# Patient Record
Sex: Male | Born: 1960 | Race: White | Hispanic: No | Marital: Married | Smoking: Never smoker
Health system: Southern US, Community
[De-identification: ages and names within clinical notes are randomized; demographics above are authoritative.]

## PROBLEM LIST (undated history)

## (undated) DIAGNOSIS — G510 Bell's palsy: Secondary | ICD-10-CM

## (undated) DIAGNOSIS — E079 Disorder of thyroid, unspecified: Secondary | ICD-10-CM

---

## 2015-04-13 ENCOUNTER — Emergency Department (HOSPITAL_COMMUNITY): Payer: BLUE CROSS/BLUE SHIELD

## 2015-04-13 ENCOUNTER — Observation Stay (HOSPITAL_COMMUNITY)
Admission: EM | Admit: 2015-04-13 | Discharge: 2015-04-15 | Disposition: A | Discharge status: Home / Self Care | Payer: BLUE CROSS/BLUE SHIELD | Attending: Internal Medicine | Admitting: Internal Medicine

## 2015-04-13 ENCOUNTER — Encounter (HOSPITAL_COMMUNITY): Payer: Self-pay | Admitting: Emergency Medicine

## 2015-04-13 DIAGNOSIS — I10 Essential (primary) hypertension: Secondary | ICD-10-CM | POA: Insufficient documentation

## 2015-04-13 DIAGNOSIS — W19XXXA Unspecified fall, initial encounter: Secondary | ICD-10-CM | POA: Diagnosis not present

## 2015-04-13 DIAGNOSIS — K644 Residual hemorrhoidal skin tags: Secondary | ICD-10-CM | POA: Diagnosis not present

## 2015-04-13 DIAGNOSIS — R739 Hyperglycemia, unspecified: Secondary | ICD-10-CM | POA: Insufficient documentation

## 2015-04-13 DIAGNOSIS — R55 Syncope and collapse: Principal | ICD-10-CM | POA: Insufficient documentation

## 2015-04-13 DIAGNOSIS — R680 Hypothermia, not associated with low environmental temperature: Secondary | ICD-10-CM | POA: Diagnosis not present

## 2015-04-13 DIAGNOSIS — J189 Pneumonia, unspecified organism: Secondary | ICD-10-CM | POA: Diagnosis not present

## 2015-04-13 DIAGNOSIS — E785 Hyperlipidemia, unspecified: Secondary | ICD-10-CM | POA: Insufficient documentation

## 2015-04-13 DIAGNOSIS — R4781 Slurred speech: Secondary | ICD-10-CM | POA: Insufficient documentation

## 2015-04-13 DIAGNOSIS — R531 Weakness: Secondary | ICD-10-CM | POA: Insufficient documentation

## 2015-04-13 DIAGNOSIS — G459 Transient cerebral ischemic attack, unspecified: Secondary | ICD-10-CM

## 2015-04-13 HISTORY — DX: Bell's palsy: G51.0

## 2015-04-13 LAB — URINALYSIS, ROUTINE W REFLEX MICROSCOPIC
Bilirubin Urine: NEGATIVE
Glucose, UA: 100 mg/dL — AB
KETONES UR: NEGATIVE mg/dL
LEUKOCYTES UA: NEGATIVE
NITRITE: NEGATIVE
Specific Gravity, Urine: 1.015 (ref 1.005–1.030)
pH: 6 (ref 5.0–8.0)

## 2015-04-13 LAB — CBC WITH DIFFERENTIAL/PLATELET
BASOS ABS: 0.1 10*3/uL (ref 0.0–0.1)
BASOS PCT: 1 %
Eosinophils Absolute: 0.2 10*3/uL (ref 0.0–0.7)
Eosinophils Relative: 1 %
HEMATOCRIT: 42 % (ref 39.0–52.0)
HEMOGLOBIN: 14.2 g/dL (ref 13.0–17.0)
Lymphocytes Relative: 28 %
Lymphs Abs: 3.9 10*3/uL (ref 0.7–4.0)
MCH: 31.1 pg (ref 26.0–34.0)
MCHC: 33.8 g/dL (ref 30.0–36.0)
MCV: 91.9 fL (ref 78.0–100.0)
Monocytes Absolute: 0.5 10*3/uL (ref 0.1–1.0)
Monocytes Relative: 4 %
NEUTROS ABS: 9.3 10*3/uL — AB (ref 1.7–7.7)
NEUTROS PCT: 67 %
Platelets: 237 10*3/uL (ref 150–400)
RBC: 4.57 MIL/uL (ref 4.22–5.81)
RDW: 14.2 % (ref 11.5–15.5)
WBC: 13.9 10*3/uL — AB (ref 4.0–10.5)

## 2015-04-13 LAB — COMPREHENSIVE METABOLIC PANEL
ALBUMIN: 4.1 g/dL (ref 3.5–5.0)
ALT: 52 U/L (ref 17–63)
ALT: 54 U/L (ref 17–63)
ANION GAP: 9 (ref 5–15)
AST: 60 U/L — AB (ref 15–41)
AST: 145 U/L — AB (ref 15–41)
Albumin: 4.3 g/dL (ref 3.5–5.0)
Alkaline Phosphatase: 44 U/L (ref 38–126)
Alkaline Phosphatase: 40 U/L (ref 38–126)
Anion gap: 13 (ref 5–15)
BILIRUBIN TOTAL: 0.6 mg/dL (ref 0.3–1.2)
BUN: 19 mg/dL (ref 6–20)
BUN: 21 mg/dL — AB (ref 6–20)
CALCIUM: 8.9 mg/dL (ref 8.9–10.3)
CHLORIDE: 101 mmol/L (ref 101–111)
CO2: 21 mmol/L — ABNORMAL LOW (ref 22–32)
CO2: 26 mmol/L (ref 22–32)
Calcium: 8.8 mg/dL — ABNORMAL LOW (ref 8.9–10.3)
Chloride: 103 mmol/L (ref 101–111)
Creatinine, Ser: 1.29 mg/dL — ABNORMAL HIGH (ref 0.61–1.24)
Creatinine, Ser: 1.2 mg/dL (ref 0.61–1.24)
GFR calc Af Amer: >60 mL/min (ref >60)
GFR calc Af Amer: >60 mL/min (ref >60)
GFR calc non Af Amer: >60 mL/min (ref >60)
GLUCOSE: 204 mg/dL — AB (ref 65–99)
GLUCOSE: 120 mg/dL — AB (ref 65–99)
POTASSIUM: 4.2 mmol/L (ref 3.5–5.1)
Potassium: 4.1 mmol/L (ref 3.5–5.1)
SODIUM: 136 mmol/L (ref 135–145)
Sodium: 137 mmol/L (ref 135–145)
TOTAL PROTEIN: 6.6 g/dL (ref 6.5–8.1)
Total Bilirubin: 0.9 mg/dL (ref 0.3–1.2)
Total Protein: 6.3 g/dL — ABNORMAL LOW (ref 6.5–8.1)

## 2015-04-13 LAB — ETHANOL: Alcohol, Ethyl (B): <5 mg/dL (ref <5)

## 2015-04-13 LAB — I-STAT CHEM 8, ED
BUN: 23 mg/dL — ABNORMAL HIGH (ref 6–20)
Calcium, Ion: 1.03 mmol/L — ABNORMAL LOW (ref 1.12–1.23)
Chloride: 102 mmol/L (ref 101–111)
Creatinine, Ser: 1.2 mg/dL (ref 0.61–1.24)
Glucose, Bld: 196 mg/dL — ABNORMAL HIGH (ref 65–99)
HEMATOCRIT: 44 % (ref 39.0–52.0)
HEMOGLOBIN: 15 g/dL (ref 13.0–17.0)
Potassium: 3.9 mmol/L (ref 3.5–5.1)
SODIUM: 137 mmol/L (ref 135–145)
TCO2: 22 mmol/L (ref 0–100)

## 2015-04-13 LAB — CBC
HCT: 38.6 % — ABNORMAL LOW (ref 39.0–52.0)
Hemoglobin: 13.1 g/dL (ref 13.0–17.0)
MCH: 30.8 pg (ref 26.0–34.0)
MCHC: 33.9 g/dL (ref 30.0–36.0)
MCV: 90.6 fL (ref 78.0–100.0)
Platelets: 188 10*3/uL (ref 150–400)
RBC: 4.26 MIL/uL (ref 4.22–5.81)
RDW: 14.1 % (ref 11.5–15.5)
WBC: 15.4 10*3/uL — AB (ref 4.0–10.5)

## 2015-04-13 LAB — URINE MICROSCOPIC-ADD ON: Bacteria, UA: NONE SEEN

## 2015-04-13 LAB — RAPID URINE DRUG SCREEN, HOSP PERFORMED
Amphetamines: NOT DETECTED
BARBITURATES: NOT DETECTED
Benzodiazepines: NOT DETECTED
Cocaine: NOT DETECTED
Opiates: NOT DETECTED
TETRAHYDROCANNABINOL: NOT DETECTED

## 2015-04-13 LAB — D-DIMER, QUANTITATIVE: D-Dimer, Quant: 0.28 ug/mL-FEU (ref 0.00–0.50)

## 2015-04-13 LAB — I-STAT TROPONIN, ED: Troponin i, poc: 0.05 ng/mL (ref 0.00–0.08)

## 2015-04-13 LAB — APTT: aPTT: 32 seconds (ref 24–37)

## 2015-04-13 LAB — PROTIME-INR
INR: 1.16 (ref 0.00–1.49)
PROTHROMBIN TIME: 15 s (ref 11.6–15.2)

## 2015-04-13 LAB — CBG MONITORING, ED: Glucose-Capillary: 144 mg/dL — ABNORMAL HIGH (ref 65–99)

## 2015-04-13 MED ORDER — AZITHROMYCIN 500 MG PO TABS
500.0000 mg | ORAL_TABLET | ORAL | Status: DC
  Administered 2015-04-13 – 2015-04-14 (×2): 500 mg via ORAL
  Filled 2015-04-13 (×2): qty 1

## 2015-04-13 MED ORDER — ONDANSETRON HCL 4 MG/2ML IJ SOLN
4.0000 mg | Freq: Once | INTRAMUSCULAR | Status: AC
  Administered 2015-04-13: 4 mg via INTRAVENOUS
  Filled 2015-04-13: qty 2

## 2015-04-13 MED ORDER — LORAZEPAM 2 MG/ML IJ SOLN
0.5000 mg | Freq: Once | INTRAMUSCULAR | Status: AC
  Administered 2015-04-13: 0.5 mg via INTRAVENOUS
  Filled 2015-04-13: qty 1

## 2015-04-13 MED ORDER — ACETAMINOPHEN 650 MG RE SUPP: 650.0000 mg | RECTAL | Status: DC | PRN

## 2015-04-13 MED ORDER — ACETAMINOPHEN 325 MG PO TABS: 650.0000 mg | ORAL_TABLET | ORAL | Status: DC | PRN

## 2015-04-13 MED ORDER — ASPIRIN 325 MG PO TABS
325.0000 mg | ORAL_TABLET | Freq: Every day | ORAL | Status: DC
  Administered 2015-04-14 – 2015-04-15 (×3): 325 mg via ORAL
  Filled 2015-04-13 (×3): qty 1

## 2015-04-13 MED ORDER — ENOXAPARIN SODIUM 40 MG/0.4ML ~~LOC~~ SOLN
40.0000 mg | SUBCUTANEOUS | Status: DC
  Administered 2015-04-13 – 2015-04-14 (×2): 40 mg via SUBCUTANEOUS
  Filled 2015-04-13 (×2): qty 0.4

## 2015-04-13 MED ORDER — DEXTROSE 5 % IV SOLN
1.0000 g | INTRAVENOUS | Status: DC
  Administered 2015-04-14 (×2): 1 g via INTRAVENOUS
  Filled 2015-04-13 (×3): qty 10

## 2015-04-13 MED ORDER — ASPIRIN 300 MG RE SUPP: 300.0000 mg | Freq: Every day | RECTAL | Status: DC

## 2015-04-13 MED ORDER — STROKE: EARLY STAGES OF RECOVERY BOOK
Freq: Once | Status: AC
  Administered 2015-04-13: 22:00:00
  Filled 2015-04-13: qty 1

## 2015-04-13 MED ORDER — SODIUM CHLORIDE 0.9 % IV BOLUS (SEPSIS)
1000.0000 mL | Freq: Once | INTRAVENOUS | Status: AC
  Administered 2015-04-14: 1000 mL via INTRAVENOUS

## 2015-04-13 NOTE — H&P (Signed)
History and Physical  Patient Name: Connor Vargas     ZOX:[id number]    DOB: 1960-09-06    DOA: 04/13/2015 Referring physician: Stacy Gardner, MD PCP: Kaleen Mask, MD      Chief Complaint: Syncope and L sided weakness transient  HPI: Connor Vargas is a 55 y.o. male with no past medical history significant who presents with fall.  Translator is deferred and all history is collected from the patient or through a family friend, who is a hospitalist. The patient is healthy at baseline but notes that he has spells occasionally, over >1 year of exertional weakness, starting at the extremities and progressing to global weakness.  These are not associated with chest pressure/discomfort, dyspnea, or palpitations, but are relieved with rest.  They appear to be accelerating in frequency.  1 month ago, he had an episode and lost consciousness completely (there was prodrome of feeling weak, then he blacked out, and came back shortly after).  Today, he had had a heavy breakfast and felt some abdominal discomfort/bloating/gas.  He went out to the car to clear off snow, and started to get weak, then the next thing he remembers is waking up calling for his wife.  Wife reports ~15 minutes before he was seen, and called EMS.  When EMS arrived, he was still down, had L sided leg and arm weakness, and slurred speech and blood in nares.  Was hypothermic and transported to ED>  In the ED, the patient was hypertensive and hypothermic. His strength was symmetric on arrival. He had leukocytosis, and chest x-ray with questionable R sided infiltrate, and had brief desaturation into 80s.  A CT head was negative, and patient was seen by Neurology, who recommended stroke work up for transient reported L sided weakness.  He denies cough, fever before today, sputum, chills, dyspnea.  Patient takes no medicines at baseline, but has medical follow up.  No previous allergies.  Mother with premature CAD.       Review of  Systems:  Pt complains of weakness, nausea, abdominal discomfort, bloating, fainting spells, increasing in frequency.  Initially complained of L sided weakness.   Pt denies any dyspnea, cough, sputum.  Denies palpitations, chest discomfort.  All other systems negative except as just noted or noted in the history of present illness.   Allergies: No Known Allergies  Home medications: Acetaminophen occasionally  Past medical history: 1. Bell's palsy, years ago  Past surgical history: None  Family history:  Mother, deceased from MI age 53.    Social History:  Patient lives with his wife and son.  No recent sick contacts.  Works in [employer].  From [place], speaks some English.  Non-smoker.  Occasional alcohol.        Physical Exam: BP 134/86 mmHg  Pulse 81  Temp(Src) 97.6 F (36.4 C) (Rectal)  Resp 12  SpO2 97% General appearance: Well-developed, adult male, alert and in no acute distress but tired.   Eyes: Anicteric, conjunctiva pink, lids and lashes normal.     ENT: No nasal deformity, discharge, or epistaxis at this time.  OP moist without lesions.   Skin: Warm and dry.  Dry flaky skin.  No jaundice.  No suspicious rashes or lesions on face, neck, chest, abdomen, arms, legs, back. Cardiac: RRR, nl S1-S2, no murmurs appreciated.  Capillary refill is brisk.  No JVD.  Radial pulses 2+ and symmetric. Respiratory: Normal respiratory rate and rhythm.  CTAB without rales or wheezes. Abdomen: Abdomen soft  without rigidity.  No TTP or guarding.  No HSM. No ascites, distension.   MSK: No deformities or effusions. Neuro: Sensorium intact and responding to questions, attention normal.  Speech appears fluent.  Moves all extremities equally and with normal coordination.   Cranial nerves normal.  Strength 5/5 in upper and lower extremities, bilaterally. Psych: Behavior appropriate.  Affect normal.  No evidence of aural or visual hallucinations or  delusions.       Labs on Admission:  The metabolic panel shows normal electrolytes.  The creatinine is 1.29 mg/dL, no baseline.  Slight hyperglycemia, 204 mg/dL. AST somewhat elevated just above ULN. Leukocytosis but no anemia or thrombocytiopenia. APTT and INR normal. Alcohol negative. UA shows casts and proteinuria, but no RBC or WBC.     Radiological Exams on Admission: Personally reviewed: Dg Chest Port 1 View 04/13/2015 Possible RLL consolidation.   Ct Head Wo Contrast and Ct Cervical Spine Wo Contrast 04/13/2015   IMPRESSION: CT of the head:  No acute abnormality noted. CT of the cervical spine:  No acute abnormality noted. Electronically Signed   By: Alcide CleverMark  Lukens M.D.   On: 04/13/2015 19:14     EKG: Independently reviewed. Rate 78, sinus.  Poor R wave progression.  QRS normal.  PR normal.  QTc 474.  No STTW changes.    Assessment/Plan 1. Transient L sided Weakness:  This is new.  The report of hemiparesis was confirmed by EMS, but was transient.  The differential also may include seizure, hypothermia and syncope. -MRI brain -Admit to telemetry -Neuro checks, NIHSS per protocol -Daily aspirin 325 mg -Lipids, hemoglobin A1c -Carotid doppler and echocardiogram ordered -Consult to Neurology, appreciate recommendations   2. Syncope, accelerating:  Accelerating exertional weakness without chest pain.  The patient has risk factors for CAD only of family history (mother) and obesity.  ECG non-specific. -Lipids and HgbA1c -Echocardiogram as above -Telemetry  3. AKI versus mild CKD:  Unclear baseline.  UA with casts. -Fluid bolus once and repeat BMP  4. Elevated AST:  Likely incidental. -Confirm with repeat.  5. CAP:  Leukocytosis, mild hypoxia, and infiltrate on x-ray in setting of possible syncopal episode. -Ceftriaxone and azithromycin -S pneumo antigen is ordered     DVT PPx: Lovenox Diet: Regular Consultants: None Code Status: Full Family  Communication: Wife and son at bedside.  TIA workup and CAP treatment discussed.  All questions answered. Medical decision making: What exists of the patient's previous chart was reviewed in depth and the case was discussed with Dr. Lonia MadSeymore and Dr. Madilyn Hookees and Dr. Leroy Kennedyamilo. Patient seen 9:04 PM on 04/13/2015.  Disposition Plan:  Admit to tele for observation, MRI, echo and empiric CAP treatment.      Alberteen SamChristopher P Emilya Justen [facility] Pager [phone]

## 2015-04-13 NOTE — ED Notes (Signed)
Dr Pecola Leisureeese at bedside, speaking with friend of family who also practices medicine with [employer].

## 2015-04-13 NOTE — ED Notes (Signed)
Received pt from home via EMS with c/o found down outside. Unclear how long pt was outside. Pt last remembers cleaning his car. Per son pt has some alcohol earlier today. For EMS pt was unable to look to his right side, left side was flacid and pt had slurred speech. Pt repeats, "I am cold, I am freezing." Bruising to B/L knees noted, hands, fingers, feet and toes all purple in color. Dried blood noted to B/L nares.

## 2015-04-13 NOTE — Consult Note (Signed)
NEURO HOSPITALIST CONSULT NOTE   Referring physician: ED Reason for Consult: fall with transient alteration of consciousness and left sided weakness  HPI:                                                                                                                                          Connor Vargas is an 55 y.o. male with a past medical history significant for HTN, and " passing out" episodes (last episode approximately 1 moth ago), brought to the ED via EMS after being found down outside. Patient has not recollection of the entire episode and last remembers cleaning his car. He said that he recalls " his body losing strenght , very weak muscles, then going down to the ground". Patient said that he regained consciousness rapidly but family can not corroborate this as he was outside for approximately 20 minutes. No bladder or bowel involvement. EMS was summoned and stated that when they found him the left side was flaccid, was unable to look to his right side, and had slurred speech. Pt repeats, "I am cold, I am freezing." Bruising to B/L knees noted, hands, fingers, feet and toes all purple in color. Hypothermic 94 degrees. Presently, denies HA, vertigo, double vision, focal weakness, slurred speech, language or vision impairment. CT brain was personally reviewed and showed no acute abnormality. Serologies including alcohol level were unremarkable. Of note, patient indicated that he had had previous episodes with similar characteristics. History reviewed. No pertinent past medical history.  History reviewed. No pertinent past surgical history.  No family history on file.  Family History: no MS, epilepsy, or brain tumor   Social History:  reports that he has never smoked. He does not have any smokeless tobacco history on file. He reports that he drinks alcohol. He reports that he does not use illicit drugs.  No Known Allergies  MEDICATIONS:                                                                                                                      I have reviewed the patient's current medications.   ROS:  History obtained from chart review and the patient  General ROS: negative for - chills, fatigue, fever, night sweats, weight gain or weight loss Psychological ROS: negative for - behavioral disorder, hallucinations, memory difficulties, mood swings or suicidal ideation Ophthalmic ROS: negative for - blurry vision, double vision, eye pain or loss of vision ENT ROS: negative for - epistaxis, nasal discharge, oral lesions, sore throat, tinnitus or vertigo Allergy and Immunology ROS: negative for - hives or itchy/watery eyes Hematological and Lymphatic ROS: negative for - bleeding problems, bruising or swollen lymph nodes Endocrine ROS: negative for - galactorrhea, hair pattern changes, polydipsia/polyuria or temperature intolerance Respiratory ROS: negative for - cough, hemoptysis, shortness of breath or wheezing Cardiovascular ROS: negative for - chest pain, dyspnea on exertion, edema or irregular heartbeat Gastrointestinal ROS: negative for - abdominal pain, diarrhea, hematemesis, nausea/vomiting or stool incontinence Genito-Urinary ROS: negative for - dysuria, hematuria, incontinence or urinary frequency/urgency Musculoskeletal ROS: negative for - joint swelling or muscular weakness Neurological ROS: as noted in HPI Dermatological ROS: negative for rash and skin lesion changes   Physical exam:  Constitutional: well developed, pleasant male in no apparent distress. Blood pressure 133/93, pulse 89, resp. rate 20, SpO2 100 %. Eyes: no jaundice or exophthalmos.  Head: normocephalic. Neck: supple, no bruits, no JVD. Cardiac: no murmurs. Lungs: clear. Abdomen: soft, no tender, no mass. Extremities: no edema,  clubbing, or cyanosis.  Skin: no rash  Neurologic Examination:                                                                                                      General: NAD Mental Status: Alert, oriented, thought content appropriate.  Speech fluent without evidence of aphasia.  Able to follow 3 step commands without difficulty. Cranial Nerves: II: Discs flat bilaterally; Visual fields grossly normal, pupils equal, round, reactive to light and accommodation III,IV, VI: ptosis not present, extra-ocular motions intact bilaterally V,VII: smile symmetric, facial light touch sensation normal bilaterally VIII: hearing normal bilaterally IX,X: uvula rises symmetrically XI: bilateral shoulder shrug XII: midline tongue extension without atrophy or fasciculations  Motor: Right : Upper extremity   5/5    Left:     Upper extremity   5/5  Lower extremity   5/5     Lower extremity   5/5 Tone and bulk:normal tone throughout; no atrophy noted Sensory: Pinprick and light touch intact throughout, bilaterally Deep Tendon Reflexes:  Right: Upper Extremity   Left: Upper extremity   biceps (C-5 to C-6) 2/4   biceps (C-5 to C-6) 2/4 tricep (C7) 2/4    triceps (C7) 2/4 Brachioradialis (C6) 2/4  Brachioradialis (C6) 2/4  Lower Extremity Lower Extremity  quadriceps (L-2 to L-4) 2/4   quadriceps (L-2 to L-4) 2/4 Achilles (S1) 2/4   Achilles (S1) 2/4  Plantars: Right: downgoing   Left: downgoing Cerebellar: normal finger-to-nose,  normal heel-to-shin test Gait:  No tested due to multiple leads     No results found for: CHOL  Results for orders placed or performed during the hospital encounter of 04/13/15 (from the past 48 hour(s))  CBG monitoring, ED     Status: Abnormal   Collection Time: 04/13/15  6:40 PM  Result Value Ref Range   Glucose-Capillary 144 (H) 65 - 99 mg/dL  Ethanol     Status: None   Collection Time: 04/13/15  6:53 PM  Result Value Ref Range   Alcohol, Ethyl (B) <5 <5 mg/dL     Comment:        LOWEST DETECTABLE LIMIT FOR SERUM ALCOHOL IS 5 mg/dL FOR MEDICAL PURPOSES ONLY   Protime-INR     Status: None   Collection Time: 04/13/15  6:53 PM  Result Value Ref Range   Prothrombin Time 15.0 11.6 - 15.2 seconds   INR 1.16 0.00 - 1.49  APTT     Status: None   Collection Time: 04/13/15  6:53 PM  Result Value Ref Range   aPTT 32 24 - 37 seconds  Comprehensive metabolic panel     Status: Abnormal   Collection Time: 04/13/15  6:53 PM  Result Value Ref Range   Sodium 137 135 - 145 mmol/L   Potassium 4.1 3.5 - 5.1 mmol/L   Chloride 103 101 - 111 mmol/L   CO2 21 (L) 22 - 32 mmol/L   Glucose, Bld 204 (H) 65 - 99 mg/dL   BUN 19 6 - 20 mg/dL   Creatinine, Ser 1.29 (H) 0.61 - 1.24 mg/dL   Calcium 8.9 8.9 - 10.3 mg/dL   Total Protein 6.6 6.5 - 8.1 g/dL   Albumin 4.3 3.5 - 5.0 g/dL   AST 60 (H) 15 - 41 U/L   ALT 52 17 - 63 U/L   Alkaline Phosphatase 44 38 - 126 U/L   Total Bilirubin 0.6 0.3 - 1.2 mg/dL   GFR calc non Af Amer >60 >60 mL/min   GFR calc Af Amer >60 >60 mL/min    Comment: (NOTE) The eGFR has been calculated using the CKD EPI equation. This calculation has not been validated in all clinical situations. eGFR's persistently <60 mL/min signify possible Chronic Kidney Disease.    Anion gap 13 5 - 15  CBC WITH DIFFERENTIAL     Status: Abnormal   Collection Time: 04/13/15  6:53 PM  Result Value Ref Range   WBC 13.9 (H) 4.0 - 10.5 K/uL   RBC 4.57 4.22 - 5.81 MIL/uL   Hemoglobin 14.2 13.0 - 17.0 g/dL   HCT 42.0 39.0 - 52.0 %   MCV 91.9 78.0 - 100.0 fL   MCH 31.1 26.0 - 34.0 pg   MCHC 33.8 30.0 - 36.0 g/dL   RDW 14.2 11.5 - 15.5 %   Platelets 237 150 - 400 K/uL   Neutrophils Relative % 67 %   Neutro Abs 9.3 (H) 1.7 - 7.7 K/uL   Lymphocytes Relative 28 %   Lymphs Abs 3.9 0.7 - 4.0 K/uL   Monocytes Relative 4 %   Monocytes Absolute 0.5 0.1 - 1.0 K/uL   Eosinophils Relative 1 %   Eosinophils Absolute 0.2 0.0 - 0.7 K/uL   Basophils Relative 1 %    Basophils Absolute 0.1 0.0 - 0.1 K/uL  I-stat troponin, ED (not at [facility], [facility])     Status: None   Collection Time: 04/13/15  6:59 PM  Result Value Ref Range   Troponin i, poc 0.05 0.00 - 0.08 ng/mL   Comment 3            Comment: Due to the release kinetics of cTnI, a negative result within the first hours of  the onset of symptoms does not rule out myocardial infarction with certainty. If myocardial infarction is still suspected, repeat the test at appropriate intervals.   I-Stat Chem 8, ED  (not at [facility], [facility])     Status: Abnormal   Collection Time: 04/13/15  7:01 PM  Result Value Ref Range   Sodium 137 135 - 145 mmol/L   Potassium 3.9 3.5 - 5.1 mmol/L   Chloride 102 101 - 111 mmol/L   BUN 23 (H) 6 - 20 mg/dL   Creatinine, Ser 1.20 0.61 - 1.24 mg/dL   Glucose, Bld 196 (H) 65 - 99 mg/dL   Calcium, Ion 1.03 (L) 1.12 - 1.23 mmol/L   TCO2 22 0 - 100 mmol/L   Hemoglobin 15.0 13.0 - 17.0 g/dL   HCT 44.0 39.0 - 52.0 %  Urine rapid drug screen (hosp performed)not at [facility]     Status: None   Collection Time: 04/13/15  7:23 PM  Result Value Ref Range   Opiates NONE DETECTED NONE DETECTED   Cocaine NONE DETECTED NONE DETECTED   Benzodiazepines NONE DETECTED NONE DETECTED   Amphetamines NONE DETECTED NONE DETECTED   Tetrahydrocannabinol NONE DETECTED NONE DETECTED   Barbiturates NONE DETECTED NONE DETECTED    Comment:        DRUG SCREEN FOR MEDICAL PURPOSES ONLY.  IF CONFIRMATION IS NEEDED FOR ANY PURPOSE, NOTIFY LAB WITHIN 5 DAYS.        LOWEST DETECTABLE LIMITS FOR URINE DRUG SCREEN Drug Class       Cutoff (ng/mL) Amphetamine      1000 Barbiturate      200 Benzodiazepine   330 Tricyclics       076 Opiates          300 Cocaine          300 THC              50   Urinalysis, Routine w reflex microscopic (not at [facility])     Status: Abnormal   Collection Time: 04/13/15  7:23 PM  Result Value Ref Range   Color, Urine YELLOW YELLOW   APPearance CLEAR CLEAR   Specific Gravity,  Urine 1.015 1.005 - 1.030   pH 6.0 5.0 - 8.0   Glucose, UA 100 (A) NEGATIVE mg/dL   Hgb urine dipstick MODERATE (A) NEGATIVE   Bilirubin Urine NEGATIVE NEGATIVE   Ketones, ur NEGATIVE NEGATIVE mg/dL   Protein, ur >300 (A) NEGATIVE mg/dL   Nitrite NEGATIVE NEGATIVE   Leukocytes, UA NEGATIVE NEGATIVE  Urine microscopic-add on     Status: Abnormal   Collection Time: 04/13/15  7:23 PM  Result Value Ref Range   Squamous Epithelial / LPF 0-5 (A) NONE SEEN   WBC, UA 0-5 0 - 5 WBC/hpf   RBC / HPF 0-5 0 - 5 RBC/hpf   Bacteria, UA NONE SEEN NONE SEEN   Casts HYALINE CASTS (A) NEGATIVE    Ct Head Wo Contrast  04/13/2015  CLINICAL DATA:  Found unresponsive in snow, initial encounter EXAM: CT HEAD WITHOUT CONTRAST CT CERVICAL SPINE WITHOUT CONTRAST TECHNIQUE: Multidetector CT imaging of the head and cervical spine was performed following the standard protocol without intravenous contrast. Multiplanar CT image reconstructions of the cervical spine were also generated. COMPARISON:  None. FINDINGS: CT HEAD FINDINGS The bony calvarium is intact. The ventricles are of normal size and configuration. No findings to suggest acute hemorrhage, acute infarction or space-occupying mass lesion are noted. CT CERVICAL SPINE FINDINGS The examination is somewhat  degraded by patient motion artifact. Seven cervical segments are well visualized. Vertebral body height is well maintained. The surrounding soft tissues are within normal limits. No acute fracture or acute facet abnormality is seen. IMPRESSION: CT of the head:  No acute abnormality noted. CT of the cervical spine:  No acute abnormality noted. Electronically Signed   By: Inez Catalina M.D.   On: 04/13/2015 19:14   Ct Cervical Spine Wo Contrast  04/13/2015  CLINICAL DATA:  Found unresponsive in snow, initial encounter EXAM: CT HEAD WITHOUT CONTRAST CT CERVICAL SPINE WITHOUT CONTRAST TECHNIQUE: Multidetector CT imaging of the head and cervical spine was performed  following the standard protocol without intravenous contrast. Multiplanar CT image reconstructions of the cervical spine were also generated. COMPARISON:  None. FINDINGS: CT HEAD FINDINGS The bony calvarium is intact. The ventricles are of normal size and configuration. No findings to suggest acute hemorrhage, acute infarction or space-occupying mass lesion are noted. CT CERVICAL SPINE FINDINGS The examination is somewhat degraded by patient motion artifact. Seven cervical segments are well visualized. Vertebral body height is well maintained. The surrounding soft tissues are within normal limits. No acute fracture or acute facet abnormality is seen. IMPRESSION: CT of the head:  No acute abnormality noted. CT of the cervical spine:  No acute abnormality noted. Electronically Signed   By: Inez Catalina M.D.   On: 04/13/2015 19:14   Dg Chest Port 1 View  04/13/2015  CLINICAL DATA:  Altered mental status, found down, slurred speech EXAM: PORTABLE CHEST 1 VIEW COMPARISON:  None. FINDINGS: Heart size normal allowing for technique and very limited inspiratory effect. Mild elevation of the right diaphragm. Mild infiltrate or atelectasis in the right lung base. Lungs otherwise clear. Bony thorax intact. IMPRESSION: Mild infiltrate versus atelectasis right lower lobe. Electronically Signed   By: Skipper Cliche M.D.   On: 04/13/2015 20:11    Assessment/Plan: 55 y/o brought in by EMS after sustaining a fall with brief alteration of consciousness and reported left flaccid weakness with slurred speech. Hypothermic 94 degrees in the ED, but patient said that he had had similar episodes of " getting weak all over and passing out" which he often is able to abort by siting down. Syncope, TIA, less likely seizure in the differential. Recommend TIA work up, EEG. Will follow up after testing completed.  Dorian Pod, MD 04/13/2015, 8:18 PM  [facility]

## 2015-04-13 NOTE — ED Notes (Signed)
Dr. Camillo at bedside. 

## 2015-04-13 NOTE — ED Provider Notes (Signed)
CSN: [id number]     Arrival date & time 04/13/15  1834 History   First MD Initiated Contact with Patient 04/13/15 1841     Chief Complaint  Patient presents with  . Fall  . Cold Exposure     (Consider location/radiation/quality/duration/timing/severity/associated sxs/prior Treatment) Patient is a 55 y.o. male presenting with fall. The history is provided by the patient.  Fall This is a new problem. The current episode started today. The problem occurs constantly. The problem has been gradually improving. Associated symptoms include chills, myalgias, nausea and weakness. Pertinent negatives include no abdominal pain, anorexia, arthralgias, change in bowel habit, chest pain, congestion, coughing, diaphoresis, fatigue, fever, headaches, joint swelling, neck pain, numbness, rash, sore throat, visual change or vomiting. Nothing aggravates the symptoms. He has tried nothing for the symptoms. The treatment provided no relief.    Past Medical History  Diagnosis Date  . Bell's palsy     1980   History reviewed. No pertinent past surgical history. Family History  Problem Relation Age of Onset  . Coronary artery disease Mother 38   Social History  Substance Use Topics  . Smoking status: Never Smoker   . Smokeless tobacco: None  . Alcohol Use: Yes    Review of Systems  Constitutional: Positive for chills. Negative for fever, diaphoresis and fatigue.  HENT: Negative for congestion and sore throat.   Eyes: Negative for pain.  Respiratory: Positive for shortness of breath. Negative for cough.   Cardiovascular: Negative for chest pain.  Gastrointestinal: Positive for nausea. Negative for vomiting, abdominal pain, anorexia and change in bowel habit.  Genitourinary: Negative for dysuria.  Musculoskeletal: Positive for myalgias. Negative for joint swelling, arthralgias and neck pain.  Skin: Negative for rash.  Neurological: Positive for syncope and weakness. Negative for dizziness, facial  asymmetry, light-headedness, numbness and headaches.      Allergies  Review of patient's allergies indicates no known allergies.  Home Medications   Prior to Admission medications   Not on File   BP 134/86 mmHg  Pulse 81  Temp(Src) 97.6 F (36.4 C) (Rectal)  Resp 12  SpO2 97% Physical Exam  Constitutional: He appears well-developed and well-nourished. He has a sickly appearance. He appears ill.  HENT:  Head: Head is without abrasion and without contusion.  Mouth/Throat: Oropharynx is clear and moist.  Eyes: Conjunctivae and EOM are normal. Pupils are equal, round, and reactive to light.  Neck: Normal range of motion.  Cardiovascular: Normal rate and regular rhythm.  Exam reveals no decreased pulses.   No murmur heard. Pulmonary/Chest: Effort normal and breath sounds normal.  Abdominal: Normal appearance. He exhibits no distension and no mass. There is no tenderness. There is no rigidity, no guarding and no CVA tenderness.  Neurological: He is alert. He has normal strength. No cranial nerve deficit or sensory deficit. Coordination normal. GCS eye subscore is 4. GCS verbal subscore is 5. GCS motor subscore is 6.  Skin: No rash noted.  Psychiatric: He has a normal mood and affect. His speech is normal and behavior is normal.    ED Course  Procedures (including critical care time) Labs Review Labs Reviewed  COMPREHENSIVE METABOLIC PANEL - Abnormal; Notable for the following:    CO2 21 (*)    Glucose, Bld 204 (*)    Creatinine, Ser 1.29 (*)    AST 60 (*)    All other components within normal limits  URINALYSIS, ROUTINE W REFLEX MICROSCOPIC (NOT AT [facility]) - Abnormal; Notable for the following:  Glucose, UA 100 (*)    Hgb urine dipstick MODERATE (*)    Protein, ur >300 (*)    All other components within normal limits  CBC WITH DIFFERENTIAL/PLATELET - Abnormal; Notable for the following:    WBC 13.9 (*)    Neutro Abs 9.3 (*)    All other components within normal limits   URINE MICROSCOPIC-ADD ON - Abnormal; Notable for the following:    Squamous Epithelial / LPF 0-5 (*)    Casts HYALINE CASTS (*)    All other components within normal limits  CBG MONITORING, ED - Abnormal; Notable for the following:    Glucose-Capillary 144 (*)    All other components within normal limits  I-STAT CHEM 8, ED - Abnormal; Notable for the following:    BUN 23 (*)    Glucose, Bld 196 (*)    Calcium, Ion 1.03 (*)    All other components within normal limits  ETHANOL  PROTIME-INR  APTT  URINE RAPID DRUG SCREEN, HOSP PERFORMED  D-DIMER, QUANTITATIVE (NOT AT [facility])  DIFFERENTIAL  STREP PNEUMONIAE URINARY ANTIGEN  CBC  HEMOGLOBIN A1C  LIPID PANEL  COMPREHENSIVE METABOLIC PANEL  I-STAT TROPOININ, ED    Imaging Review Ct Head Wo Contrast  04/13/2015  CLINICAL DATA:  Found unresponsive in snow, initial encounter EXAM: CT HEAD WITHOUT CONTRAST CT CERVICAL SPINE WITHOUT CONTRAST TECHNIQUE: Multidetector CT imaging of the head and cervical spine was performed following the standard protocol without intravenous contrast. Multiplanar CT image reconstructions of the cervical spine were also generated. COMPARISON:  None. FINDINGS: CT HEAD FINDINGS The bony calvarium is intact. The ventricles are of normal size and configuration. No findings to suggest acute hemorrhage, acute infarction or space-occupying mass lesion are noted. CT CERVICAL SPINE FINDINGS The examination is somewhat degraded by patient motion artifact. Seven cervical segments are well visualized. Vertebral body height is well maintained. The surrounding soft tissues are within normal limits. No acute fracture or acute facet abnormality is seen. IMPRESSION: CT of the head:  No acute abnormality noted. CT of the cervical spine:  No acute abnormality noted. Electronically Signed   By: Alcide Clever M.D.   On: 04/13/2015 19:14   Ct Cervical Spine Wo Contrast  04/13/2015  CLINICAL DATA:  Found unresponsive in snow, initial  encounter EXAM: CT HEAD WITHOUT CONTRAST CT CERVICAL SPINE WITHOUT CONTRAST TECHNIQUE: Multidetector CT imaging of the head and cervical spine was performed following the standard protocol without intravenous contrast. Multiplanar CT image reconstructions of the cervical spine were also generated. COMPARISON:  None. FINDINGS: CT HEAD FINDINGS The bony calvarium is intact. The ventricles are of normal size and configuration. No findings to suggest acute hemorrhage, acute infarction or space-occupying mass lesion are noted. CT CERVICAL SPINE FINDINGS The examination is somewhat degraded by patient motion artifact. Seven cervical segments are well visualized. Vertebral body height is well maintained. The surrounding soft tissues are within normal limits. No acute fracture or acute facet abnormality is seen. IMPRESSION: CT of the head:  No acute abnormality noted. CT of the cervical spine:  No acute abnormality noted. Electronically Signed   By: Alcide Clever M.D.   On: 04/13/2015 19:14   Dg Chest Port 1 View  04/13/2015  CLINICAL DATA:  Altered mental status, found down, slurred speech EXAM: PORTABLE CHEST 1 VIEW COMPARISON:  None. FINDINGS: Heart size normal allowing for technique and very limited inspiratory effect. Mild elevation of the right diaphragm. Mild infiltrate or atelectasis in the right lung base. Lungs  otherwise clear. Bony thorax intact. IMPRESSION: Mild infiltrate versus atelectasis right lower lobe. Electronically Signed   By: Esperanza Heiraymond  Rubner M.D.   On: 04/13/2015 20:11   I have personally reviewed and evaluated these images and lab results as part of my medical decision-making.   EKG Interpretation   Date/Time:  Saturday April 13 2015 18:39:34 EST Ventricular Rate:  90 PR Interval:  49 QRS Duration: 101 QT Interval:  376 QTC Calculation: 460 R Axis:   87 Text Interpretation:  Sinus rhythm Short PR interval Biatrial enlargement  Abnormal R-wave progression, late transition Probable  inferior infarct,  recent Lateral leads are also involved Interpretation limited secondary to  artifact Confirmed by Lincoln Brighamees, Liz [phone]) on 04/13/2015 6:44:33 PM      MDM   Final diagnoses:  Syncope and collapse  Weakness    55 year old [place] gentleman with past topical history of hypertension presents in setting of being found down. Per family and patient with use of interpreter patient was shoveling snow around car this afternoon when he had a syncopal event. Patient reports he remembers shoveling snow and remembers waking up on the ground afterwards. Family reports there was approximately 15-20 minute window where he was not heard from. Patient was found to be beating on car with right arm yelling that he was cold and short of breath. EMS was called and on arrival of EMS patient was unable to move left side. Patient transported to emergency department.  On arrival patient was hemodynamically stable and complaining of being cold. Patient found to be hypertensive and able to move all extremities. His extremities were cyanotic and initial temperature was hypothermic. Patient moving all 4 extremity is without competitions on arrival. He reports he feels like he can't use his arms and legs well. He additionally complained of nausea and shortness of breath. Patient had no urinary incontinence or tongue biting. Family denies any history of seizure activity. Per family that he did have a another syncopal event approximately a month ago. Patient did not seek medical care at that time. Due to being found down of unknown origin extensive workup performed including CT head and C-spine without acute abnormalities. Patient found to have no significant elevation in troponin or no significant EKG findings. Including no signs of acute ischemia. Patient did have acute kidney injury. Patient without signs of anemia or significant electrolyte abnormality. Alcohol levels were normal.  In setting of patient's reported  left-sided weakness Dr. Cyril Mourningamillo with neurology was consult. Please see his note for details. At this time this could be a TIA versus syncopal episode. Neurology recommended medicine admission for further management. I discussed case with Dr. Maryfrances Bunnellanford. Patient admitted to medicine for further management of TIA versus syncope. Patient stable at time of admission. Patient continued to be neurovascularly intact on examination.  Attending has seen and evaluated patient and Dr. Madilyn Hookees is in agreement with plan.    Stacy GardnerAndrew Compton Brigance, MD 04/13/15 2136  Tilden FossaElizabeth Rees, MD 04/14/15 [phone]

## 2015-04-14 ENCOUNTER — Observation Stay (HOSPITAL_BASED_OUTPATIENT_CLINIC_OR_DEPARTMENT_OTHER): Payer: BLUE CROSS/BLUE SHIELD

## 2015-04-14 ENCOUNTER — Observation Stay (HOSPITAL_COMMUNITY): Payer: BLUE CROSS/BLUE SHIELD

## 2015-04-14 DIAGNOSIS — R42 Dizziness and giddiness: Secondary | ICD-10-CM | POA: Diagnosis not present

## 2015-04-14 DIAGNOSIS — R55 Syncope and collapse: Secondary | ICD-10-CM | POA: Diagnosis not present

## 2015-04-14 DIAGNOSIS — G459 Transient cerebral ischemic attack, unspecified: Secondary | ICD-10-CM

## 2015-04-14 DIAGNOSIS — R269 Unspecified abnormalities of gait and mobility: Secondary | ICD-10-CM

## 2015-04-14 DIAGNOSIS — G259 Extrapyramidal and movement disorder, unspecified: Secondary | ICD-10-CM | POA: Diagnosis not present

## 2015-04-14 DIAGNOSIS — K644 Residual hemorrhoidal skin tags: Secondary | ICD-10-CM | POA: Diagnosis present

## 2015-04-14 LAB — LIPID PANEL
Cholesterol: 243 mg/dL — ABNORMAL HIGH (ref 0–200)
HDL: 43 mg/dL (ref >40)
LDL CALC: 178 mg/dL — AB (ref 0–99)
TRIGLYCERIDES: 112 mg/dL (ref <150)
Total CHOL/HDL Ratio: 5.7 RATIO
VLDL: 22 mg/dL (ref 0–40)

## 2015-04-14 LAB — STREP PNEUMONIAE URINARY ANTIGEN: Strep Pneumo Urinary Antigen: NEGATIVE

## 2015-04-14 MED ORDER — OXYMETAZOLINE HCL 0.05 % NA SOLN
2.0000 | Freq: Two times a day (BID) | NASAL | Status: DC
  Administered 2015-04-14 (×2): 2 via NASAL
  Filled 2015-04-14: qty 15

## 2015-04-14 MED ORDER — SODIUM CHLORIDE 0.9 % IV SOLN
INTRAVENOUS | Status: DC
  Administered 2015-04-14 – 2015-04-15 (×2): via INTRAVENOUS

## 2015-04-14 MED ORDER — LORAZEPAM 2 MG/ML IJ SOLN: 0.5000 mg | Freq: Once | INTRAMUSCULAR | Status: DC

## 2015-04-14 MED ORDER — HYDROCORTISONE ACETATE 25 MG RE SUPP
25.0000 mg | Freq: Two times a day (BID) | RECTAL | Status: DC
  Administered 2015-04-14 – 2015-04-15 (×3): 25 mg via RECTAL
  Filled 2015-04-14 (×3): qty 1

## 2015-04-14 MED ORDER — DOCUSATE SODIUM 100 MG PO CAPS
100.0000 mg | ORAL_CAPSULE | Freq: Two times a day (BID) | ORAL | Status: DC
  Administered 2015-04-14 – 2015-04-15 (×3): 100 mg via ORAL
  Filled 2015-04-14 (×3): qty 1

## 2015-04-14 NOTE — Progress Notes (Signed)
[facility] PROGRESS NOTE  Connor Vargas AVW:[id number] DOB: [birth year] DOA: 04/13/2015 PCP: Kaleen MaskELKINS,WILSON OLIVER, MD  Assessment/Plan:  Principal Problem:   Syncope: MRI neg. Carotid dopplers, echo pending. Possibly orthostatic hypotension. Will give IVF, monitor orthostatics, PT eval. EEG pending Active Problems:   CAP (community acquired pneumonia): continue Rocephin, azithro. Add flonase   External hemorrhoids: stool softeners, anusol HC   Code Status:  full Family Communication:   Disposition Plan:  home  Consultants:    Procedures:     Antibiotics:    HPI/Subjective: Some cough, sinus pressure. C/o hemorrhoidal pain. No dizziness  Objective: Filed Vitals:   04/14/15 0843 04/14/15 0844  BP: 122/92 126/92  Pulse: 83 88  Temp:    Resp:      Intake/Output Summary (Last 24 hours) at 04/14/15 0958 Last data filed at 04/14/15 0048  Gross per 24 hour  Intake      0 ml  Output    300 ml  Net   -300 ml   Filed Weights   04/13/15 2137  Weight: 79.2 kg (174 lb 9.7 oz)    Exam:   General:  A and o  HEENT: mild maxillary sinus pressure  Cardiovascular: RRR without MGR  Respiratory: CTA without WRR  Abdomen: S, NT, ND  Rectal: external hemorrhoids without bleeding  Ext: no CCE  Basic Metabolic Panel:  Recent Labs Lab 04/13/15 1853 04/13/15 1901 04/13/15 2215  NA 137 137 136  K 4.1 3.9 4.2  CL 103 102 101  CO2 21*  --  26  GLUCOSE 204* 196* 120*  BUN 19 23* 21*  CREATININE 1.29* 1.20 1.20  CALCIUM 8.9  --  8.8*   Liver Function Tests:  Recent Labs Lab 04/13/15 1853 04/13/15 2215  AST 60* 145*  ALT 52 54  ALKPHOS 44 40  BILITOT 0.6 0.9  PROT 6.6 6.3*  ALBUMIN 4.3 4.1   No results for input(s): LIPASE, AMYLASE in the last 168 hours. No results for input(s): AMMONIA in the last 168 hours. CBC:  Recent Labs Lab 04/13/15 1853 04/13/15 1901 04/13/15 2215  WBC 13.9*  --  15.4*  NEUTROABS 9.3*  --   --   HGB 14.2 15.0  13.1  HCT 42.0 44.0 38.6*  MCV 91.9  --  90.6  PLT 237  --  188   Cardiac Enzymes: No results for input(s): CKTOTAL, CKMB, CKMBINDEX, TROPONINI in the last 168 hours. BNP (last 3 results) No results for input(s): BNP in the last 8760 hours.  ProBNP (last 3 results) No results for input(s): PROBNP in the last 8760 hours.  CBG:  Recent Labs Lab 04/13/15 1840  GLUCAP 144*    No results found for this or any previous visit (from the past 240 hour(s)).   Studies: Ct Head Wo Contrast  04/13/2015  CLINICAL DATA:  Found unresponsive in snow, initial encounter EXAM: CT HEAD WITHOUT CONTRAST CT CERVICAL SPINE WITHOUT CONTRAST TECHNIQUE: Multidetector CT imaging of the head and cervical spine was performed following the standard protocol without intravenous contrast. Multiplanar CT image reconstructions of the cervical spine were also generated. COMPARISON:  None. FINDINGS: CT HEAD FINDINGS The bony calvarium is intact. The ventricles are of normal size and configuration. No findings to suggest acute hemorrhage, acute infarction or space-occupying mass lesion are noted. CT CERVICAL SPINE FINDINGS The examination is somewhat degraded by patient motion artifact. Seven cervical segments are well visualized. Vertebral body height is well maintained. The surrounding soft tissues are within normal limits. No  acute fracture or acute facet abnormality is seen. IMPRESSION: CT of the head:  No acute abnormality noted. CT of the cervical spine:  No acute abnormality noted. Electronically Signed   By: Alcide Clever M.D.   On: 04/13/2015 19:14   Ct Cervical Spine Wo Contrast  04/13/2015  CLINICAL DATA:  Found unresponsive in snow, initial encounter EXAM: CT HEAD WITHOUT CONTRAST CT CERVICAL SPINE WITHOUT CONTRAST TECHNIQUE: Multidetector CT imaging of the head and cervical spine was performed following the standard protocol without intravenous contrast. Multiplanar CT image reconstructions of the cervical spine  were also generated. COMPARISON:  None. FINDINGS: CT HEAD FINDINGS The bony calvarium is intact. The ventricles are of normal size and configuration. No findings to suggest acute hemorrhage, acute infarction or space-occupying mass lesion are noted. CT CERVICAL SPINE FINDINGS The examination is somewhat degraded by patient motion artifact. Seven cervical segments are well visualized. Vertebral body height is well maintained. The surrounding soft tissues are within normal limits. No acute fracture or acute facet abnormality is seen. IMPRESSION: CT of the head:  No acute abnormality noted. CT of the cervical spine:  No acute abnormality noted. Electronically Signed   By: Alcide Clever M.D.   On: 04/13/2015 19:14   Mr Brain Wo Contrast  04/14/2015  CLINICAL DATA:  Found down at outside, syncopal episode. Body weakness. History of syncopal episodes, hypertension and Bell's palsy. EXAM: MRI HEAD WITHOUT CONTRAST TECHNIQUE: Multiplanar, multiecho pulse sequences of the brain and surrounding structures were obtained without intravenous contrast. COMPARISON:  CT head April 13, 2015 FINDINGS: The ventricles and sulci are normal for patient's age. No abnormal parenchymal signal, mass lesions, mass effect. No reduced diffusion to suggest acute ischemia. No susceptibility artifact to suggest hemorrhage. No abnormal extra-axial fluid collections. No extra-axial masses though, contrast enhanced sequences would be more sensitive. Normal major intracranial vascular flow voids seen at the skull base. Ocular globes and orbital contents are nonsuspicious though not tailored for evaluation. Old RIGHT medial orbital blowout fracture. No abnormal sellar expansion. No suspicious calvarial bone marrow signal. Craniocervical junction maintained. Trace paranasal sinus mucosal thickening without air-fluid levels. The mastoid air cells are well aerated. IMPRESSION: Normal noncontrast MRI brain. Electronically Signed   By: Awilda Metro  M.D.   On: 04/14/2015 01:35   Dg Chest Port 1 View  04/13/2015  CLINICAL DATA:  Altered mental status, found down, slurred speech EXAM: PORTABLE CHEST 1 VIEW COMPARISON:  None. FINDINGS: Heart size normal allowing for technique and very limited inspiratory effect. Mild elevation of the right diaphragm. Mild infiltrate or atelectasis in the right lung base. Lungs otherwise clear. Bony thorax intact. IMPRESSION: Mild infiltrate versus atelectasis right lower lobe. Electronically Signed   By: Esperanza Heir M.D.   On: 04/13/2015 20:11    Scheduled Meds: . aspirin  300 mg Rectal Daily   Or  . aspirin  325 mg Oral Daily  . azithromycin  500 mg Oral Q24H  . cefTRIAXone (ROCEPHIN)  IV  1 g Intravenous Q24H  . enoxaparin (LOVENOX) injection  40 mg Subcutaneous Q24H  . LORazepam  0.5 mg Intravenous Once   Continuous Infusions:   Time spent: 35 minutes  Casie Sturgeon L  [facility] [email or web address], password [facility] 04/14/2015, 9:58 AM  LOS: 1 day

## 2015-04-14 NOTE — Progress Notes (Signed)
VASCULAR LAB PRELIMINARY  PRELIMINARY  PRELIMINARY  PRELIMINARY  Carotid duplex  completed.    Preliminary report:  Bilateral:  1-39% ICA stenosis.  Vertebral artery flow is antegrade.      Clanton Emanuelson, RVT 04/14/2015, 12:28 PM

## 2015-04-14 NOTE — Progress Notes (Signed)
Interval history  :                                                                                                                                       Connor Vargas is an 55 y.o. male patient who was admitted for syncope evaluation. He had a fall yesterday and was noted to have some subjective left-sided weakness. MRI of the brain done overnight did not show any acute pathology or an acute stroke. He feels he is at baseline this morning.  no new neurological symptoms. He reports that for the past few months, his been experiencing orthostatic dizziness and lightheaded symptoms, had one syncopal event in the recent past when he was working in [employer] [place]. All of these symptoms occurred when he was either standing or walking, never when supine or sitting. Denies any palpitations.  Does not take any blood pressure medications at home. His son has noticed that his walking has slowed down and also been walking with some mild stooped posture recently. No tremors   Past medical history: Past Medical History  Diagnosis Date  . Bell's palsy     1980    History reviewed. No pertinent past surgical history.  Family History: Family History  Problem Relation Age of Onset  . Coronary artery disease Mother [id number]    Social History:   reports that he has never smoked. He does not have any smokeless tobacco history on file. He reports that he drinks alcohol. He reports that he does not use illicit drugs.  Allergies:  No Known Allergies  Medications:   Current facility-administered medications:  .  acetaminophen (TYLENOL) tablet 650 mg, 650 mg, Oral, Q4H PRN **OR** acetaminophen (TYLENOL) suppository 650 mg, 650 mg, Rectal, Q4H PRN, Alberteen Samhristopher P Danford, MD .  aspirin suppository 300 mg, 300 mg, Rectal, Daily **OR** aspirin tablet 325 mg, 325 mg, Oral, Daily, Alberteen Samhristopher P Danford, MD, 325 mg at 04/14/15 0147 .  azithromycin (ZITHROMAX) tablet 500 mg, 500 mg, Oral, Q24H, Alberteen Samhristopher P Danford, MD,  500 mg at 04/13/15 2330 .  cefTRIAXone (ROCEPHIN) 1 g in dextrose 5 % 50 mL IVPB, 1 g, Intravenous, Q24H, Alberteen Samhristopher P Danford, MD, 1 g at 04/14/15 0147 .  enoxaparin (LOVENOX) injection 40 mg, 40 mg, Subcutaneous, Q24H, Alberteen Samhristopher P Danford, MD, 40 mg at 04/13/15 2330 .  LORazepam (ATIVAN) injection 0.5 mg, 0.5 mg, Intravenous, Once, Rolan Lipahomas Michael Callahan, NP, 0.5 mg at 04/14/15 0045   Neurologic Examination:  Blood pressure 126/92, pulse 88, temperature 98.1 F (36.7 C), temperature source Oral, resp. rate 19, height 6' 1.2" (1.859 m), weight 79.2 kg (174 lb 9.7 oz), SpO2 96 %.  Evaluation of higher integrative functions including: Level of alertness: Alert,  Oriented to time, place and person Recent and remote memory - intact   Attention span and concentration  - intact   Speech: fluent, no evidence of dysarthria or aphasia noted.  Test the following cranial nerves: 2-12 grossly intact Motor examination:  mildly increased tone in the left upper extremity with mild cogwheeling noted in left elbow, normal bulk, full 5/5 motor strength in all 4 extremities Examination of sensation : Normal and symmetric sensation to pinprick in all 4 extremities and on face Examination of deep tendon reflexes: 2+, normal and symmetric in all extremities, normal plantars bilaterally Test coordination: Normal finger nose testing, with no evidence of limb appendicular ataxia or abnormal involuntary movements or tremors noted.  Gait: Mild to moderate gait instability is noted especially with turns, has a wide-based gait and slightly reduced arm swing on the left and minimal stooped posture.    Lab Results: Basic Metabolic Panel:  Recent Labs Lab 04/13/15 1853 04/13/15 1901 04/13/15 2215  NA 137 137 136  K 4.1 3.9 4.2  CL 103 102 101  CO2 21*  --  26  GLUCOSE 204* 196* 120*  BUN 19 23* 21*   CREATININE 1.29* 1.20 1.20  CALCIUM 8.9  --  8.8*    Liver Function Tests:  Recent Labs Lab 04/13/15 1853 04/13/15 2215  AST 60* 145*  ALT 52 54  ALKPHOS 44 40  BILITOT 0.6 0.9  PROT 6.6 6.3*  ALBUMIN 4.3 4.1   No results for input(s): LIPASE, AMYLASE in the last 168 hours. No results for input(s): AMMONIA in the last 168 hours.  CBC:  Recent Labs Lab 04/13/15 1853 04/13/15 1901 04/13/15 2215  WBC 13.9*  --  15.4*  NEUTROABS 9.3*  --   --   HGB 14.2 15.0 13.1  HCT 42.0 44.0 38.6*  MCV 91.9  --  90.6  PLT 237  --  188    Cardiac Enzymes: No results for input(s): CKTOTAL, CKMB, CKMBINDEX, TROPONINI in the last 168 hours.  Lipid Panel:  Recent Labs Lab 04/14/15 0525  CHOL 243*  TRIG 112  HDL 43  CHOLHDL 5.7  VLDL 22  LDLCALC 161*    CBG:  Recent Labs Lab 04/13/15 1840  GLUCAP 144*    Microbiology: No results found for this or any previous visit.   Imaging: Ct Head Wo Contrast  04/13/2015  CLINICAL DATA:  Found unresponsive in snow, initial encounter EXAM: CT HEAD WITHOUT CONTRAST CT CERVICAL SPINE WITHOUT CONTRAST TECHNIQUE: Multidetector CT imaging of the head and cervical spine was performed following the standard protocol without intravenous contrast. Multiplanar CT image reconstructions of the cervical spine were also generated. COMPARISON:  None. FINDINGS: CT HEAD FINDINGS The bony calvarium is intact. The ventricles are of normal size and configuration. No findings to suggest acute hemorrhage, acute infarction or space-occupying mass lesion are noted. CT CERVICAL SPINE FINDINGS The examination is somewhat degraded by patient motion artifact. Seven cervical segments are well visualized. Vertebral body height is well maintained. The surrounding soft tissues are within normal limits. No acute fracture or acute facet abnormality is seen. IMPRESSION: CT of the head:  No acute abnormality noted. CT of the cervical spine:  No acute abnormality noted.  Electronically Signed   By:  Alcide Clever M.D.   On: 04/13/2015 19:14   Ct Cervical Spine Wo Contrast  04/13/2015  CLINICAL DATA:  Found unresponsive in snow, initial encounter EXAM: CT HEAD WITHOUT CONTRAST CT CERVICAL SPINE WITHOUT CONTRAST TECHNIQUE: Multidetector CT imaging of the head and cervical spine was performed following the standard protocol without intravenous contrast. Multiplanar CT image reconstructions of the cervical spine were also generated. COMPARISON:  None. FINDINGS: CT HEAD FINDINGS The bony calvarium is intact. The ventricles are of normal size and configuration. No findings to suggest acute hemorrhage, acute infarction or space-occupying mass lesion are noted. CT CERVICAL SPINE FINDINGS The examination is somewhat degraded by patient motion artifact. Seven cervical segments are well visualized. Vertebral body height is well maintained. The surrounding soft tissues are within normal limits. No acute fracture or acute facet abnormality is seen. IMPRESSION: CT of the head:  No acute abnormality noted. CT of the cervical spine:  No acute abnormality noted. Electronically Signed   By: Alcide Clever M.D.   On: 04/13/2015 19:14   Mr Brain Wo Contrast  04/14/2015  CLINICAL DATA:  Found down at outside, syncopal episode. Body weakness. History of syncopal episodes, hypertension and Bell's palsy. EXAM: MRI HEAD WITHOUT CONTRAST TECHNIQUE: Multiplanar, multiecho pulse sequences of the brain and surrounding structures were obtained without intravenous contrast. COMPARISON:  CT head April 13, 2015 FINDINGS: The ventricles and sulci are normal for patient's age. No abnormal parenchymal signal, mass lesions, mass effect. No reduced diffusion to suggest acute ischemia. No susceptibility artifact to suggest hemorrhage. No abnormal extra-axial fluid collections. No extra-axial masses though, contrast enhanced sequences would be more sensitive. Normal major intracranial vascular flow voids seen at the  skull base. Ocular globes and orbital contents are nonsuspicious though not tailored for evaluation. Old RIGHT medial orbital blowout fracture. No abnormal sellar expansion. No suspicious calvarial bone marrow signal. Craniocervical junction maintained. Trace paranasal sinus mucosal thickening without air-fluid levels. The mastoid air cells are well aerated. IMPRESSION: Normal noncontrast MRI brain. Electronically Signed   By: Awilda Metro M.D.   On: 04/14/2015 01:35   Dg Chest Port 1 View  04/13/2015  CLINICAL DATA:  Altered mental status, found down, slurred speech EXAM: PORTABLE CHEST 1 VIEW COMPARISON:  None. FINDINGS: Heart size normal allowing for technique and very limited inspiratory effect. Mild elevation of the right diaphragm. Mild infiltrate or atelectasis in the right lung base. Lungs otherwise clear. Bony thorax intact. IMPRESSION: Mild infiltrate versus atelectasis right lower lobe. Electronically Signed   By: Esperanza Heir M.D.   On: 04/13/2015 20:11     Assessment and plan:   Courage Balbi is an 55 y.o. male patient who was admitted following a fall with transient subjective left-sided weakness symptoms as described in the consultation note. MRI of the brain did not show evidence of acute stroke. He has normal full strength in all 4 extremities during my exam this morning. He does have mildly increased tone in his left upper extremity, suspect that he is having mild extrapyramidal motor symptoms on the left side contributing to his gait instability. He also reported having frequent symptoms of orthostatic dizziness which I suspect could've contributed to his fall in addition to the gait instability. Orthostatic blood pressures was checked this morning which showed a 14 point drop in systolic blood pressures from supine to sitting. It is not uncommon to have a mild autonomic dysfunction in patients with extra pyramidal symptoms, causing orthostatic blood pressure changes.  Recommend  completing a  cardiac syncope workup with echocardiogram, and to rule out any arrhythmias. He is on cardiac telemetry monitoring at this time. He would benefit from physical therapy for gait and balance training, counseled about falls prevention and advised him to use a cane especially on uneven ground. He is also advised to follow up with outpatient neurology in 2-3 months to continue monitoring of his extrapyramidal symptoms and gait instability.  We'll sign off. Please call for any further questions.

## 2015-04-14 NOTE — Progress Notes (Signed)
  Echocardiogram 2D Echocardiogram has been performed.  Arvil ChacoFoster, Lorissa Kishbaugh 04/14/2015, 2:44 PM

## 2015-04-15 ENCOUNTER — Observation Stay (HOSPITAL_COMMUNITY): Payer: BLUE CROSS/BLUE SHIELD

## 2015-04-15 DIAGNOSIS — R531 Weakness: Secondary | ICD-10-CM | POA: Diagnosis not present

## 2015-04-15 DIAGNOSIS — J189 Pneumonia, unspecified organism: Secondary | ICD-10-CM | POA: Diagnosis not present

## 2015-04-15 DIAGNOSIS — R55 Syncope and collapse: Secondary | ICD-10-CM | POA: Diagnosis not present

## 2015-04-15 LAB — HEMOGLOBIN A1C
Hgb A1c MFr Bld: 5.4 % (ref 4.8–5.6)
MEAN PLASMA GLUCOSE: 108 mg/dL

## 2015-04-15 MED ORDER — DOCUSATE SODIUM 100 MG PO CAPS: 100.0000 mg | ORAL_CAPSULE | Freq: Two times a day (BID) | ORAL | Status: AC

## 2015-04-15 MED ORDER — HYDROCORTISONE ACETATE 25 MG RE SUPP: 25.0000 mg | Freq: Two times a day (BID) | RECTAL | Status: AC

## 2015-04-15 MED ORDER — LEVOFLOXACIN 750 MG PO TABS: 750.0000 mg | ORAL_TABLET | Freq: Every day | ORAL | Status: DC

## 2015-04-15 NOTE — Care Management Note (Signed)
Case Management Note  Patient Details  Name: Connor Vargas MRN: [id number] Date of Birth: Sep 20, 1960  Subjective/Objective:                  Date- 04-15-15 Initial Assessment Spoke with patient's son at the bedside, patient in procedure.  Introduced self as Sports coachcase manager and explained role in discharge planning and how to be reached.  Verified patient lives at home with family.  Verified patient anticipates to go home with  family,  at time of discharge Patient has no DME. Expressed potential need for no other DME.  Patient denied needing help with their medication.  Patient drives to MD appointments.  Verified patient has PCP. Patient states they currently receive HH services through no one.    Plan: CM will continue to follow for discharge planning and [facility].   Lawerance Sabalebbie Jacobey Gura RN BSN CM [phone]   Action/Plan:  No CM needs identified at this time  Expected Discharge Date:                  Expected Discharge Plan:  Home/Self Care  In-House Referral:     Discharge planning Services  CM Consult  Post Acute Care Choice:    Choice offered to:     DME Arranged:    DME Agency:     HH Arranged:    HH Agency:     Status of Service:  Completed, signed off  Medicare Important Message Given:    Date Medicare IM Given:    Medicare IM give by:    Date Additional Medicare IM Given:    Additional Medicare Important Message give by:     If discussed at Long Length of Stay Meetings, dates discussed:    Additional Comments:  Lawerance SabalDebbie Kareen Jefferys, RN 04/15/2015, 12:08 PM

## 2015-04-15 NOTE — Evaluation (Signed)
Physical Therapy Evaluation Patient Details Name: Connor Vargas MRN: [id number] DOB: 1961-03-01 Today's Date: 04/15/2015   History of Present Illness  Pt is a 55 y/o male admitted for syncope.  Clinical Impression  Patient evaluated by Physical Therapy with no further acute PT needs identified. All education has been completed and the patient has no further questions.  See below for any follow-up Physical Therapy or equipment needs. PT is signing off. Thank you for this referral.     Follow Up Recommendations No PT follow up    Equipment Recommendations  None recommended by PT    Recommendations for Other Services       Precautions / Restrictions Precautions Precautions: None Restrictions Weight Bearing Restrictions: No      Mobility  Bed Mobility Overal bed mobility: Independent                Transfers Overall transfer level: Independent Equipment used: None                Ambulation/Gait Ambulation/Gait assistance: Independent Ambulation Distance (Feet): 300 Feet Assistive device: None Gait Pattern/deviations: WFL(Within Functional Limits)   Gait velocity interpretation: at or above normal speed for age/gender    Stairs            Wheelchair Mobility    Modified Rankin (Stroke Patients Only)       Balance Overall balance assessment: Independent                                           Pertinent Vitals/Pain Pain Assessment: No/denies pain    Home Living Family/patient expects to be discharged to:: Private residence Living Arrangements: Spouse/significant other;Children Available Help at Discharge: Family;Available PRN/intermittently Type of Home: House Home Access: Level entry     Home Layout: Two level;Able to live on main level with bedroom/bathroom        Prior Function Level of Independence: Independent               Hand Dominance        Extremity/Trunk Assessment                Lower Extremity Assessment: Overall WFL for tasks assessed         Communication   Communication: Prefers language other than [place];No difficulties  Cognition Arousal/Alertness: Awake/alert Behavior During Therapy: WFL for tasks assessed/performed Overall Cognitive Status: Within Functional Limits for tasks assessed                      General Comments      Exercises        Assessment/Plan    PT Assessment Patent does not need any further PT services  PT Diagnosis     PT Problem List    PT Treatment Interventions     PT Goals (Current goals can be found in the Care Plan section) Acute Rehab PT Goals Patient Stated Goal: to go home PT Goal Formulation: All assessment and education complete, DC therapy    Frequency     Barriers to discharge        Co-evaluation               End of Session Equipment Utilized During Treatment: Gait belt Activity Tolerance: Patient tolerated treatment well Patient left: in bed;with call bell/phone within reach;with family/visitor present Nurse Communication: Mobility status  Functional Assessment Tool Used: clinical judgement; independent Functional Limitation: Mobility: Walking and moving around Mobility: Walking and Moving Around Current Status [phone]): 0 percent impaired, limited or restricted Mobility: Walking and Moving Around Goal Status [phone]): 0 percent impaired, limited or restricted Mobility: Walking and Moving Around Discharge Status [phone]): 0 percent impaired, limited or restricted    Time: 0822-0835 PT Time Calculation (min) (ACUTE ONLY): 13 min   Charges:   PT Evaluation $PT Eval Low Complexity: 1 Procedure     PT G Codes:   PT G-Codes **NOT FOR INPATIENT CLASS** Functional Assessment Tool Used: clinical judgement; independent Functional Limitation: Mobility: Walking and moving around Mobility: Walking and Moving Around Current Status (B1478): 0 percent impaired, limited or  restricted Mobility: Walking and Moving Around Goal Status (G9562): 0 percent impaired, limited or restricted Mobility: Walking and Moving Around Discharge Status [phone]): 0 percent impaired, limited or restricted   Clarita Crane, PT, DPT 04/15/2015 10:16 AM [phone]

## 2015-04-15 NOTE — Procedures (Signed)
ELECTROENCEPHALOGRAM REPORT  Patient: Connor BarrenRadovan Vargas       Room #: 0J815W02 EEG No. ID: [id number] Age: 55 y.o.        Sex: male Referring Physician: Paris LoreSULLIVAN, C Report Date:  04/15/2015        Interpreting Physician: Aline BrochureSTEWART,Naureen Benton R  History: Connor Vargas is an 55 y.o. male admitted following an episode of loss of consciousness, most likely syncopal in nature.  Indications for study:  Rule out seizure disorder.  Technique: This is an 18 channel routine scalp EEG performed at the bedside with bipolar and monopolar montages arranged in accordance to the international 10/20 system of electrode placement.   Description: This EEG recording was performed during wakefulness. Predominant activity consisted of 9 Hz alpha rhythm with good attenuation with eye opening. Photic stimulation produced symmetrical occipital driving response.  Hyperventilation produced a normal symmetrical slowing response. No epileptiform discharges were recorded. There was no abnormal slowing of cerebral activity.  Interpretation: This is a normal EEG recording during wakefulness. No evidence of an epileptic disorder was demonstrated. However, a normal EEG recording in and of itself does not rule out seizure disorder.    Venetia MaxonR Derold Dorsch M.D. [facility] [phone]

## 2015-04-15 NOTE — Progress Notes (Signed)
EEG completed; results pending.    

## 2015-04-15 NOTE — Discharge Summary (Signed)
Physician Discharge Summary  Connor Vargas WGN:[id number] DOB: July 06, 1960 DOA: 04/13/2015  PCP: Kaleen MaskELKINS,WILSON OLIVER, MD  Admit date: 04/13/2015 Discharge date: 04/15/2015   Recommendations for Outpatient Follow-up:  1. Monitor orthostatics 2. Monitor creatinine 3. Monitor lipids   Discharge Diagnoses:  Principal Problem:   syncope Active Problems:   CAP (community acquired pneumonia)   External hemorrhoids Hyperglycemia Hyperlipidemia Renal insufficiency LVH  Discharge Condition: stable  Diet recommendation: low cholesterol high fiber  Filed Weights   04/13/15 2137  Weight: 79.2 kg (174 lb 9.7 oz)    History of present illness:  55 y.o. male with no past medical history significant who presents with fall.  Translator is deferred and all history is collected from the patient or through a family friend, who is a hospitalist. The patient is healthy at baseline but notes that he has spells occasionally, over >1 year of exertional weakness, starting at the extremities and progressing to global weakness. These are not associated with chest pressure/discomfort, dyspnea, or palpitations, but are relieved with rest. They appear to be accelerating in frequency. 1 month ago, he had an episode and lost consciousness completely (there was prodrome of feeling weak, then he blacked out, and came back shortly after).  Today, he had had a heavy breakfast and felt some abdominal discomfort/bloating/gas. He went out to the car to clear off snow, and started to get weak, then the next thing he remembers is waking up calling for his wife. Wife reports ~15 minutes before he was seen, and called EMS. When EMS arrived, he was still down, had L sided leg and arm weakness, and slurred speech and blood in nares. Was hypothermic and transported to ED>  In the ED, the patient was hypertensive and hypothermic. His strength was symmetric on arrival. He had leukocytosis, and chest x-ray with questionable R  sided infiltrate, and had brief desaturation into 80s. A CT head was negative, and patient was seen by Neurology, who recommended stroke work up for transient reported L sided weakness.  He denies cough, fever before today, sputum, chills, dyspnea. Patient takes no medicines at baseline, but has medical follow up. No previous allergies. Mother with premature CAD.   Hospital Course:  Admitted to telemetry. Neurology consulted. Started on treatment for CAP.  MRI negative. Echo unremarkable. Carotid dopplers without significant stenosis.  EEG normal.  Was noted to have orthostatic drop from supine to sitting.  Neurology noted slight upper extremity spasticity, felt recurrent syncopal episodes may be orthostatic hypotension. Patient was hydrated, encouraged adequate hydratikon. Worked with PT.  Blood glucose high on admission, but hgb a1c 5.2, consistent with stress response.  LDL 170. Patient reports h/o hyperlipidemia and declines statin. Patient complains of hemorrhoid discomfort. Steroid suppositories started, high fiber, stool softeners recommended.  Procedures:  none  Consultations:  neurology  Discharge Exam: Filed Vitals:   04/14/15 2108 04/15/15 0508  BP: 128/79   Pulse: 74   Temp: 98 F (36.7 C) 98.1 F (36.7 C)  Resp: 16 16    General: a and o Cardiovascular: RRR Respiratory: CTA  Discharge Instructions   Discharge Instructions    Diet general    Complete by:  As directed      Increase activity slowly    Complete by:  As directed           Current Discharge Medication List    START taking these medications   Details  docusate sodium (COLACE) 100 MG capsule Take 1 capsule (100 mg total) by mouth  2 (two) times daily. Qty: 10 capsule, Refills: 0    hydrocortisone (ANUSOL-HC) 25 MG suppository Place 1 suppository (25 mg total) rectally 2 (two) times daily. Qty: 12 suppository, Refills: 0    levofloxacin (LEVAQUIN) 750 MG tablet Take 1 tablet (750 mg total)  by mouth daily. Qty: 4 tablet, Refills: 0       No Known Allergies Follow-up Information    Follow up with Kaleen Mask, MD In 1 month.   Specialty:  Family Medicine   Contact information:   [address] [place] [address] [phone]        The results of significant diagnostics from this hospitalization (including imaging, microbiology, ancillary and laboratory) are listed below for reference.    Significant Diagnostic Studies: Ct Head Wo Contrast  04/13/2015  CLINICAL DATA:  Found unresponsive in snow, initial encounter EXAM: CT HEAD WITHOUT CONTRAST CT CERVICAL SPINE WITHOUT CONTRAST TECHNIQUE: Multidetector CT imaging of the head and cervical spine was performed following the standard protocol without intravenous contrast. Multiplanar CT image reconstructions of the cervical spine were also generated. COMPARISON:  None. FINDINGS: CT HEAD FINDINGS The bony calvarium is intact. The ventricles are of normal size and configuration. No findings to suggest acute hemorrhage, acute infarction or space-occupying mass lesion are noted. CT CERVICAL SPINE FINDINGS The examination is somewhat degraded by patient motion artifact. Seven cervical segments are well visualized. Vertebral body height is well maintained. The surrounding soft tissues are within normal limits. No acute fracture or acute facet abnormality is seen. IMPRESSION: CT of the head:  No acute abnormality noted. CT of the cervical spine:  No acute abnormality noted. Electronically Signed   By: Alcide Clever M.D.   On: 04/13/2015 19:14   Ct Cervical Spine Wo Contrast  04/13/2015  CLINICAL DATA:  Found unresponsive in snow, initial encounter EXAM: CT HEAD WITHOUT CONTRAST CT CERVICAL SPINE WITHOUT CONTRAST TECHNIQUE: Multidetector CT imaging of the head and cervical spine was performed following the standard protocol without intravenous contrast. Multiplanar CT image reconstructions of the cervical spine were also  generated. COMPARISON:  None. FINDINGS: CT HEAD FINDINGS The bony calvarium is intact. The ventricles are of normal size and configuration. No findings to suggest acute hemorrhage, acute infarction or space-occupying mass lesion are noted. CT CERVICAL SPINE FINDINGS The examination is somewhat degraded by patient motion artifact. Seven cervical segments are well visualized. Vertebral body height is well maintained. The surrounding soft tissues are within normal limits. No acute fracture or acute facet abnormality is seen. IMPRESSION: CT of the head:  No acute abnormality noted. CT of the cervical spine:  No acute abnormality noted. Electronically Signed   By: Alcide Clever M.D.   On: 04/13/2015 19:14   Mr Brain Wo Contrast  04/14/2015  CLINICAL DATA:  Found down at outside, syncopal episode. Body weakness. History of syncopal episodes, hypertension and Bell's palsy. EXAM: MRI HEAD WITHOUT CONTRAST TECHNIQUE: Multiplanar, multiecho pulse sequences of the brain and surrounding structures were obtained without intravenous contrast. COMPARISON:  CT head April 13, 2015 FINDINGS: The ventricles and sulci are normal for patient's age. No abnormal parenchymal signal, mass lesions, mass effect. No reduced diffusion to suggest acute ischemia. No susceptibility artifact to suggest hemorrhage. No abnormal extra-axial fluid collections. No extra-axial masses though, contrast enhanced sequences would be more sensitive. Normal major intracranial vascular flow voids seen at the skull base. Ocular globes and orbital contents are nonsuspicious though not tailored for evaluation. Old RIGHT medial orbital blowout fracture. No  abnormal sellar expansion. No suspicious calvarial bone marrow signal. Craniocervical junction maintained. Trace paranasal sinus mucosal thickening without air-fluid levels. The mastoid air cells are well aerated. IMPRESSION: Normal noncontrast MRI brain. Electronically Signed   By: Awilda Metro M.D.   On:  04/14/2015 01:35   Dg Chest Port 1 View  04/13/2015  CLINICAL DATA:  Altered mental status, found down, slurred speech EXAM: PORTABLE CHEST 1 VIEW COMPARISON:  None. FINDINGS: Heart size normal allowing for technique and very limited inspiratory effect. Mild elevation of the right diaphragm. Mild infiltrate or atelectasis in the right lung base. Lungs otherwise clear. Bony thorax intact. IMPRESSION: Mild infiltrate versus atelectasis right lower lobe. Electronically Signed   By: Esperanza Heir M.D.   On: 04/13/2015 20:11   Echo - Left ventricle: The cavity size was normal. Wall thickness was increased in a pattern of severe LVH. Systolic function was normal. The estimated ejection fraction was in the range of 50% to 55%. Wall motion was normal; there were no regional wall motion abnormalities. Doppler parameters are consistent with abnormal left ventricular relaxation (grade 1 diastolic dysfunction). - Left atrium: The atrium was mildly dilated.  Impressions:  - Low normal LV systolic function; severe LVH; grade 1 diastolic dysfunction; mild LAE; mildly calcified aortic valve; thickened MV with trace MR; small pericardial effusion.  Carotid doppler The vertebral arteries appear patent with antegrade flow. - Findings consistent with 1-39 percent stenosis involving the  right internal carotid artery and the left internal carotid  artery.  Microbiology: No results found for this or any previous visit (from the past 240 hour(s)).   Labs: Basic Metabolic Panel:  Recent Labs Lab 04/13/15 1853 04/13/15 1901 04/13/15 2215  NA 137 137 136  K 4.1 3.9 4.2  CL 103 102 101  CO2 21*  --  26  GLUCOSE 204* 196* 120*  BUN 19 23* 21*  CREATININE 1.29* 1.20 1.20  CALCIUM 8.9  --  8.8*   Liver Function Tests:  Recent Labs Lab 04/13/15 1853 04/13/15 2215  AST 60* 145*  ALT 52 54  ALKPHOS 44 40  BILITOT 0.6 0.9  PROT 6.6 6.3*  ALBUMIN 4.3 4.1   No results for  input(s): LIPASE, AMYLASE in the last 168 hours. No results for input(s): AMMONIA in the last 168 hours. CBC:  Recent Labs Lab 04/13/15 1853 04/13/15 1901 04/13/15 2215  WBC 13.9*  --  15.4*  NEUTROABS 9.3*  --   --   HGB 14.2 15.0 13.1  HCT 42.0 44.0 38.6*  MCV 91.9  --  90.6  PLT 237  --  188   Cardiac Enzymes: No results for input(s): CKTOTAL, CKMB, CKMBINDEX, TROPONINI in the last 168 hours. BNP: BNP (last 3 results) No results for input(s): BNP in the last 8760 hours.  ProBNP (last 3 results) No results for input(s): PROBNP in the last 8760 hours.  CBG:  Recent Labs Lab 04/13/15 1840  GLUCAP 144*       Signed:  Christiane Ha MD.  [facility] 04/15/2015, 12:20 PM

## 2015-04-15 NOTE — Progress Notes (Signed)
To whom it may concern:   Connor Vargas is unable to work 04/13/15 through 04/22/15 due to illness.    Sincerely,     Crista Curborinna Johaan Ryser, MD [facility]

## 2016-05-31 ENCOUNTER — Inpatient Hospital Stay (HOSPITAL_COMMUNITY): Payer: BLUE CROSS/BLUE SHIELD

## 2016-05-31 ENCOUNTER — Inpatient Hospital Stay (HOSPITAL_COMMUNITY)
Admission: EM | Admit: 2016-05-31 | Discharge: 2016-06-10 | DRG: 291 | Disposition: A | Discharge status: Rehabilitation Facility | Payer: BLUE CROSS/BLUE SHIELD | Attending: Internal Medicine | Admitting: Internal Medicine

## 2016-05-31 ENCOUNTER — Emergency Department (HOSPITAL_COMMUNITY): Payer: BLUE CROSS/BLUE SHIELD

## 2016-05-31 ENCOUNTER — Encounter (HOSPITAL_COMMUNITY): Payer: Self-pay | Admitting: Emergency Medicine

## 2016-05-31 DIAGNOSIS — K645 Perianal venous thrombosis: Secondary | ICD-10-CM | POA: Diagnosis present

## 2016-05-31 DIAGNOSIS — D72829 Elevated white blood cell count, unspecified: Secondary | ICD-10-CM | POA: Diagnosis not present

## 2016-05-31 DIAGNOSIS — E7800 Pure hypercholesterolemia, unspecified: Secondary | ICD-10-CM | POA: Diagnosis present

## 2016-05-31 DIAGNOSIS — G737 Myopathy in diseases classified elsewhere: Secondary | ICD-10-CM | POA: Diagnosis present

## 2016-05-31 DIAGNOSIS — R778 Other specified abnormalities of plasma proteins: Secondary | ICD-10-CM

## 2016-05-31 DIAGNOSIS — E876 Hypokalemia: Secondary | ICD-10-CM | POA: Diagnosis not present

## 2016-05-31 DIAGNOSIS — E8809 Other disorders of plasma-protein metabolism, not elsewhere classified: Secondary | ICD-10-CM

## 2016-05-31 DIAGNOSIS — I2510 Atherosclerotic heart disease of native coronary artery without angina pectoris: Secondary | ICD-10-CM | POA: Diagnosis present

## 2016-05-31 DIAGNOSIS — I429 Cardiomyopathy, unspecified: Secondary | ICD-10-CM

## 2016-05-31 DIAGNOSIS — M6282 Rhabdomyolysis: Secondary | ICD-10-CM | POA: Diagnosis present

## 2016-05-31 DIAGNOSIS — I5042 Chronic combined systolic (congestive) and diastolic (congestive) heart failure: Secondary | ICD-10-CM | POA: Diagnosis not present

## 2016-05-31 DIAGNOSIS — J189 Pneumonia, unspecified organism: Secondary | ICD-10-CM | POA: Diagnosis present

## 2016-05-31 DIAGNOSIS — I252 Old myocardial infarction: Secondary | ICD-10-CM

## 2016-05-31 DIAGNOSIS — R52 Pain, unspecified: Secondary | ICD-10-CM

## 2016-05-31 DIAGNOSIS — R740 Nonspecific elevation of levels of transaminase and lactic acid dehydrogenase [LDH]: Secondary | ICD-10-CM | POA: Diagnosis not present

## 2016-05-31 DIAGNOSIS — R748 Abnormal levels of other serum enzymes: Secondary | ICD-10-CM | POA: Diagnosis not present

## 2016-05-31 DIAGNOSIS — I5023 Acute on chronic systolic (congestive) heart failure: Principal | ICD-10-CM | POA: Diagnosis present

## 2016-05-31 DIAGNOSIS — R0989 Other specified symptoms and signs involving the circulatory and respiratory systems: Secondary | ICD-10-CM | POA: Diagnosis present

## 2016-05-31 DIAGNOSIS — R7989 Other specified abnormal findings of blood chemistry: Secondary | ICD-10-CM

## 2016-05-31 DIAGNOSIS — Z8249 Family history of ischemic heart disease and other diseases of the circulatory system: Secondary | ICD-10-CM

## 2016-05-31 DIAGNOSIS — I5041 Acute combined systolic (congestive) and diastolic (congestive) heart failure: Secondary | ICD-10-CM | POA: Diagnosis not present

## 2016-05-31 DIAGNOSIS — K649 Unspecified hemorrhoids: Secondary | ICD-10-CM | POA: Diagnosis not present

## 2016-05-31 DIAGNOSIS — E871 Hypo-osmolality and hyponatremia: Secondary | ICD-10-CM

## 2016-05-31 DIAGNOSIS — E039 Hypothyroidism, unspecified: Secondary | ICD-10-CM | POA: Diagnosis present

## 2016-05-31 DIAGNOSIS — Z09 Encounter for follow-up examination after completed treatment for conditions other than malignant neoplasm: Secondary | ICD-10-CM

## 2016-05-31 DIAGNOSIS — K5900 Constipation, unspecified: Secondary | ICD-10-CM | POA: Diagnosis present

## 2016-05-31 DIAGNOSIS — Z79899 Other long term (current) drug therapy: Secondary | ICD-10-CM | POA: Diagnosis not present

## 2016-05-31 DIAGNOSIS — K644 Residual hemorrhoidal skin tags: Secondary | ICD-10-CM | POA: Diagnosis present

## 2016-05-31 DIAGNOSIS — R0600 Dyspnea, unspecified: Secondary | ICD-10-CM | POA: Diagnosis not present

## 2016-05-31 DIAGNOSIS — I959 Hypotension, unspecified: Secondary | ICD-10-CM | POA: Diagnosis present

## 2016-05-31 DIAGNOSIS — R5381 Other malaise: Secondary | ICD-10-CM | POA: Diagnosis not present

## 2016-05-31 DIAGNOSIS — D638 Anemia in other chronic diseases classified elsewhere: Secondary | ICD-10-CM | POA: Diagnosis present

## 2016-05-31 DIAGNOSIS — Z9889 Other specified postprocedural states: Secondary | ICD-10-CM

## 2016-05-31 DIAGNOSIS — M3320 Polymyositis, organ involvement unspecified: Secondary | ICD-10-CM

## 2016-05-31 DIAGNOSIS — J810 Acute pulmonary edema: Secondary | ICD-10-CM

## 2016-05-31 DIAGNOSIS — J90 Pleural effusion, not elsewhere classified: Secondary | ICD-10-CM | POA: Diagnosis present

## 2016-05-31 DIAGNOSIS — R531 Weakness: Secondary | ICD-10-CM

## 2016-05-31 DIAGNOSIS — I5021 Acute systolic (congestive) heart failure: Secondary | ICD-10-CM | POA: Diagnosis not present

## 2016-05-31 DIAGNOSIS — K5901 Slow transit constipation: Secondary | ICD-10-CM | POA: Diagnosis not present

## 2016-05-31 DIAGNOSIS — D72828 Other elevated white blood cell count: Secondary | ICD-10-CM | POA: Diagnosis not present

## 2016-05-31 DIAGNOSIS — I420 Dilated cardiomyopathy: Secondary | ICD-10-CM | POA: Diagnosis not present

## 2016-05-31 DIAGNOSIS — D62 Acute posthemorrhagic anemia: Secondary | ICD-10-CM | POA: Diagnosis not present

## 2016-05-31 DIAGNOSIS — I2584 Coronary atherosclerosis due to calcified coronary lesion: Secondary | ICD-10-CM | POA: Diagnosis not present

## 2016-05-31 DIAGNOSIS — R7401 Elevation of levels of liver transaminase levels: Secondary | ICD-10-CM | POA: Diagnosis present

## 2016-05-31 DIAGNOSIS — E46 Unspecified protein-calorie malnutrition: Secondary | ICD-10-CM | POA: Diagnosis not present

## 2016-05-31 HISTORY — DX: Disorder of thyroid, unspecified: E07.9

## 2016-05-31 LAB — COMPREHENSIVE METABOLIC PANEL
ALT: 133 U/L — ABNORMAL HIGH (ref 17–63)
AST: 222 U/L — ABNORMAL HIGH (ref 15–41)
Albumin: 2.9 g/dL — ABNORMAL LOW (ref 3.5–5.0)
Alkaline Phosphatase: 60 U/L (ref 38–126)
Anion gap: 9 (ref 5–15)
BUN: 13 mg/dL (ref 6–20)
CHLORIDE: 95 mmol/L — AB (ref 101–111)
CO2: 21 mmol/L — AB (ref 22–32)
Calcium: 8.5 mg/dL — ABNORMAL LOW (ref 8.9–10.3)
Creatinine, Ser: 0.64 mg/dL (ref 0.61–1.24)
Glucose, Bld: 94 mg/dL (ref 65–99)
Potassium: 4.7 mmol/L (ref 3.5–5.1)
SODIUM: 125 mmol/L — AB (ref 135–145)
Total Bilirubin: 0.9 mg/dL (ref 0.3–1.2)
Total Protein: 5.9 g/dL — ABNORMAL LOW (ref 6.5–8.1)

## 2016-05-31 LAB — URINALYSIS, ROUTINE W REFLEX MICROSCOPIC
Bacteria, UA: NONE SEEN
Bilirubin Urine: NEGATIVE
GLUCOSE, UA: NEGATIVE mg/dL
KETONES UR: NEGATIVE mg/dL
LEUKOCYTES UA: NEGATIVE
Nitrite: NEGATIVE
Protein, ur: NEGATIVE mg/dL
Specific Gravity, Urine: 1.013 (ref 1.005–1.030)
pH: 5 (ref 5.0–8.0)

## 2016-05-31 LAB — CBC
HCT: 38.2 % — ABNORMAL LOW (ref 39.0–52.0)
Hemoglobin: 12.6 g/dL — ABNORMAL LOW (ref 13.0–17.0)
MCH: 29 pg (ref 26.0–34.0)
MCHC: 33 g/dL (ref 30.0–36.0)
MCV: 88 fL (ref 78.0–100.0)
Platelets: 339 10*3/uL (ref 150–400)
RBC: 4.34 MIL/uL (ref 4.22–5.81)
RDW: 14.5 % (ref 11.5–15.5)
WBC: 10.5 10*3/uL (ref 4.0–10.5)

## 2016-05-31 LAB — TSH: TSH: 15.602 u[IU]/mL — ABNORMAL HIGH (ref 0.350–4.500)

## 2016-05-31 LAB — TROPONIN I
TROPONIN I: 0.59 ng/mL — AB (ref <0.03)
Troponin I: 0.53 ng/mL (ref <0.03)
Troponin I: 0.8 ng/mL (ref <0.03)

## 2016-05-31 LAB — CK: Total CK: 5482 U/L — ABNORMAL HIGH (ref 49–397)

## 2016-05-31 LAB — BRAIN NATRIURETIC PEPTIDE: B NATRIURETIC PEPTIDE 5: 1328.2 pg/mL — AB (ref 0.0–100.0)

## 2016-05-31 MED ORDER — SODIUM CHLORIDE 0.9 % IV SOLN: INTRAVENOUS | Status: DC

## 2016-05-31 MED ORDER — FUROSEMIDE 10 MG/ML IJ SOLN: 40.0000 mg | Freq: Every day | INTRAMUSCULAR | Status: DC

## 2016-05-31 MED ORDER — OXYCODONE HCL 5 MG PO TABS
5.0000 mg | ORAL_TABLET | ORAL | Status: DC | PRN
  Administered 2016-06-01 – 2016-06-10 (×9): 5 mg via ORAL
  Filled 2016-05-31 (×9): qty 1

## 2016-05-31 MED ORDER — LEVOTHYROXINE SODIUM 75 MCG PO TABS
175.0000 ug | ORAL_TABLET | Freq: Every day | ORAL | Status: DC
  Administered 2016-06-01 – 2016-06-10 (×10): 175 ug via ORAL
  Filled 2016-05-31 (×2): qty 1
  Filled 2016-05-31: qty 2
  Filled 2016-05-31 (×8): qty 1

## 2016-05-31 MED ORDER — ONDANSETRON HCL 4 MG/2ML IJ SOLN
4.0000 mg | Freq: Four times a day (QID) | INTRAMUSCULAR | Status: DC | PRN
  Administered 2016-06-01: 4 mg via INTRAVENOUS
  Filled 2016-05-31: qty 2

## 2016-05-31 MED ORDER — FUROSEMIDE 10 MG/ML IJ SOLN
40.0000 mg | Freq: Once | INTRAMUSCULAR | Status: AC
  Administered 2016-05-31: 40 mg via INTRAVENOUS
  Filled 2016-05-31: qty 4

## 2016-05-31 MED ORDER — HYDROCORTISONE ACE-PRAMOXINE 1-1 % RE FOAM
1.0000 | Freq: Once | RECTAL | Status: DC
  Filled 2016-05-31: qty 10

## 2016-05-31 MED ORDER — ONDANSETRON HCL 4 MG PO TABS: 4.0000 mg | ORAL_TABLET | Freq: Four times a day (QID) | ORAL | Status: DC | PRN

## 2016-05-31 MED ORDER — TRAZODONE HCL 50 MG PO TABS: 50.0000 mg | ORAL_TABLET | Freq: Every evening | ORAL | Status: DC | PRN

## 2016-05-31 MED ORDER — ENOXAPARIN SODIUM 40 MG/0.4ML ~~LOC~~ SOLN
40.0000 mg | SUBCUTANEOUS | Status: DC
  Administered 2016-05-31 – 2016-06-10 (×11): 40 mg via SUBCUTANEOUS
  Filled 2016-05-31 (×11): qty 0.4

## 2016-05-31 MED ORDER — ASPIRIN EC 325 MG PO TBEC
325.0000 mg | DELAYED_RELEASE_TABLET | Freq: Once | ORAL | Status: AC
  Administered 2016-05-31: 325 mg via ORAL
  Filled 2016-05-31: qty 1

## 2016-05-31 MED ORDER — HYDROCORTISONE ACETATE 25 MG RE SUPP
25.0000 mg | Freq: Two times a day (BID) | RECTAL | Status: DC
  Administered 2016-05-31 – 2016-06-07 (×5): 25 mg via RECTAL
  Filled 2016-05-31 (×22): qty 1

## 2016-05-31 MED ORDER — SODIUM CHLORIDE 0.9% FLUSH
3.0000 mL | Freq: Two times a day (BID) | INTRAVENOUS | Status: DC
  Administered 2016-05-31 – 2016-06-10 (×15): 3 mL via INTRAVENOUS

## 2016-05-31 MED ORDER — SODIUM CHLORIDE 0.9 % IV BOLUS (SEPSIS)
1000.0000 mL | Freq: Once | INTRAVENOUS | Status: DC
  Administered 2016-05-31: 1000 mL via INTRAVENOUS

## 2016-05-31 MED ORDER — FUROSEMIDE 10 MG/ML IJ SOLN
40.0000 mg | Freq: Every day | INTRAMUSCULAR | Status: AC
  Administered 2016-06-01: 40 mg via INTRAVENOUS
  Filled 2016-05-31: qty 4

## 2016-05-31 MED ORDER — ASPIRIN EC 81 MG PO TBEC: 81.0000 mg | DELAYED_RELEASE_TABLET | Freq: Every day | ORAL | Status: DC

## 2016-05-31 MED ORDER — ASPIRIN EC 81 MG PO TBEC
81.0000 mg | DELAYED_RELEASE_TABLET | Freq: Every day | ORAL | Status: DC
  Administered 2016-06-01 – 2016-06-10 (×10): 81 mg via ORAL
  Filled 2016-05-31 (×10): qty 1

## 2016-05-31 MED ORDER — HYDROCORTISONE ACE-PRAMOXINE 1-1 % RE CREA
TOPICAL_CREAM | Freq: Once | RECTAL | Status: DC
  Filled 2016-05-31: qty 30

## 2016-05-31 NOTE — ED Notes (Signed)
Called lab to add on BNP. ?

## 2016-05-31 NOTE — ED Notes (Signed)
Pt denied pain at rectum.  Does not want the Proctocream at this time.

## 2016-05-31 NOTE — ED Notes (Signed)
Lab called with Troponin results:  0.59.  Advised Dr. Particia NearingHaviland.

## 2016-05-31 NOTE — ED Notes (Signed)
Due to recent hospitalization, pt is very anxious about having IV.   States he is a hard stick and previous nurses stuck him too many times.  Advised pt that I will consult with IV team, pt agreeable with plan.

## 2016-05-31 NOTE — ED Provider Notes (Signed)
[facility]-EMERGENCY DEPT Provider Note   CSN: [id number] Arrival date & time: 05/31/16  1200     History   Chief Complaint Chief Complaint  Patient presents with  . Weakness    HPI Connor Vargas is a 56 y.o. male.  Pt is [place] and does speak some [place].  Information is mainly obtained from pt's son who is translating.  Pt presents to the ED today because of increasing weakness and swelling to legs and arms.  The pt was admitted to [facility] from 05/11/16 to 05/17/16.  While there, he was found to have several things they suspect is from hypothyroidism.  The pt was started on synthroid while there and Endocrinology was consulted.  He had acute rhabdomyolysis with myopathy.  CK still elevated at discharge at 4263.  Rheumatology was consulted.  ANA negative.  Pt had a pericardial effusion they suspect was also from hypothyroidism.  Pt is scheduled to see cardiology.  He had hyponatremia, pulmonary edema, transaminitis, and anemia.    Pt had blood drawn at the doctor's office 2 days ago and his CK was back up in the 5000s.  Pt said his swelling is worsening, he has bone pain throughout his body, he has not had an appetite, he feels weak, and he has been unable to walk.  He also has hemorrhoids which are hurting him.           Past Medical History:  Diagnosis Date  . Bell's palsy    1980  . Thyroid disease     Patient Active Problem List   Diagnosis Date Noted  . Pulmonary vascular congestion 05/31/2016  . Acquired hypothyroidism   . External hemorrhoids 04/14/2015  . Syncope 04/13/2015  . CAP (community acquired pneumonia) 04/13/2015  . Weakness 04/13/2015    History reviewed. No pertinent surgical history.     Home Medications    Prior to Admission medications   Medication Sig Start Date End Date Taking? Authorizing Provider  aspirin EC 81 MG tablet Take 81 mg by mouth daily.   Yes Historical Provider, MD  docusate sodium (COLACE) 100 MG capsule Take 1 capsule (100  mg total) by mouth 2 (two) times daily. 04/15/15  Yes Christiane Ha, MD  levothyroxine (SYNTHROID, LEVOTHROID) 175 MCG tablet Take 175 mcg by mouth daily before breakfast.   Yes Historical Provider, MD  senna (SENOKOT) 8.6 MG tablet Take 1 tablet by mouth daily.   Yes Historical Provider, MD  traMADol (ULTRAM) 50 MG tablet Take 50 mg by mouth every 6 (six) hours as needed for moderate pain.   Yes Historical Provider, MD  hydrocortisone (ANUSOL-HC) 25 MG suppository Place 1 suppository (25 mg total) rectally 2 (two) times daily. Patient not taking: Reported on 05/31/2016 04/15/15   Christiane Ha, MD  levofloxacin (LEVAQUIN) 750 MG tablet Take 1 tablet (750 mg total) by mouth daily. Patient not taking: Reported on 05/31/2016 04/15/15   Christiane Ha, MD    Family History Family History  Problem Relation Age of Onset  . Coronary artery disease Mother 16    Social History Social History  Substance Use Topics  . Smoking status: Never Smoker  . Smokeless tobacco: Never Used  . Alcohol use Yes     Allergies   Patient has no known allergies.   Review of Systems Review of Systems  Constitutional: Positive for appetite change and fatigue.  Gastrointestinal:       Hemorrhoids  Musculoskeletal: Positive for arthralgias and myalgias.  Leg swelling  Neurological: Positive for weakness.  All other systems reviewed and are negative.    Physical Exam Updated Vital Signs BP 115/77   Pulse 93   Temp 97.9 F (36.6 C) (Oral)   Resp 23   Ht 6\' 1"  (1.854 m)   Wt 181 lb (82.1 kg)   SpO2 97%   BMI 23.88 kg/m   Physical Exam  Constitutional: He is oriented to person, place, and time. He appears well-developed and well-nourished.  HENT:  Head: Normocephalic and atraumatic.  Right Ear: External ear normal.  Left Ear: External ear normal.  Nose: Nose normal.  Mouth/Throat: Oropharynx is clear and moist.  Eyes: Conjunctivae and EOM are normal. Pupils are equal, round, and  reactive to light.  Neck: Normal range of motion. Neck supple.  Cardiovascular: Regular rhythm, normal heart sounds and intact distal pulses.  Tachycardia present.   Pulmonary/Chest: Effort normal and breath sounds normal.  Abdominal: Soft. Bowel sounds are normal.  Genitourinary: Rectal exam shows external hemorrhoid.  Musculoskeletal: He exhibits edema.  Neurological: He is alert and oriented to person, place, and time.  Skin: Skin is warm.  Psychiatric: He has a normal mood and affect. His behavior is normal. Thought content normal.  Nursing note and vitals reviewed.    ED Treatments / Results  Labs (all labs ordered are listed, but only abnormal results are displayed) Labs Reviewed  COMPREHENSIVE METABOLIC PANEL - Abnormal; Notable for the following:       Result Value   Sodium 125 (*)    Chloride 95 (*)    CO2 21 (*)    Calcium 8.5 (*)    Total Protein 5.9 (*)    Albumin 2.9 (*)    AST 222 (*)    ALT 133 (*)    All other components within normal limits  CBC - Abnormal; Notable for the following:    Hemoglobin 12.6 (*)    HCT 38.2 (*)    All other components within normal limits  URINALYSIS, ROUTINE W REFLEX MICROSCOPIC - Abnormal; Notable for the following:    Hgb urine dipstick MODERATE (*)    Squamous Epithelial / LPF 0-5 (*)    All other components within normal limits  TSH - Abnormal; Notable for the following:    TSH 15.602 (*)    All other components within normal limits  CK - Abnormal; Notable for the following:    Total CK 5,482 (*)    All other components within normal limits  TROPONIN I - Abnormal; Notable for the following:    Troponin I 0.59 (*)    All other components within normal limits  BRAIN NATRIURETIC PEPTIDE  CBC  CREATININE, SERUM  TROPONIN I  TROPONIN I  TROPONIN I    EKG  EKG Interpretation  Date/Time:  Sunday May 31 2016 13:40:50 EST Ventricular Rate:  96 PR Interval:    QRS Duration: 92 QT Interval:  343 QTC  Calculation: 434 R Axis:   73 Text Interpretation:  Sinus rhythm Abnormal lateral Q waves Anterior infarct, old Abnormal T, consider ischemia, lateral leads No significant change since last tracing Confirmed by [facility] MD, Bonnie Roig ([id number]) on 05/31/2016 1:54:30 PM       Radiology Dg Chest 2 View  Result Date: 05/31/2016 CLINICAL DATA:  Generalized weakness.  Shortness of breath. EXAM: CHEST  2 VIEW COMPARISON:  One-view chest x-ray 04/13/2015 FINDINGS: The heart is enlarged. Atherosclerotic calcifications are present at the aortic arch. Diffuse interstitial and airspace disease  is present. Bilateral effusions are present, left greater than right. Left greater than right basilar airspace disease is present. IMPRESSION: 1. Cardiomegaly with interstitial and airspace disease likely reflecting edema and bilateral effusions suggesting congestive heart failure. 2. Left greater than right pleural effusions and airspace disease. While this likely reflects atelectasis, infection is also considered. Electronically Signed   By: Marin Robertshristopher  Mattern M.D.   On: 05/31/2016 14:38    Procedures Procedures (including critical care time)  Medications Ordered in ED Medications  hydrocortisone-pramoxine (PROCTOFOAM-HC) rectal foam 1 applicator (not administered)  hydrocortisone (ANUSOL-HC) suppository 25 mg (not administered)  enoxaparin (LOVENOX) injection 40 mg (not administered)  sodium chloride flush (NS) 0.9 % injection 3 mL (not administered)  oxyCODONE (Oxy IR/ROXICODONE) immediate release tablet 5 mg (not administered)  ondansetron (ZOFRAN) tablet 4 mg (not administered)    Or  ondansetron (ZOFRAN) injection 4 mg (not administered)  furosemide (LASIX) injection 40 mg (not administered)  traZODone (DESYREL) tablet 50 mg (not administered)  furosemide (LASIX) injection 40 mg (40 mg Intravenous Given 05/31/16 1544)  aspirin EC tablet 325 mg (325 mg Oral Given 05/31/16 1544)     Initial Impression /  Assessment and Plan / ED Course  I have reviewed the triage vital signs and the nursing notes.  Pertinent labs & imaging results that were available during my care of the patient were reviewed by me and considered in my medical decision making (see chart for details).    Pt given IVFs for likely rhabdo vs myocarditis.  He is given lasix for fluid overload.  He is given asa.  He was d/w Dr. Izola PriceMyers (hosptialist) for admission.  Final Clinical Impressions(s) / ED Diagnoses   Final diagnoses:  Hyponatremia  Hypoalbuminemia  Transaminitis  Elevated troponin  Acquired hypothyroidism  Myxedema  Non-traumatic rhabdomyolysis  Acute pulmonary edema [facility](H[facility])    New Prescriptions Current Discharge Medication List       Jacalyn LefevreJulie Arnelle Nale, MD 05/31/16 1627

## 2016-05-31 NOTE — ED Triage Notes (Signed)
Pt here for generalized weakness; [facility] and [facility] effusions per family; pt continuing to get more weak and pain in his legs

## 2016-05-31 NOTE — H&P (Addendum)
History and Physical    Connor Vargas ZOX:[id number] DOB: 09/23/1960 DOA: 05/31/2016  Referring MD/NP/PA: Dr. Corena PilgrimHavilar   PCP: Kaleen MaskELKINS,WILSON OLIVER, MD   Patient coming from: Home   Chief Complaint: weakness   HPI: [name] is a 56 y.o. male with recently diagnosed hypothyroidism (few months ago when TSH was reportedly > 90) and his PCP started him on synthroid and the dose increased from initially 50 mcg --> currently 175 mcg but pt continued to decline in terms of overall clinical state despite treatment. He was [facility] about two weeks prior to this admission and was discharged home with same diagnosis of hypothyroidism and ? Myxedema. His medications were not changed and pt was told to follow up with rheumatologist which pt is scheduled to see March 6th. Pt now comes here with progressively worsening weakness, LE and UE swelling, dyspnea exertional and at rest. 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 In addition, pt's says he has not eaten anything in the past two days.   ED Course: Appears tired and weak, in mild distress due to dyspnea. CK level > 5000, initial troponin 0.59. TRH asked to admit for further evaluation.   Review of Systems:  Constitutional: positive appetite change and fatigue.  HENT: Negative for ear pain, nosebleeds, congestion, facial swelling, rhinorrhea, neck pain, neck stiffness and ear discharge.   Eyes: Negative for pain, discharge, redness, itching and visual disturbance.  Respiratory: Negative for cough, choking, chest tightness  Cardiovascular: Negative for chest pain, palpitations, positive for LE swelling  Gastrointestinal: Negative for abdominal distention.  Genitourinary: Negative for dysuria, urgency, frequency, hematuria, flank pain, decreased  urine volume Musculoskeletal: positive for gait problem.  Neurological: Negative for dizziness, tremors, seizures, syncope, facial asymmetry Hematological: Negative for adenopathy. Does not bruise/bleed easily.  Psychiatric/Behavioral: Negative for hallucinations, behavioral problems, confusion, positive for depressed mood   Past Medical History:  Diagnosis Date  . Bell's palsy    1980  . Thyroid disease    Surgical Hx: History reviewed. No pertinent surgical history.  Social Hx:  reports that he has never smoked. He has never used smokeless tobacco. He reports that he drinks alcohol. He reports that he does not use drugs.  No Known Allergies  Family History  Problem Relation Age of Onset  . Coronary artery disease Mother [id number]    Prior to Admission medications   Medication Sig Start Date End Date Taking? Authorizing Provider  docusate sodium (COLACE) 100 MG capsule Take 1 capsule (100 mg total) by mouth 2 (two) times daily. 04/15/15   Christiane Haorinna L Sullivan, MD  hydrocortisone (ANUSOL-HC) 25 MG suppository Place 1 suppository (25 mg total) rectally 2 (two) times daily. 04/15/15   Christiane Haorinna L Sullivan, MD  levofloxacin (LEVAQUIN) 750 MG tablet Take 1 tablet (750 mg total) by mouth daily. 04/15/15   Christiane Haorinna L Sullivan, MD    Physical Exam: Vitals:   05/31/16 1211 05/31/16 1404 05/31/16 1430 05/31/16 1500  BP:  112/76 114/79 115/77  Pulse:  91 93 91  Resp:  21 16 19   Temp:      TempSrc:      SpO2:  98% 97% 99%  Weight: 82.1 kg (181 lb)     Height: 6\' 1"  (1.854 m)  Constitutional: in mild distress due to dyspnea and pain  Vitals:   05/31/16 1211 05/31/16 1404 05/31/16 1430 05/31/16 1500  BP:  112/76 114/79 115/77  Pulse:  91 93 91  Resp:  21 16 19   Temp:      TempSrc:      SpO2:  98% 97% 99%  Weight: 82.1 kg (181 lb)     Height: 6\' 1"  (1.854 m)      Eyes: PERRL, lids and conjunctivae normal ENMT: Mucous membranes are dry. Posterior pharynx clear of any exudate or  lesions.Normal dentition.  Neck: normal, supple, no masses, no thyromegaly Respiratory: bibasilar crackles, mild tachypnea with exertion mostly  Cardiovascular: Regular rate and rhythm, no murmurs / rubs / gallops. +2 bilateral LE edema.  Abdomen: no tenderness, no masses palpated. No hepatosplenomegaly. Bowel sounds positive.  Musculoskeletal: no clubbing / cyanosis. No joint deformity upper and lower extremities. Skin: no rashes, lesions, ulcers. No induration Neurologic: CN 2-12 grossly intact. Sensation intact, DTR normal. Strength 3/5 in lower extremity and exam mostly limited due to pain.  Psychiatric: depressed mood   Labs on Admission: I have personally reviewed following labs and imaging studies  CBC:  Recent Labs Lab 05/31/16 1219  WBC 10.5  HGB 12.6*  HCT 38.2*  MCV 88.0  PLT 339   Basic Metabolic Panel:  Recent Labs Lab 05/31/16 1219  NA 125*  K 4.7  CL 95*  CO2 21*  GLUCOSE 94  BUN 13  CREATININE 0.64  CALCIUM 8.5*   Liver Function Tests:  Recent Labs Lab 05/31/16 1219  AST 222*  ALT 133*  ALKPHOS 60  BILITOT 0.9  PROT 5.9*  ALBUMIN 2.9*   Cardiac Enzymes:  Recent Labs Lab 05/31/16 1309  TROPONINI 0.59*   Thyroid Function Tests:  Recent Labs  05/31/16 1309  TSH 15.602*   Urine analysis:    Component Value Date/Time   COLORURINE YELLOW 05/31/2016 1345   APPEARANCEUR CLEAR 05/31/2016 1345   LABSPEC 1.013 05/31/2016 1345   PHURINE 5.0 05/31/2016 1345   GLUCOSEU NEGATIVE 05/31/2016 1345   HGBUR MODERATE (A) 05/31/2016 1345   BILIRUBINUR NEGATIVE 05/31/2016 1345   KETONESUR NEGATIVE 05/31/2016 1345   PROTEINUR NEGATIVE 05/31/2016 1345   NITRITE NEGATIVE 05/31/2016 1345   LEUKOCYTESUR NEGATIVE 05/31/2016 1345   Radiological Exams on Admission: Dg Chest 2 View  Result Date: 05/31/2016 CLINICAL DATA:  Generalized weakness.  Shortness of breath. EXAM: CHEST  2 VIEW COMPARISON:  One-view chest x-ray 04/13/2015 FINDINGS: The heart is  enlarged. Atherosclerotic calcifications are present at the aortic arch. Diffuse interstitial and airspace disease is present. Bilateral effusions are present, left greater than right. Left greater than right basilar airspace disease is present. IMPRESSION: 1. Cardiomegaly with interstitial and airspace disease likely reflecting edema and bilateral effusions suggesting congestive heart failure. 2. Left greater than right pleural effusions and airspace disease. While this likely reflects atelectasis, infection is also considered. Electronically Signed   By: Marin Roberts M.D.   On: 05/31/2016 14:38    EKG: pending   Assessment/Plan Active Problems:   Weakness - unclear etiology but appears to be multifactorial and secondary to hypothyroidism, rhabdomyolysis, acute pulmonary vascular congestion - will ask for PT and OT eval - after speaking with family and pt, my personal belief is that he would benefit from more intense PT if able to tolerate  - for now treat acute issues     Pulmonary vascular congestion, elevated troponin  - suspect acute diastolic CHF - unclear  if related to hypothyroidism  - trop mildly elevated and pt with no cheat pain at this time - I have reviewed CXR and physical exam findings consistent with acute pulmonary vascular congestion - I have placed pt on Lasix 40 mg IV, stop IVF - reassess clinical status in Am and decide if lasix is needed based on clinical course  - ECHO requested, continue to cycle CE's, pending ECHO results and trop trend, may benefit from cardiology consultation - monitor daily weights and strict I/O     Hyponatremia - not clear of the etiology - currently unable to hydrate due to volume overload, encouraged oral intake  - BMP In AM    Acquired hypothyroidism - TSH is trending down based on record review  - continue synthroid     Rhabdomyolysis - unclear etiology - has been elevated for several weeks now per record review, apparently  lingering around 5000 - pt has an appointment with rheumatologist on March 6th, 2018 - this is now acute process based from what I can tell and I wonder if related to acute hypothyroidism flare (few months back when TSH was > 90) - normally you could try fluid resuscitation but we can not do this due to acute volume overload, I have stopped IVF for now - obtained CK level in AM     Transaminitis - suspect this is related to the above - CMET in AM    External hemorrhoids - continue home medical regimen    CAP (community acquired pneumonia) - noted on CXR but pt with no fever and no cough, I suspect this is mostly related to pleural effusions rather than PNA - CT chest requested for clearer evaluation   DVT prophylaxis: Lovenox SQ Code Status: Full  Family Communication: Pt, wife, son updated at bedside Disposition Plan: Home once medically stable  Consults called: None Admission status: Inpatient   Debbora Presto MD [facility] Pager [phone]  If 7PM-7AM, please contact night-coverage [email or web address] Password [email or web address]  05/31/2016, 3:12 PM

## 2016-05-31 NOTE — Progress Notes (Signed)
Patient arrived from emergency room via stretcher.  Patient assisted with one assist to stand and pivot to the bed. Patient has no complaints of pain.  Son in the room to help translate and educate patient on surroundings, phone, and call bell.

## 2016-06-01 ENCOUNTER — Encounter (HOSPITAL_COMMUNITY): Payer: Self-pay | Admitting: Cardiology

## 2016-06-01 ENCOUNTER — Inpatient Hospital Stay (HOSPITAL_COMMUNITY): Payer: BLUE CROSS/BLUE SHIELD

## 2016-06-01 DIAGNOSIS — I5041 Acute combined systolic (congestive) and diastolic (congestive) heart failure: Secondary | ICD-10-CM

## 2016-06-01 DIAGNOSIS — R531 Weakness: Secondary | ICD-10-CM

## 2016-06-01 DIAGNOSIS — R0989 Other specified symptoms and signs involving the circulatory and respiratory systems: Secondary | ICD-10-CM

## 2016-06-01 DIAGNOSIS — I2584 Coronary atherosclerosis due to calcified coronary lesion: Secondary | ICD-10-CM

## 2016-06-01 DIAGNOSIS — R0600 Dyspnea, unspecified: Secondary | ICD-10-CM

## 2016-06-01 LAB — ECHOCARDIOGRAM COMPLETE
CHL CUP MV DEC (S): 140
EERAT: 8.38
EWDT: 140 ms
FS: 18 % — AB (ref 28–44)
Height: 73 in
IV/PV OW: 1
LA diam end sys: 50 mm
LA diam index: 2.43 cm/m2
LA vol A4C: 93.2 ml
LASIZE: 50 mm
LAVOL: 89.9 mL
LAVOLIN: 43.6 mL/m2
LDCA: 3.14 cm2
LV E/e' medial: 8.38
LV E/e'average: 8.38
LV TDI E'LATERAL: 11.7
LV TDI E'MEDIAL: 3.6
LV e' LATERAL: 11.7 cm/s
LVOT SV: 51 mL
LVOT VTI: 16.2 cm
LVOT diameter: 20 mm
LVOT peak vel: 99.4 cm/s
Lateral S' vel: 12.7 cm/s
MV Peak grad: 4 mmHg
MV pk E vel: 98 m/s
MVPKAVEL: 67.4 m/s
PW: 11 mm — AB (ref 0.6–1.1)
TAPSE: 13.6 mm
Weight: 2904 oz

## 2016-06-01 LAB — CBC
HEMATOCRIT: 33.4 % — AB (ref 39.0–52.0)
HEMOGLOBIN: 11.1 g/dL — AB (ref 13.0–17.0)
MCH: 29.4 pg (ref 26.0–34.0)
MCHC: 33.2 g/dL (ref 30.0–36.0)
MCV: 88.4 fL (ref 78.0–100.0)
Platelets: 275 10*3/uL (ref 150–400)
RBC: 3.78 MIL/uL — AB (ref 4.22–5.81)
RDW: 14.8 % (ref 11.5–15.5)
WBC: 9.5 10*3/uL (ref 4.0–10.5)

## 2016-06-01 LAB — COMPREHENSIVE METABOLIC PANEL
ALBUMIN: 2.6 g/dL — AB (ref 3.5–5.0)
ALT: 112 U/L — ABNORMAL HIGH (ref 17–63)
ANION GAP: 9 (ref 5–15)
AST: 187 U/L — ABNORMAL HIGH (ref 15–41)
Alkaline Phosphatase: 47 U/L (ref 38–126)
BILIRUBIN TOTAL: 0.8 mg/dL (ref 0.3–1.2)
BUN: 14 mg/dL (ref 6–20)
CALCIUM: 8 mg/dL — AB (ref 8.9–10.3)
CO2: 24 mmol/L (ref 22–32)
Chloride: 96 mmol/L — ABNORMAL LOW (ref 101–111)
Creatinine, Ser: 0.7 mg/dL (ref 0.61–1.24)
GLUCOSE: 84 mg/dL (ref 65–99)
POTASSIUM: 4 mmol/L (ref 3.5–5.1)
Sodium: 129 mmol/L — ABNORMAL LOW (ref 135–145)
TOTAL PROTEIN: 4.9 g/dL — AB (ref 6.5–8.1)

## 2016-06-01 LAB — TROPONIN I: Troponin I: 0.61 ng/mL (ref <0.03)

## 2016-06-01 LAB — C-REACTIVE PROTEIN: CRP: 1.5 mg/dL — AB (ref <1.0)

## 2016-06-01 LAB — SEDIMENTATION RATE: SED RATE: 15 mm/h (ref 0–16)

## 2016-06-01 LAB — CORTISOL-AM, BLOOD: CORTISOL - AM: 15.2 ug/dL (ref 6.7–22.6)

## 2016-06-01 LAB — LACTATE DEHYDROGENASE: LDH: 574 U/L — AB (ref 98–192)

## 2016-06-01 LAB — CK: CK TOTAL: 4440 U/L — AB (ref 49–397)

## 2016-06-01 MED ORDER — SPIRONOLACTONE 25 MG PO TABS
12.5000 mg | ORAL_TABLET | Freq: Two times a day (BID) | ORAL | Status: DC
  Administered 2016-06-01 – 2016-06-06 (×10): 12.5 mg via ORAL
  Filled 2016-06-01 (×10): qty 1

## 2016-06-01 MED ORDER — CARVEDILOL 3.125 MG PO TABS
3.1250 mg | ORAL_TABLET | Freq: Two times a day (BID) | ORAL | Status: DC
  Administered 2016-06-02 – 2016-06-03 (×3): 3.125 mg via ORAL
  Filled 2016-06-01 (×3): qty 1

## 2016-06-01 NOTE — Progress Notes (Signed)
PROGRESS NOTE    Connor Vargas  ZOX:[id number] DOB: 07/01/1960 DOA: 05/31/2016 PCP: Kaleen MaskELKINS,WILSON OLIVER, MD   Brief Narrative:   Connor Vargas is a 56 y.o. male with recently diagnosed hypothyroidism (few months ago when TSH was reportedly > 90) and his PCP started him on synthroid and the dose increased from initially 50 mcg --> currently 175 mcg but pt continued to decline in terms of overall clinical state despite treatment. He was sent to [facility] about two weeks prior to this admission and was discharged home with same diagnosis of hypothyroidism and ? Myxedema. His medications were not changed and pt was told to follow up with rheumatologist which pt is scheduled to see March 6th. Pt now comes here with progressively worsening weakness, LE and UE swelling, dyspnea exertional and at rest. 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 In addition, pt's says he has not eaten anything in the past two days.  Echo done in the hospital showed new onset sCHF ef 25%, cardiology consulted.    Assessment & Plan:   Active Problems:   CAP (community acquired pneumonia)   Weakness   External hemorrhoids   Pulmonary vascular congestion   Acquired hypothyroidism   Rhabdomyolysis   Transaminitis  Generalized B/L LE Weakness - unclear etiology but appears to be multifactorial and secondary to hypothyroidism, rhabdomyolysis, acute pulmonary vascular congestion - seen by PT/OT, still weak below his hips at this time but able to bear weight with the help of a walker.  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  - I have placed pt on Lasix 40 mg IV, stop IVF - monitor daily weights and strict I/O  -cardiology consulted for further work up as he may need cath. (calcification of coronaries seen on the CT chest).    Hyponatremia - improving -likely from fluid overload     Acquired hypothyroidism - TSH is trending down based on record review  - continue synthroid     Rhabdomyolysis - improving - unclear etiology - has been elevated for several weeks now per record review, apparently lingering around 5000 - pt has an appointment with rheumatologist on March 6th, 2018 - this is now acute process based from what I can tell and I wonder if related to acute hypothyroidism flare (few months back when TSH was > 90) - no IVF due to his cardiac status.      Transaminitis - suspect this is related to the above - CMET in AM - hep panel ordered- pending.     External hemorrhoids - continue home medical regimen  B/L Moderate Pleural effusion -related to cardiac etiology.   DVT prophylaxis: Lovenox SQ Code Status: Full  Family Communication: Wife at bedside. Will speak with his son as well when he gets in.  Disposition Plan: TBD Consults called: None Admission status: Inpatient    Consultants:   Cardiology today     Subjective: Patient continues to complain of LE swelling, weakness and heaviness. He is a bit upset after finding out his  cardiac echo results but no other complaints at this time.   Objective: Vitals:   05/31/16 1545 05/31/16 1628 05/31/16 1950 06/01/16 0513  BP:  121/75 107/65 114/69  Pulse: 93 97 99 97  Resp: 23 (!) 22 20 20   Temp:  98.9 F (37.2 C) 98.6 F (37 C) 98.4 F (36.9 C)  TempSrc:  Oral Oral Oral  SpO2: 97%  96% 97%  Weight:  83.7 kg (184 lb 8 oz)  82.3 kg (181 lb 8 oz)  Height:        Intake/Output Summary (Last 24 hours) at 06/01/16 1234 Last data filed at 06/01/16 0523  Gross per 24 hour  Intake                0  ml  Output             1350 ml  Net            -1350 ml   Filed Weights   05/31/16 1211 05/31/16 1628 06/01/16 0513  Weight: 82.1 kg (181 lb) 83.7 kg (184 lb 8 oz) 82.3 kg (181 lb 8 oz)    Examination:  General exam: Appears calm and comfortable  Respiratory system: decreased BS at b/l bases.  Cardiovascular system: S1 & S2 heard, RRR. No JVD, murmurs, rubs, gallops or clicks. No pedal edema. Gastrointestinal system: Abdomen is nondistended, soft and nontender. No organomegaly or masses felt. Normal bowel sounds heard. Central nervous system: Alert and oriented. No focal neurological deficits. Extremities: Symmetric 5 x 5 power. 2+ b/L swellig Skin: No rashes, lesions or ulcers Psychiatry: Judgement and insight appear normal. Mood & affect appropriate.     Data Reviewed:   CBC:  Recent Labs Lab 05/31/16 1219 06/01/16 0437  WBC 10.5 9.5  HGB 12.6* 11.1*  HCT 38.2* 33.4*  MCV 88.0 88.4  PLT 339 275   Basic Metabolic Panel:  Recent Labs Lab 05/31/16 1219 06/01/16 0437  NA 125* 129*  K 4.7 4.0  CL 95* 96*  CO2 21* 24  GLUCOSE 94 84  BUN 13 14  CREATININE 0.64 0.70  CALCIUM 8.5* 8.0*   GFR: Estimated Creatinine Clearance: 115.1 mL/min (by C-G formula based on SCr of 0.7 mg/dL). Liver Function Tests:  Recent Labs Lab 05/31/16 1219 06/01/16 0437  AST 222* 187*  ALT 133* 112*  ALKPHOS 60 47  BILITOT 0.9 0.8  PROT 5.9* 4.9*  ALBUMIN 2.9* 2.6*   No results for input(s): LIPASE, AMYLASE in the last 168 hours. No results for input(s): AMMONIA in the last 168 hours. Coagulation Profile: No results for input(s): INR, PROTIME in the last 168 hours. Cardiac Enzymes:  Recent Labs Lab 05/31/16 1309 05/31/16 1715 05/31/16 2212 06/01/16 0437  CKTOTAL 5,482*  --   --  4,440*  TROPONINI 0.59* 0.53* 0.80* 0.61*   BNP (last 3 results) No results for input(s): PROBNP in the last 8760 hours. HbA1C: No results for input(s): HGBA1C in the last 72  hours. CBG: No results for input(s): GLUCAP in the last 168 hours. Lipid Profile: No results for input(s): CHOL, HDL, LDLCALC, TRIG, CHOLHDL, LDLDIRECT in the last 72 hours. Thyroid Function Tests:  Recent Labs  05/31/16 1309  TSH 15.602*   Anemia Panel: No results for input(s): VITAMINB12, FOLATE, FERRITIN, TIBC, IRON, RETICCTPCT in the last 72 hours. Sepsis Labs: No results for input(s): PROCALCITON, LATICACIDVEN in the last 168 hours.  No results found for this or any previous visit (from the past 240 hour(s)).  Radiology Studies: Dg Chest 2 View  Result Date: 05/31/2016 CLINICAL DATA:  Generalized weakness.  Shortness of breath. EXAM: CHEST  2 VIEW COMPARISON:  One-view chest x-ray 04/13/2015 FINDINGS: The heart is enlarged. Atherosclerotic calcifications are present at the aortic arch. Diffuse interstitial and airspace disease is present. Bilateral effusions are present, left greater than right. Left greater than right basilar airspace disease is present. IMPRESSION: 1. Cardiomegaly with interstitial and airspace disease likely reflecting edema and bilateral effusions suggesting congestive heart failure. 2. Left greater than right pleural effusions and airspace disease. While this likely reflects atelectasis, infection is also considered. Electronically Signed   By: Marin Roberts M.D.   On: 05/31/2016 14:38   Ct Chest Wo Contrast  Result Date: 05/31/2016 CLINICAL DATA:  Dyspnea EXAM: CT CHEST WITHOUT CONTRAST TECHNIQUE: Multidetector CT imaging of the chest was performed following the standard protocol without IV contrast. COMPARISON:  Chest x-ray 05/31/2016 FINDINGS: Cardiovascular: Limited vascular evaluation without intravenous contrast. Negative for aortic aneurysm. Mild atherosclerotic disease aortic arch. Extensive calcification in the left main and left anterior descending coronary artery and in the circumflex. Right coronary calcification. Heart size normal.  Small pericardial effusion. Mediastinum/Nodes: No mass or adenopathy Lungs/Pleura: Moderate pleural effusion bilaterally. Bibasilar airspace disease may represent pneumonia or atelectasis. There is dense material in the lower lobe airways bilaterally which is likely aspirated material. No high-density material in the stomach. Upper lobes relatively clear. Upper Abdomen: Negative Musculoskeletal: Negative IMPRESSION: Moderately large bilateral pleural effusions. Small pericardial effusion Extensive coronary artery disease with heavy calcification left main and left coronary artery. Right coronary artery also calcified Bibasilar airspace disease which may be atelectasis or infiltrate. There is high-density material in the lower lobe airway bilaterally which is likely aspirated material, possibly barium or other ingested material. No high-density material is seen in the stomach at this time. Electronically Signed   By: Marlan Palau M.D.   On: 05/31/2016 21:21        Scheduled Meds: . aspirin EC  81 mg Oral Daily  . enoxaparin (LOVENOX) injection  40 mg Subcutaneous Q24H  . hydrocortisone  25 mg Rectal BID  . hydrocortisone-pramoxine  1 applicator Rectal Once  . levothyroxine  175 mcg Oral QAC breakfast  . sodium chloride flush  3 mL Intravenous Q12H   Continuous Infusions:   LOS: 1 day    Time spent: 35 mins     Sheridan Hew Joline Maxcy, MD [facility] Pager [phone]   If 7PM-7AM, please contact night-coverage [email or web address] Password [facility] 06/01/2016, 12:34 PM

## 2016-06-01 NOTE — Consult Note (Addendum)
Cardiology Consult    Patient ID: Navraj Dreibelbis MRN: [id number], DOB/AGE: 1960-11-25   Admit date: 05/31/2016 Date of Consult: 06/01/2016  Primary Physician: Kaleen Mask, MD Reason for Consult: LV dysfunction Primary Cardiologist: New Requesting Provider: Dr. Nelson Chimes  History of Present Illness    Rylen Salsgiver is a 56 y.o. male with past medical history significant for recently diagnosed hypothyroidism and Bell's Palsy in 1980. He was diagnosed with hypothyroid a few months ago with TSH reportedly >90 and his PCP started him on synthroid. We have been asked to consult for newl LV dysfunction with EF 25-30%.   He has had muscle weakness and pain and fatigue for over a month. He has had no chest pain. He has mild DOE but he feels that the muscle weakness is what primarily limits his activity. He has edema for about a month and mild orthopnea. No palpitations or syncope. He has no HTN, DM or historyof cardiac issues. He has been told that he has mildly elevated cholesterol. His mother had multiple MI's and died at the age of 3. His father is living at the age of 40 with no cardiac issues. He has never smoked or consumed alcohol.   Labs this admission: BNP 1328.2. Troponins 0.59, 0.53, 0.80, 0.61. CK 5,482. LDH 574. TSH 15.602. AM Cortisol 15.2. Sed Rate 15  CXR: 1. Cardiomegaly with interstitial and airspace disease likely reflecting edema and bilateral effusions suggesting congestive heart failure. 2. Left greater than right pleural effusions and airspace disease. While this likely reflects atelectasis, infection is also considered.  At [facility] 2/9-2/11/18 Pt was found to again by hypothyroid with rhabdomyolysis thought to be secondary to thyroid disease. He also had a moderate loculated pericardial effusion with preserved EF thought to be also related to the thyroid dysfunction. He was set up with rheumatology outpatient with an appointment on 06/09/16 with Dr. Ramond Craver with concern for  dermatomyositis (ANA was negative).  He is readmitted with worsening weakness and inability to stand. His echo done today indicates a new decrease in LV function with EF 25-30% with septal apical inferior and distal anterior wall hypokinesis The cavity size was severely dilated, Wall thickness was normal, mild AR, mild MR, moderate to severely dilated LA appendage, Small to moderate posterior lateral pericardial effusion, no tamponade, moderate left pleural effusion.   Previous labs at [facility] 05/17/2016- CK:  4,629 05/17/2016 AST: 208, ALT: 98 05/16/2016 Sodium: 130 05/15/2016 ANA negative 05/13/2016 Cortisol: 5.9 05/12/2016 TSH:  36.989 05/12/2016 Hepatitis A antibody: reactive (A) 05/12/2016 Thyroid peroxidase antibodies: 7 (<9IU/ml) 05/11/2016 BNP: 346  05/13/2016 82 <130 mg/dL  Total Cholesterol 409 25 - 199 MG/DL  Triglycerides 811 (H) 10 - 150 MG/DL  HDL Cholesterol 21 (L) 35 - 135 MG/DL  Total Chol / HDL Cholesterol 6.6 (H    Echo at [facility] 05/22/2016:  The left ventricular size is normal. Basal left ventricular septal   hypertrophy. There is mild asymmetric left ventricular hypertrophy. Left   ventricular systolic function is low normal. LV ejection fraction = 50-55%.   Left ventricular filling pattern is indeterminate. The left ventricular wall   motion is normal. Mild LVH. Moderate sized pericardial effusion.  Echo at [facility] 04/13/2016: Low normal LV systolic function; severe LVH; grade 1 diastolic   dysfunction; mild LAE; mildly calcified aortic valve; thickened   MV with trace MR; small pericardial effusion. EF 50-55%     Past Medical History   Past Medical History:  Diagnosis Date  .  Bell's palsy    1980  . Thyroid disease     History reviewed. No pertinent surgical history.   Allergies  No Known Allergies  Inpatient Medications    . aspirin EC  81 mg Oral Daily  . enoxaparin (LOVENOX) injection  40 mg Subcutaneous Q24H  . hydrocortisone  25 mg  Rectal BID  . hydrocortisone-pramoxine  1 applicator Rectal Once  . levothyroxine  175 mcg Oral QAC breakfast  . sodium chloride flush  3 mL Intravenous Q12H    Family History    Family History  Problem Relation Age of Onset  . Coronary artery disease Mother 34    Social History    Social History   Social History  . Marital status: Married    Spouse name: N/A  . Number of children: N/A  . Years of education: N/A   Occupational History  . Not on file.   Social History Main Topics  . Smoking status: Never Smoker  . Smokeless tobacco: Never Used  . Alcohol use Yes  . Drug use: No  . Sexual activity: Not on file   Other Topics Concern  . Not on file   Social History Narrative  . No narrative on file     Review of Systems   General:  No chills, fever, night sweats or weight changes. Generalized weakness and muscle pain. Cardiovascular:  Positive for orhopnea and edema for the past monthNo chest pain, dyspnea on exertion, palpitations, paroxysmal nocturnal dyspnea. Dermatological: No rash, lesions/masses Respiratory: No cough, dyspnea Urologic: No hematuria, dysuria Abdominal:   No nausea, vomiting. Positive for constipation and hemorrhoids.  Neurologic:  No visual changes, wkns, changes in mental status. All other systems reviewed and are otherwise negative except as noted above.  Physical Exam   Blood pressure 114/69, pulse 97, temperature 98.4 F (36.9 C), temperature source Oral, resp. rate 20, height 6\' 1"  (1.854 m), weight 181 lb 8 oz (82.3 kg), SpO2 97 %.  General: Pleasant, NAD Psych: Normal affect. Neuro: Alert and oriented X 3. Moves all extremities spontaneously. HEENT: Normal  Neck: Supple without bruits or JVD. Lungs:  Resp regular and unlabored, CTA. Heart: RRR no s3, s4, or murmurs. Abdomen: Soft, non-tender, non-distended, BS + x 4.  Extremities: No clubbing or cyanosis. 1+ pitting edema of lower legs. Non pitting edema of hands/forearms.  DP/PT/Radials 2+ and equal bilaterally.  Labs    Troponin (Point of Care Test) No results for input(s): TROPIPOC in the last 72 hours.  Recent Labs  05/31/16 1309 05/31/16 1715 05/31/16 2212 06/01/16 0437  CKTOTAL 5,482*  --   --  4,440*  TROPONINI 0.59* 0.53* 0.80* 0.61*   Lab Results  Component Value Date   WBC 9.5 06/01/2016   HGB 11.1 (L) 06/01/2016   HCT 33.4 (L) 06/01/2016   MCV 88.4 06/01/2016   PLT 275 06/01/2016     Recent Labs Lab 06/01/16 0437  NA 129*  K 4.0  CL 96*  CO2 24  BUN 14  CREATININE 0.70  CALCIUM 8.0*  PROT 4.9*  BILITOT 0.8  ALKPHOS 47  ALT 112*  AST 187*  GLUCOSE 84   Lab Results  Component Value Date   CHOL 243 (H) 04/14/2015   HDL 43 04/14/2015   LDLCALC 178 (H) 04/14/2015   TRIG 112 04/14/2015   Lab Results  Component Value Date   DDIMER 0.28 04/13/2015     Radiology Studies    Dg Chest 2 View  Result Date: 05/31/2016  CLINICAL DATA:  Generalized weakness.  Shortness of breath. EXAM: CHEST  2 VIEW COMPARISON:  One-view chest x-ray 04/13/2015 FINDINGS: The heart is enlarged. Atherosclerotic calcifications are present at the aortic arch. Diffuse interstitial and airspace disease is present. Bilateral effusions are present, left greater than right. Left greater than right basilar airspace disease is present. IMPRESSION: 1. Cardiomegaly with interstitial and airspace disease likely reflecting edema and bilateral effusions suggesting congestive heart failure. 2. Left greater than right pleural effusions and airspace disease. While this likely reflects atelectasis, infection is also considered. Electronically Signed   By: Marin Roberts M.D.   On: 05/31/2016 14:38   Ct Chest Wo Contrast  Result Date: 05/31/2016 CLINICAL DATA:  Dyspnea EXAM: CT CHEST WITHOUT CONTRAST TECHNIQUE: Multidetector CT imaging of the chest was performed following the standard protocol without IV contrast. COMPARISON:  Chest x-ray 05/31/2016 FINDINGS:  Cardiovascular: Limited vascular evaluation without intravenous contrast. Negative for aortic aneurysm. Mild atherosclerotic disease aortic arch. Extensive calcification in the left main and left anterior descending coronary artery and in the circumflex. Right coronary calcification. Heart size normal. Small pericardial effusion. Mediastinum/Nodes: No mass or adenopathy Lungs/Pleura: Moderate pleural effusion bilaterally. Bibasilar airspace disease may represent pneumonia or atelectasis. There is dense material in the lower lobe airways bilaterally which is likely aspirated material. No high-density material in the stomach. Upper lobes relatively clear. Upper Abdomen: Negative Musculoskeletal: Negative IMPRESSION: Moderately large bilateral pleural effusions. Small pericardial effusion Extensive coronary artery disease with heavy calcification left main and left coronary artery. Right coronary artery also calcified Bibasilar airspace disease which may be atelectasis or infiltrate. There is high-density material in the lower lobe airway bilaterally which is likely aspirated material, possibly barium or other ingested material. No high-density material is seen in the stomach at this time. Electronically Signed   By: Marlan Palau M.D.   On: 05/31/2016 21:21    EKG & Cardiac Imaging   EKG: Sinus rhythm at 96 bpm, Q waves inferiorly new from 04/2015, Non-specific T wave abnormality laterally  Echocardiogram:   Echo 06/01/2016:  Study Conclusions - Left ventricle: Septal apical inferior and distal anterior wall   hypokinesis The cavity size was severely dilated. Wall thickness   was normal. Systolic function was severely reduced. The estimated   ejection fraction was in the range of 25% to 30%. Left   ventricular diastolic function parameters were normal. - Aortic valve: There was mild regurgitation. - Mitral valve: There was mild regurgitation. - Left atrium: The appendage was moderately to severely  dilated. - Atrial septum: No defect or patent foramen ovale was identified. - Pericardium, extracardiac: Small to moderate posterior lateral pericardial effusion no tamponade Moderate left pleural effusion.  Echo at [facility] 05/22/2016:  The left ventricular size is normal. Basal left ventricular septal   hypertrophy. There is mild asymmetric left ventricular hypertrophy. Left   ventricular systolic function is low normal. LV ejection fraction = 50-55%.   Left ventricular filling pattern is indeterminate. The left ventricular wall   motion is normal. Mild LVH. Moderate sized pericardial effusion.  Echo at [facility] 04/13/2016: Low normal LV systolic function; severe LVH; grade 1 diastolic   dysfunction; mild LAE; mildly calcified aortic valve; thickened   MV with trace MR; small pericardial effusion. EF 50-55%   Assessment & Plan    Acute systolic and diastolic dysfunction -Pt with new hypothyroidism. Mild orthopnea. Peripheral edema. -EF 25-30%, down from 50-55% on 05/22/2016 -BNP 1328.2. Troponins 0.59, 0.53, 0.80, 0.61 -CXR: Moderately large bilateral  pleural effusions. Small pericardial effusion Extensive coronary artery disease with heavy calcification left main and left coronary artery. Right coronary artery also calcified Bibasilar airspace disease which may be atelectasis or infiltrate. -The patient will need right and left cardiac catheterization for further evaluation of new LV dysfunction to assess for ischemic cause of dysfunction. May need a biopsy to investigate infiltrative process. Will defer to cardiologist. SCr 0.70. -Received lasix 40 mg IV daily with improvement in breathing. Continue daily and monitor labs. -I&O net negative 1350 ml since admission. Wt net unchanged. -May consider adding ACE-I, low dose as BP allows and low dose beta blocker (HR 90's-100's). Continue aspirin.   Rhabdomyolysis -LDH 5,482--> 4,440 Has been elevated for several weeks -Was given IV  fluids until found to have severe LV dysfunction and IV fluids stopped.  Hypothyroidism -TSH was >90 when diagnosed  A month or so ago. Started on synthroid. Was 36.989 on 05/12/2016 -05/31/2016 TSH 15.602 -Managed by IM. Levothyroxine 175 mcg daily   SignedBerton Bon, NP-C 06/01/2016, 3:05 PM Pager: [phone]   Patient seen and examined. Agree with assessment and plan. Mr. Nardozzi Is a very pleasant 56 year old gentleman [place]  He had recently developed profound hypothyroidism with a TSH greater than 90 when diagnosed over one month ago.  He has a remote history of Bell's palsy.  He has been on thyroid replacement therapy.  Over the past month and had noticed significant muscle weakness, pain and fatigability.  He has a very strong family history for premature CAD with his mother suffering multiple myocardial infarctions and dying at age 28.  On April 12 2016, an ECG independently reviewed by me showed normal sinus rhythm at 90 bpm, and he had normal R-wave progression across his anterior precordium.  An echo Doppler study on 04/13/2016 showed low-normal systolic function with an EF of 50-55% with significant left ventricular hypertrophy and grade 1 diastolic dysfunction.  His mild aortic sclerosis.  A small pericardial effusion.  He recently was hospitalized at [facility].  A subsequent echo on 05/22/2016 showed an EF of 50-55%.  He was noted have moderate size pericardial effusion.  He was found to have significant CPK elevation at 4629 with LFT elevation.  He was presumed to have rhabdomyolysis.  His renal function has been normal.  He has continued to have transaminase elevation.  He was admitted with progressive weakness and subsequent laboratory is notable for hyponatremia with a serum sodium of 125, CHF with a BNP of 1328, and troponins that are mildly positive.  CPK is further increased at 5482.  His echo Doppler study, now shows reduced LV function with an EF of  25-30%.  His ECG done yesterday now shows normal sinus rhythm at 86 bpm, but he now has Q waves in V1 through V4 consistent with probable anterior infarction.  Q waves are noted in III and F.  The patient denies any awareness of chest pressure.  A chest x-ray reveals extensive coronary artery disease with heavy calcification of his left main and left coronary artery with also RCA calcification.  He has a moderately large bilateral pleural effusion and small pericardial effusion.  On exam, he appears hypothyroid.  He is edematous.  JVD is 8 cm.  There are bilateral rales.  Rhythm was regular in the 90s with 1/6 systolic murmur.  Abdomen was nontender.  He had 1+ pitting edema in his legs to his knees.  Neurologic exam is grossly nonfocal.  Presently, the  patient should be treated with IV diuresis.  He may have myxedema from his severe hypothyroidism, but I'm concerned that he may have suffered an recent anterior wall myocardial infarction contributing to his marked global LV dysfunction versus a myocarditis contributed by his thyroid abnormality.  I will initiate spironolactone along with carvedilol and a blood pressure allows initiation of ACE or ARB therapy.  He ultimately will require a right and left heart cardiac catheterization once more stable.   Lennette Biharihomas A. Lyndell Gillyard, MD, [facility] Audubon HospitalFACC 06/01/2016 6:03 PM

## 2016-06-01 NOTE — Progress Notes (Signed)
Rehabilitation consult requested chart reviewed. Patient admitted 05/31/2016 and workup ongoing for reports of diffuse weakness. Await physical and occupational therapy evaluations and ongoing workup as dictated by [facility] hospitalist and then follow-up with appropriate rehabilitation consultation and recommendations

## 2016-06-01 NOTE — Progress Notes (Signed)
Rehab Admissions Coordinator Note:  Patient was screened by Trish MageLogue, Tauheedah Bok M for appropriateness for an Inpatient Acute Rehab Consult.  At this time, an inpatient rehab consult has been ordered and is pending completion.  Weldon PickingSusan Blankenship will follow-up once consult is completed and she can be reached at [phone]  Trish MageLogue, Alahna Dunne M 06/01/2016, 2:37 PM  I can be reached at [phone].

## 2016-06-01 NOTE — Progress Notes (Signed)
  Echocardiogram 2D Echocardiogram has been performed.  Connor Vargas, Connor Vargas 06/01/2016, 9:45 AM

## 2016-06-01 NOTE — Consult Note (Signed)
Physical Medicine and Rehabilitation Consult Reason for Consult: Debilitation/rhabdomyolysis/multi-medical Referring Physician: [facility]   HPI: Connor Vargas is a 56 y.o. right handed male with history of recent diagnosed hypothyroidism with TSH a few months ago reportedly greater than 90. History taken from chart review and patient. Placed on hormone supplement and titrated but continued to decline with generalized weakness. He was sent to [facility] about 2 weeks prior was later discharged to home. He was scheduled to meet with a rheumatologist March 6. Presented 05/31/2016 with progressive weakness lower extremities as well as shortness of breath. 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 He lives with his wife in a split-level home. Found to be hyponatremic 125, BNP 1328, TSH 15.602, CK 5482, sedimentation rate 15, troponin 0.59. CT of the chest without contrast showed moderately large bilateral pleural effusions. Extensive coronary artery disease with heavy calcification left main and left coronary artery. Echocardiogram with ejection fraction of 30%. Placed on intravenous Lasix. Cardiology services consulted question plan for need cardiac catheterization. 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 Patient remains on Synthroid as advised. Placed on subcutaneous Lovenox for DVT prophylaxis. Tolerating a regular diet. Therapy evaluation completed 06/01/2016 with recommendations of physical medicine rehabilitation consult.   Review of Systems  Constitutional: Positive for malaise/fatigue. Negative for chills and fever.  HENT: Negative for hearing loss and tinnitus.   Eyes: Negative for blurred vision and double vision.  Respiratory: Positive for shortness of breath. Negative for cough.   Cardiovascular: Positive for leg swelling. Negative for chest pain and palpitations.  Gastrointestinal: Positive for  constipation. Negative for nausea and vomiting.  Genitourinary: Negative for dysuria, flank pain and hematuria.  Musculoskeletal: Positive for myalgias.  Skin: Negative for rash.  Neurological: Positive for weakness. Negative for seizures and loss of consciousness.  All other systems reviewed and are negative.  Past Medical History:  Diagnosis Date  . Bell's palsy    1980  . Thyroid disease    History reviewed. No pertinent surgical history. Family History  Problem Relation Age of Onset  . Coronary artery disease Mother 31   Social History:  reports that he has never smoked. He has never used smokeless tobacco. He reports that he drinks alcohol. He reports that he does not use drugs. Allergies: No Known Allergies Medications Prior to Admission  Medication Sig Dispense Refill  . aspirin EC 81 MG tablet Take 81 mg by mouth daily.    Marland Kitchen docusate sodium (COLACE) 100 MG capsule Take 1 capsule (100 mg total) by mouth 2 (two) times daily. 10 capsule 0  . levothyroxine (SYNTHROID, LEVOTHROID) 175 MCG tablet Take 175 mcg by mouth daily before breakfast.    . senna (SENOKOT) 8.6 MG tablet Take 1 tablet by mouth daily.    . traMADol (ULTRAM) 50 MG tablet Take 50 mg by mouth every 6 (six) hours as needed for moderate pain.    . hydrocortisone (ANUSOL-HC) 25 MG suppository Place 1 suppository (25 mg total) rectally 2 (two) times daily. (Patient not taking: Reported on 05/31/2016) 12 suppository 0  . levofloxacin (LEVAQUIN) 750 MG tablet Take 1 tablet (750 mg total) by mouth daily. (Patient not taking: Reported on 05/31/2016) 4 tablet 0    Home: Home Living Family/patient expects to be discharged to:: Private residence Living Arrangements: Spouse/significant other, Children Available Help at Discharge: Family, Available PRN/intermittently Home Equipment: None  Functional History:  Prior Function Level of Independence: Independent Comments: Works full time Functional Status:  Mobility: Bed  Mobility Overal bed mobility: Needs Assistance Bed Mobility: Sit to Supine Sit to supine: Mod assist General bed mobility comments: A for legs only and pt did use rail Transfers Overall transfer level: Needs assistance Equipment used: Rolling walker (2 wheeled) Transfers: Sit to/from Stand Sit to Stand: Min assist General transfer comment: from recliner      ADL: ADL Overall ADL's : Needs assistance/impaired Eating/Feeding: Set up, Supervision/ safety, Sitting Eating/Feeding Details (indicate cue type and reason): increased time Grooming: Supervision/safety, Set up, Sitting Grooming Details (indicate cue type and reason): increased time Upper Body Bathing: Set up, Supervision/ safety, Sitting Upper Body Bathing Details (indicate cue type and reason): increased time Lower Body Bathing: Moderate assistance Lower Body Bathing Details (indicate cue type and reason): min A sit<>stand; increased time Upper Body Dressing : Minimal assistance, Sitting Upper Body Dressing Details (indicate cue type and reason): increased time Lower Body Dressing: Maximal assistance Lower Body Dressing Details (indicate cue type and reason): min A sit<>stand; increased time Toilet Transfer: Minimal assistance, Ambulation, RW Toilet Transfer Details (indicate cue type and reason): recliner>turn to bed Toileting- Clothing Manipulation and Hygiene: Moderate assistance Toileting - Clothing Manipulation Details (indicate cue type and reason): min A sit<>stand  Cognition: Cognition Overall Cognitive Status: Within Functional Limits for tasks assessed Cognition Arousal/Alertness: Awake/alert Behavior During Therapy: WFL for tasks assessed/performed Overall Cognitive Status: Within Functional Limits for tasks assessed  Blood pressure 114/69, pulse 97, temperature 98.4 F (36.9 C), temperature source Oral, resp. rate 20, height 6\' 1"  (1.854 m), weight 82.3 kg (181 lb 8 oz), SpO2 97 %. Physical Exam  Vitals  reviewed. Constitutional: He is oriented to person, place, and time. He appears well-developed and well-nourished.  HENT:  Head: Normocephalic and atraumatic.  Eyes: Conjunctivae and EOM are normal. Right eye exhibits no discharge. Left eye exhibits no discharge.  Neck: Normal range of motion. Neck supple. No thyromegaly present.  Cardiovascular: Normal rate and regular rhythm.   Respiratory: Effort normal and breath sounds normal. No respiratory distress.  GI: Soft. Bowel sounds are normal. He exhibits no distension.  Musculoskeletal: He exhibits edema (LLE>RLE, LUE). He exhibits no tenderness.  +2 edema lower extremities  Neurological: He is alert and oriented to person, place, and time.  Motor: B/l UE 4/5 proximal to distal RLE: HF 2+/5, KE 4/5, ADF/PF 4/5 LLE: HF 2/5, KE 4/5, ADF/PF 4/5 Sensation intact to light touch  Skin: Skin is warm and dry.  Psychiatric: He has a normal mood and affect. His behavior is normal.    Results for orders placed or performed during the hospital encounter of 05/31/16 (from the past 24 hour(s))  Troponin I     Status: Abnormal   Collection Time: 05/31/16  5:15 PM  Result Value Ref Range   Troponin I 0.53 (HH) <0.03 ng/mL  Troponin I     Status: Abnormal   Collection Time: 05/31/16 10:12 PM  Result Value Ref Range   Troponin I 0.80 (HH) <0.03 ng/mL  Troponin I     Status: Abnormal   Collection Time: 06/01/16  4:37 AM  Result Value Ref Range   Troponin I 0.61 (HH) <0.03 ng/mL  Comprehensive metabolic panel     Status: Abnormal   Collection Time: 06/01/16  4:37 AM  Result Value Ref Range   Sodium 129 (L) 135 - 145 mmol/L   Potassium 4.0 3.5 - 5.1 mmol/L   Chloride 96 (  L) 101 - 111 mmol/L   CO2 24 22 - 32 mmol/L   Glucose, Bld 84 65 - 99 mg/dL   BUN 14 6 - 20 mg/dL   Creatinine, Ser 1.61 0.61 - 1.24 mg/dL   Calcium 8.0 (L) 8.9 - 10.3 mg/dL   Total Protein 4.9 (L) 6.5 - 8.1 g/dL   Albumin 2.6 (L) 3.5 - 5.0 g/dL   AST 096 (H) 15 - 41 U/L    ALT 112 (H) 17 - 63 U/L   Alkaline Phosphatase 47 38 - 126 U/L   Total Bilirubin 0.8 0.3 - 1.2 mg/dL   GFR calc non Af Amer >60 >60 mL/min   GFR calc Af Amer >60 >60 mL/min   Anion gap 9 5 - 15  CBC     Status: Abnormal   Collection Time: 06/01/16  4:37 AM  Result Value Ref Range   WBC 9.5 4.0 - 10.5 K/uL   RBC 3.78 (L) 4.22 - 5.81 MIL/uL   Hemoglobin 11.1 (L) 13.0 - 17.0 g/dL   HCT 04.5 (L) 40.9 - 81.1 %   MCV 88.4 78.0 - 100.0 fL   MCH 29.4 26.0 - 34.0 pg   MCHC 33.2 30.0 - 36.0 g/dL   RDW 91.4 78.2 - 95.6 %   Platelets 275 150 - 400 K/uL  CK     Status: Abnormal   Collection Time: 06/01/16  4:37 AM  Result Value Ref Range   Total CK 4,440 (H) 49 - 397 U/L  Cortisol-am, blood     Status: None   Collection Time: 06/01/16  8:33 AM  Result Value Ref Range   Cortisol - AM 15.2 6.7 - 22.6 ug/dL  Sedimentation rate     Status: None   Collection Time: 06/01/16  8:33 AM  Result Value Ref Range   Sed Rate 15 0 - 16 mm/hr  C-reactive protein     Status: Abnormal   Collection Time: 06/01/16  8:33 AM  Result Value Ref Range   CRP 1.5 (H) <1.0 mg/dL  Lactate dehydrogenase     Status: Abnormal   Collection Time: 06/01/16  8:33 AM  Result Value Ref Range   LDH 574 (H) 98 - 192 U/L   Dg Chest 2 View  Result Date: 05/31/2016 CLINICAL DATA:  Generalized weakness.  Shortness of breath. EXAM: CHEST  2 VIEW COMPARISON:  One-view chest x-ray 04/13/2015 FINDINGS: The heart is enlarged. Atherosclerotic calcifications are present at the aortic arch. Diffuse interstitial and airspace disease is present. Bilateral effusions are present, left greater than right. Left greater than right basilar airspace disease is present. IMPRESSION: 1. Cardiomegaly with interstitial and airspace disease likely reflecting edema and bilateral effusions suggesting congestive heart failure. 2. Left greater than right pleural effusions and airspace disease. While this likely reflects atelectasis, infection is also  considered. Electronically Signed   By: Marin Roberts M.D.   On: 05/31/2016 14:38   Ct Chest Wo Contrast  Result Date: 05/31/2016 CLINICAL DATA:  Dyspnea EXAM: CT CHEST WITHOUT CONTRAST TECHNIQUE: Multidetector CT imaging of the chest was performed following the standard protocol without IV contrast. COMPARISON:  Chest x-ray 05/31/2016 FINDINGS: Cardiovascular: Limited vascular evaluation without intravenous contrast. Negative for aortic aneurysm. Mild atherosclerotic disease aortic arch. Extensive calcification in the left main and left anterior descending coronary artery and in the circumflex. Right coronary calcification. Heart size normal. Small pericardial effusion. Mediastinum/Nodes: No mass or adenopathy Lungs/Pleura: Moderate pleural effusion bilaterally. Bibasilar airspace disease may represent pneumonia or atelectasis.  There is dense material in the lower lobe airways bilaterally which is likely aspirated material. No high-density material in the stomach. Upper lobes relatively clear. Upper Abdomen: Negative Musculoskeletal: Negative IMPRESSION: Moderately large bilateral pleural effusions. Small pericardial effusion Extensive coronary artery disease with heavy calcification left main and left coronary artery. Right coronary artery also calcified Bibasilar airspace disease which may be atelectasis or infiltrate. There is high-density material in the lower lobe airway bilaterally which is likely aspirated material, possibly barium or other ingested material. No high-density material is seen in the stomach at this time. Electronically Signed   By: Marlan Palauharles  Clark M.D.   On: 05/31/2016 21:21    Assessment/Plan: Diagnosis: Debilitation Labs and images independently reviewed.  Records reviewed and summated above.  1. Does the need for close, 24 hr/day medical supervision in concert with the patient's rehab needs make it unreasonable for this patient to be served in a less intensive setting?  Yes 2. Co-Morbidities requiring supervision/potential complications: hypothyroidism (cont meds), rhabdomyolysis (cont IVF, monitor labs), coronary artery disease (with hx of MI, cont meds, recs per cards, ?Cath), systolic CHF (Monitor in accordance with increased physical activity and avoid UE resistance excercises), Transaminitis (cont to monitor LFTs), hyponatremia (cont to monitor, supplement as necessary), elevated troponins (recs per cards) 3. Due to skin/wound care, disease management and patient education, does the patient require 24 hr/day rehab nursing? Yes 4. Does the patient require coordinated care of a physician, rehab nurse, PT (1-2 hrs/day, 5 days/week) and OT (1-2 hrs/day, 5 days/week) to address physical and functional deficits in the context of the above medical diagnosis(es)? Yes Addressing deficits in the following areas: balance, endurance, locomotion, strength, transferring, bathing, dressing, toileting and psychosocial support 5. Can the patient actively participate in an intensive therapy program of at least 3 hrs of therapy per day at least 5 days per week? Yes 6. The potential for patient to make measurable gains while on inpatient rehab is excellent 7. Anticipated functional outcomes upon discharge from inpatient rehab are modified independent  with PT, modified independent with OT, n/a with SLP. 8. Estimated rehab length of stay to reach the above functional goals is: 12-17 days. 9. Does the patient have adequate social supports and living environment to accommodate these discharge functional goals? Yes 10. Anticipated D/C setting: Home 11. Anticipated post D/C treatments: HH therapy and Home excercise program 12. Overall Rehab/Functional Prognosis: excellent  RECOMMENDATIONS: This patient's condition is appropriate for continued rehabilitative care in the following setting: If pt continues to have functional deficits after completion of medical workup, recommend CIR.    Patient has agreed to participate in recommended program. Yes Note that insurance prior authorization may be required for reimbursement for recommended care.  Comment: Rehab Admissions Coordinator to follow up.  Maryla MorrowAnkit Quetzal Meany, MD, [place] DomFAAPMR Charlton AmorANGIULLI,DANIEL J., PA-C 06/01/2016

## 2016-06-01 NOTE — Evaluation (Signed)
Physical Therapy Evaluation Patient Details Name: Connor Vargas MRN: [id number] DOB: [birth year] Today's Date: 06/01/2016   History of Present Illness   Connor Vargas is a 56 y.o. male admitted with weakness-- unclear etiology but appears to be multifactorial and secondary to hypothyroidism, rhabdomyolysis, acute pulmonary vascular congestion. He was recently diagnosed with hypothyroidism (> 90) and started him on synthroid with increase in dose but has continued to decline in terms of overall clinical state despite treatment.  Clinical Impression  Pt with progressive weakness for the last month and noted bilat LE edema, bilat UE and noted joint swelling. Pt to be self limiting. Pt requiring modA for bed mobility but min guard for ambulation. Pt was indep and working full time PTA, up until 1 month ago. Acute PT to follow. Recommend CIR for maximal functional recovery.    Follow Up Recommendations CIR    Equipment Recommendations  Rolling walker with 5" wheels    Recommendations for Other Services Rehab consult     Precautions / Restrictions Precautions Precautions: Fall Restrictions Weight Bearing Restrictions: No      Mobility  Bed Mobility Overal bed mobility: Needs Assistance Bed Mobility: Rolling;Sidelying to Sit Rolling: Mod assist Sidelying to sit: Mod assist   Sit to supine: Mod assist   General bed mobility comments: max encouragement, v/c's for sequencing. pt encoaurged to try before asking spouse for help. modA for trunk elevation and minA for LE management off EOB  Transfers Overall transfer level: Needs assistance Equipment used: Rolling walker (2 wheeled) Transfers: Sit to/from Stand Sit to Stand: Min assist         General transfer comment: v/c's to push up from bed  Ambulation/Gait Ambulation/Gait assistance: Min A Ambulation Distance (Feet): 60 Feet Assistive device: Rolling walker (2 wheeled) Gait Pattern/deviations: Step-through pattern;Decreased  stride length Gait velocity: slow Gait velocity interpretation: Below normal speed for age/gender General Gait Details: decreased foot clearance, pt grimacing and appears in pain however pt reports "they're so heavy"  Stairs            Wheelchair Mobility    Modified Rankin (Stroke Patients Only)       Balance Overall balance assessment: Needs assistance Sitting-balance support: No upper extremity supported;Feet supported Sitting balance-Leahy Scale: Good Sitting balance - Comments: able to urinate in urinal at EOB without difficulty   Standing balance support: Single extremity supported;During functional activity Standing balance-Leahy Scale: Poor                               Pertinent Vitals/Pain Pain Assessment: No/denies pain (c/o it feels so heavy in regard to bilat thighs) Pain Score: 7  Pain Location: Bil hips>knees Pain Descriptors / Indicators: Constant (heavy, denies pain) Pain Intervention(s): Limited activity within patient's tolerance    Home Living Family/patient expects to be discharged to:: Private residence Living Arrangements: Spouse/significant other;Children Available Help at Discharge: Family;Available PRN/intermittently Type of Home: House Home Access: Level entry     Home Layout: Multi-level Home Equipment: None      Prior Function Level of Independence: Independent         Comments: Works full time     Higher education careers adviserHand Dominance   Dominant Hand: Right    Extremity/Trunk Assessment   Upper Extremity Assessment Upper Extremity Assessment: Defer to OT evaluation    Lower Extremity Assessment Lower Extremity Assessment: Generalized weakness (noted edema, impaired coordination)    Cervical / Trunk Assessment Cervical / Trunk Assessment:  Normal  Communication   Communication: No difficulties  Cognition Arousal/Alertness: Awake/alert Behavior During Therapy: WFL for tasks assessed/performed Overall Cognitive Status:  Within Functional Limits for tasks assessed                 General Comments: pt does appear to be self limiting and constantly asking for wife's assistance    General Comments General comments (skin integrity, edema, etc.): bilat LE and UEs with edema    Exercises General Exercises - Lower Extremity Ankle Circles/Pumps: Right;Left;Both;10 reps Long Arc Quad: AAROM;Both;10 reps;Seated   Assessment/Plan    PT Assessment Patient needs continued PT services  PT Problem List Decreased strength;Decreased range of motion;Decreased activity tolerance;Decreased balance;Decreased mobility;Decreased coordination;Decreased knowledge of use of DME;Decreased safety awareness;Decreased knowledge of precautions;Pain       PT Treatment Interventions DME instruction;Gait training;Stair training;Functional mobility training;Therapeutic activities;Therapeutic exercise;Balance training    PT Goals (Current goals can be found in the Care Plan section)  Acute Rehab PT Goals Patient Stated Goal: to figure out what is wrong PT Goal Formulation: With patient Time For Goal Achievement: 06/08/16 Potential to Achieve Goals: Good    Frequency Min 3X/week   Barriers to discharge Decreased caregiver support family works during the day    Co-evaluation               End of Session Equipment Utilized During Treatment: Gait belt Activity Tolerance: Patient limited by pain Patient left: in chair;with call bell/phone within reach;with family/visitor present (MD) Nurse Communication: Mobility status PT Visit Diagnosis: Difficulty in walking, not elsewhere classified (R26.2)         Time: 1914-7829 PT Time Calculation (min) (ACUTE ONLY): 23 min   Charges:   PT Evaluation $PT Eval Moderate Complexity: 1 Procedure PT Treatments $Gait Training: 8-22 mins   PT G CodesRozell Searing Edahi Kroening 06/01/2016, 2:22 PM   Lewis Shock, PT, DPT Pager #: [phone] Office #:  [phone]

## 2016-06-01 NOTE — Evaluation (Signed)
Occupational Therapy Evaluation Patient Details Name: Connor Vargas MRN: [id number] DOB: Jan 25, 1961 Today's Date: 06/01/2016    History of Present Illness  Connor Vargas is a 56 y.o. male admitted with weakness-- unclear etiology but appears to be multifactorial and secondary to hypothyroidism, rhabdomyolysis, acute pulmonary vascular congestion. He was recently diagnosed with hypothyroidism (> 90) and started him on synthroid with increase in dose but has continued to decline in terms of overall clinical state despite treatment.   Clinical Impression   This 56 yo male admitted with above presents to acute OT with deficits below (see OT problem list) thus affecting his PLOF of being totally independent with basic ADLs, IADLs, and working full time. He will benefit from acute OT with follow up OT on CIR to get back to PLOF.    Follow Up Recommendations  CIR;Supervision/Assistance - 24 hour    Equipment Recommendations  3 in 1 bedside commode;Tub/shower seat    Recommendations for Other Services Rehab consult     Precautions / Restrictions Precautions Precautions: Fall Restrictions Weight Bearing Restrictions: No      Mobility Bed Mobility Overal bed mobility: Needs Assistance Bed Mobility: Sit to Supine       Sit to supine: Mod assist   General bed mobility comments: A for legs only and pt did use rail  Transfers Overall transfer level: Needs assistance Equipment used: Rolling walker (2 wheeled) Transfers: Sit to/from Stand Sit to Stand: Min assist         General transfer comment: from recliner    Balance Overall balance assessment: Needs assistance Sitting-balance support: No upper extremity supported;Feet supported Sitting balance-Leahy Scale: Good     Standing balance support: Single extremity supported;During functional activity Standing balance-Leahy Scale: Poor                              ADL Overall ADL's : Needs  assistance/impaired Eating/Feeding: Set up;Supervision/ safety;Sitting Eating/Feeding Details (indicate cue type and reason): increased time Grooming: Supervision/safety;Set up;Sitting Grooming Details (indicate cue type and reason): increased time Upper Body Bathing: Set up;Supervision/ safety;Sitting Upper Body Bathing Details (indicate cue type and reason): increased time Lower Body Bathing: Moderate assistance Lower Body Bathing Details (indicate cue type and reason): min A sit<>stand; increased time Upper Body Dressing : Minimal assistance;Sitting Upper Body Dressing Details (indicate cue type and reason): increased time Lower Body Dressing: Maximal assistance Lower Body Dressing Details (indicate cue type and reason): min A sit<>stand; increased time Toilet Transfer: Minimal assistance;Ambulation;RW Toilet Transfer Details (indicate cue type and reason): recliner>turn to bed Toileting- Clothing Manipulation and Hygiene: Moderate assistance Toileting - Clothing Manipulation Details (indicate cue type and reason): min A sit<>stand             Vision Patient Visual Report: No change from baseline              Pertinent Vitals/Pain Pain Assessment: 0-10 Pain Score: 7  Pain Location: Bil hips>knees Pain Descriptors / Indicators: Constant (but other than this he cannot describe it) Pain Intervention(s): Limited activity within patient's tolerance;Repositioned (asked pt about pain meds, but he says he wants to wait to see if now that he is in bed if the pain will subside. reports he does not like to take pain meds due to nausea--made him aware that there are meds to help with nausea.)     Hand Dominance Right   Extremity/Trunk Assessment Upper Extremity Assessment Upper Extremity Assessment: Generalized weakness (all  movements are slow and increased effort v. normal)           Communication Communication Communication: No difficulties (but does speak [place] as well)    Cognition Arousal/Alertness: Awake/alert Behavior During Therapy: WFL for tasks assessed/performed Overall Cognitive Status: Within Functional Limits for tasks assessed                                Home Living Family/patient expects to be discharged to:: Private residence Living Arrangements: Spouse/significant other;Children Available Help at Discharge: Family;Available PRN/intermittently                         Home Equipment: None          Prior Functioning/Environment Level of Independence: Independent        Comments: Works full time        OT Problem List: Decreased strength;Impaired balance (sitting and/or standing);Pain;Decreased knowledge of use of DME or AE      OT Treatment/Interventions: Self-care/ADL training;Therapeutic activities;Patient/family education;DME and/or AE instruction;Balance training    OT Goals(Current goals can be found in the care plan section) Acute Rehab OT Goals Patient Stated Goal: to figure out what is wrong OT Goal Formulation: With patient Time For Goal Achievement: 06/15/16 Potential to Achieve Goals: Good  OT Frequency: Min 3X/week              End of Session Equipment Utilized During Treatment: Gait belt;Rolling walker Nurse Communication:  (pt reports he is not wanting to take pain meds due to nausea)  Activity Tolerance: Patient limited by pain Patient left: in bed;with call bell/phone within reach;with chair alarm set;with family/visitor present  OT Visit Diagnosis: Unsteadiness on feet (R26.81);Muscle weakness (generalized) (M62.81);Pain Pain - Right/Left:  (both) Pain - part of body: Leg                ADL either performed or assessed with clinical judgement  Time: 1321-1335 OT Time Calculation (min): 14 min Charges:  OT General Charges $OT Visit: 1 Procedure OT Evaluation $OT Eval Moderate Complexity: 1 Procedure Ignacia PalmaCathy Lambros Cerro, OTR/L [phone] 06/01/2016

## 2016-06-02 DIAGNOSIS — I429 Cardiomyopathy, unspecified: Secondary | ICD-10-CM

## 2016-06-02 DIAGNOSIS — E871 Hypo-osmolality and hyponatremia: Secondary | ICD-10-CM

## 2016-06-02 DIAGNOSIS — I2510 Atherosclerotic heart disease of native coronary artery without angina pectoris: Secondary | ICD-10-CM

## 2016-06-02 DIAGNOSIS — R778 Other specified abnormalities of plasma proteins: Secondary | ICD-10-CM

## 2016-06-02 DIAGNOSIS — R7989 Other specified abnormal findings of blood chemistry: Secondary | ICD-10-CM

## 2016-06-02 DIAGNOSIS — I5021 Acute systolic (congestive) heart failure: Secondary | ICD-10-CM

## 2016-06-02 DIAGNOSIS — E039 Hypothyroidism, unspecified: Secondary | ICD-10-CM

## 2016-06-02 DIAGNOSIS — R740 Nonspecific elevation of levels of transaminase and lactic acid dehydrogenase [LDH]: Secondary | ICD-10-CM

## 2016-06-02 DIAGNOSIS — M6282 Rhabdomyolysis: Secondary | ICD-10-CM

## 2016-06-02 DIAGNOSIS — R748 Abnormal levels of other serum enzymes: Secondary | ICD-10-CM

## 2016-06-02 DIAGNOSIS — E8809 Other disorders of plasma-protein metabolism, not elsewhere classified: Secondary | ICD-10-CM

## 2016-06-02 LAB — BRAIN NATRIURETIC PEPTIDE: B Natriuretic Peptide: 1168 pg/mL — ABNORMAL HIGH (ref 0.0–100.0)

## 2016-06-02 LAB — CK TOTAL AND CKMB (NOT AT ARMC)
CK, MB: 176 ng/mL — ABNORMAL HIGH (ref 0.5–5.0)
RELATIVE INDEX: 3.7 — AB (ref 0.0–2.5)
Total CK: 4749 U/L — ABNORMAL HIGH (ref 49–397)

## 2016-06-02 LAB — ENA+DNA/DS+ANTICH+CENTRO+JO...
Anti JO-1: >8 AI — ABNORMAL HIGH (ref 0.0–0.9)
Centromere Ab Screen: <0.2 AI (ref 0.0–0.9)
Chromatin Ab SerPl-aCnc: <0.2 AI (ref 0.0–0.9)
ENA SM AB SER-ACNC: 0.3 AI (ref 0.0–0.9)
SSA (Ro) (ENA) Antibody, IgG: <0.2 AI (ref 0.0–0.9)
SSB (La) (ENA) Antibody, IgG: <0.2 AI (ref 0.0–0.9)
Scleroderma (Scl-70) (ENA) Antibody, IgG: <0.2 AI (ref 0.0–0.9)

## 2016-06-02 LAB — LIPID PANEL
CHOL/HDL RATIO: 5.9 ratio
CHOLESTEROL: 147 mg/dL (ref 0–200)
HDL: 25 mg/dL — ABNORMAL LOW (ref >40)
LDL Cholesterol: 90 mg/dL (ref 0–99)
Triglycerides: 160 mg/dL — ABNORMAL HIGH (ref <150)
VLDL: 32 mg/dL (ref 0–40)

## 2016-06-02 LAB — HEPATITIS PANEL, ACUTE
HEP B C IGM: NEGATIVE
HEP B S AG: NEGATIVE
Hep A IgM: NEGATIVE

## 2016-06-02 LAB — COMPREHENSIVE METABOLIC PANEL
ALT: 123 U/L — ABNORMAL HIGH (ref 17–63)
ANION GAP: 8 (ref 5–15)
AST: 202 U/L — AB (ref 15–41)
Albumin: 2.8 g/dL — ABNORMAL LOW (ref 3.5–5.0)
Alkaline Phosphatase: 52 U/L (ref 38–126)
BILIRUBIN TOTAL: 0.8 mg/dL (ref 0.3–1.2)
BUN: 15 mg/dL (ref 6–20)
CO2: 25 mmol/L (ref 22–32)
Calcium: 8.4 mg/dL — ABNORMAL LOW (ref 8.9–10.3)
Chloride: 96 mmol/L — ABNORMAL LOW (ref 101–111)
Creatinine, Ser: 0.77 mg/dL (ref 0.61–1.24)
GFR calc Af Amer: >60 mL/min (ref >60)
Glucose, Bld: 84 mg/dL (ref 65–99)
POTASSIUM: 4.1 mmol/L (ref 3.5–5.1)
Sodium: 129 mmol/L — ABNORMAL LOW (ref 135–145)
TOTAL PROTEIN: 5.4 g/dL — AB (ref 6.5–8.1)

## 2016-06-02 LAB — CK: Total CK: 5262 U/L — ABNORMAL HIGH (ref 49–397)

## 2016-06-02 LAB — ANA W/REFLEX IF POSITIVE: ANA: POSITIVE — AB

## 2016-06-02 LAB — SEDIMENTATION RATE: SED RATE: 12 mm/h (ref 0–16)

## 2016-06-02 MED ORDER — FUROSEMIDE 10 MG/ML IJ SOLN
40.0000 mg | Freq: Once | INTRAMUSCULAR | Status: DC
  Filled 2016-06-02 (×2): qty 4

## 2016-06-02 MED ORDER — LACTULOSE 10 GM/15ML PO SOLN
20.0000 g | Freq: Every day | ORAL | Status: DC | PRN
  Administered 2016-06-02: 20 g via ORAL
  Filled 2016-06-02: qty 30

## 2016-06-02 MED ORDER — FUROSEMIDE 20 MG PO TABS
20.0000 mg | ORAL_TABLET | Freq: Every day | ORAL | Status: DC
  Administered 2016-06-02 – 2016-06-07 (×6): 20 mg via ORAL
  Filled 2016-06-02 (×7): qty 1

## 2016-06-02 NOTE — Progress Notes (Signed)
Inpatient Rehabilitation  I met with the patient and his wife and sister at the bedside to discuss the recommendation for CIR.  I provided informational booklets and answered his initial questions.  We will follow along for medical readiness for initiating the insurance authorization process for a possible CIR admission. Please call if questions.  [facility] Admissions Coordinator Cell [phone] Office [phone]

## 2016-06-02 NOTE — Progress Notes (Signed)
PROGRESS NOTE    Connor Vargas  UJW:[id number] DOB: December 08, 1960 DOA: 05/31/2016 PCP: Kaleen Mask, MD   Brief Narrative:   Connor Vargas is a 56 y.o. male with recently diagnosed hypothyroidism (few months ago when TSH was reportedly > 90) and his PCP started him on synthroid and the dose increased from initially 50 mcg --> currently 175 mcg but pt continued to decline in terms of overall clinical state despite treatment. He was sent to [facility] hospital about two weeks prior to this admission and was discharged home with same diagnosis of hypothyroidism and ? Myxedema. His medications were not changed and pt was told to follow up with rheumatologist which pt is scheduled to see March 6th. Pt now comes here with progressively worsening weakness, LE and UE swelling, dyspnea exertional and at rest. 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 In addition, pt's says he has not eaten anything in the past two days.  Echo done in the hospital showed new onset sCHF ef 25%, cardiology consulted.    Assessment & Plan:   Active Problems:   CAP (community acquired pneumonia)   Weakness   External hemorrhoids   Pulmonary vascular congestion   Acquired hypothyroidism   Rhabdomyolysis   Transaminitis  Generalized B/L LE Weakness - unclear etiology but appears to be multifactorial and secondary to hypothyroidism, rhabdomyolysis, acute pulmonary vascular congestion - PT/OT- will need in patient rehab - for now treat acute issues  -am corstisol - ok -hep panel is neg -ANA is still pending.   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 No infiltrates noted on the  echo.  - will give another dose of lasix 40mg  iv today.  - monitor daily weights and strict I/O  -Aldactone started on 06/01/16 -cardiology following- plans for [facility] and [facility] once medically more optimized.  -monitor electrolytes    Hyponatremia - likely from fluid overload and hypothyroidism -will cont treating underlying issue of hypothyroid and chf.  -cont to monitor Na.      Acquired hypothyroidism - TSH is trending down based on record review  - continue synthroid     Rhabdomyolysis - stable - suspect from his thyroid condition at this time - has been elevated for several weeks now per record review, apparently lingering around 5000 - pt has an appointment with rheumatologist on March 6th, 2018 - no IVF due to his cardiac status.     Transaminitis - suspect this is related to the above rhabdo and hypothyroid - CMET in AM - hep panel ordered- neg - for now ANA is pending -I believe if this resolves with his rhado and hypothyroid then no further work up otherwise will need to evaluate for other autoimmune issues and RUQ [place].    External hemorrhoids - continue home medical regimen  B/L Moderate Pleural effusion -related to cardiac etiology.   DVT prophylaxis: Lovenox SQ Code Status: Full  Family Communication: Wife at bedside. Disposition Plan: TBD Consults called: None Admission status: Inpatient    Consultants:   Cardiology     Subjective: Patient continues to report of feeling LE heaviness but little improved from yesterday. No other  complaints otherwise.   Objective: Vitals:   06/01/16 1552 06/01/16 2023 06/02/16 0550 06/02/16 0816  BP: (!) 104/56 100/64 (!) 97/57 105/60  Pulse: 86 87 84 93  Resp:  20 18 18   Temp: 98.3 F (36.8 C) 98 F (36.7 C) 97.7 F (36.5 C) 97.7 F (36.5 C)  TempSrc: Oral Oral Oral Oral  SpO2:  96% 97% 100%  Weight:      Height:        Intake/Output Summary (Last 24 hours) at 06/02/16 1045 Last data filed at 06/02/16  1000  Gross per 24 hour  Intake                0 ml  Output             1650 ml  Net            -1650 ml   Filed Weights   05/31/16 1211 05/31/16 1628 06/01/16 0513  Weight: 82.1 kg (181 lb) 83.7 kg (184 lb 8 oz) 82.3 kg (181 lb 8 oz)    Examination:  General exam: Appears calm and comfortable  Respiratory system: decreased BS at b/l bases.  Cardiovascular system: S1 & S2 heard, RRR. No JVD, murmurs, rubs, gallops or clicks. No pedal edema. Gastrointestinal system: Abdomen is nondistended, soft and nontender. No organomegaly or masses felt. Normal bowel sounds heard. Central nervous system: Alert and oriented. No focal neurological deficits. Extremities: Symmetric 5 x 5 power. 2+ b/L swellig Skin: No rashes, lesions or ulcers Psychiatry: Judgement and insight appear normal. Mood & affect appropriate.     Data Reviewed:   CBC:  Recent Labs Lab 05/31/16 1219 06/01/16 0437  WBC 10.5 9.5  HGB 12.6* 11.1*  HCT 38.2* 33.4*  MCV 88.0 88.4  PLT 339 275   Basic Metabolic Panel:  Recent Labs Lab 05/31/16 1219 06/01/16 0437 06/02/16 0326  NA 125* 129* 129*  K 4.7 4.0 4.1  CL 95* 96* 96*  CO2 21* 24 25  GLUCOSE 94 84 84  BUN 13 14 15   CREATININE 0.64 0.70 0.77  CALCIUM 8.5* 8.0* 8.4*   GFR: Estimated Creatinine Clearance: 115.1 mL/min (by C-G formula based on SCr of 0.77 mg/dL). Liver Function Tests:  Recent Labs Lab 05/31/16 1219 06/01/16 0437 06/02/16 0326  AST 222* 187* 202*  ALT 133* 112* 123*  ALKPHOS 60 47 52  BILITOT 0.9 0.8 0.8  PROT 5.9* 4.9* 5.4*  ALBUMIN 2.9* 2.6* 2.8*   No results for input(s): LIPASE, AMYLASE in the last 168 hours. No results for input(s): AMMONIA in the last 168 hours. Coagulation Profile: No results for input(s): INR, PROTIME in the last 168 hours. Cardiac Enzymes:  Recent Labs Lab 05/31/16 1309 05/31/16 1715 05/31/16 2212 06/01/16 0437 06/02/16 0326  CKTOTAL 5,482*  --   --  4,440* 5,262*  TROPONINI 0.59* 0.53*  0.80* 0.61*  --    BNP (last 3 results) No results for input(s): PROBNP in the last 8760 hours. HbA1C: No results for input(s): HGBA1C in the last 72 hours. CBG: No results for input(s): GLUCAP in the last 168 hours. Lipid Profile:  Recent Labs  06/02/16 0326  CHOL 147  HDL 25*  LDLCALC 90  TRIG 409160*  CHOLHDL 5.9   Thyroid Function Tests:  Recent Labs  05/31/16 1309  TSH 15.602*   Anemia Panel: No results for input(s): VITAMINB12, FOLATE, FERRITIN, TIBC, IRON, RETICCTPCT in the last 72 hours. Sepsis Labs: No results for input(s): PROCALCITON, LATICACIDVEN in the last  168 hours.  No results found for this or any previous visit (from the past 240 hour(s)).       Radiology Studies: Dg Chest 2 View  Result Date: 05/31/2016 CLINICAL DATA:  Generalized weakness.  Shortness of breath. EXAM: CHEST  2 VIEW COMPARISON:  One-view chest x-ray 04/13/2015 FINDINGS: The heart is enlarged. Atherosclerotic calcifications are present at the aortic arch. Diffuse interstitial and airspace disease is present. Bilateral effusions are present, left greater than right. Left greater than right basilar airspace disease is present. IMPRESSION: 1. Cardiomegaly with interstitial and airspace disease likely reflecting edema and bilateral effusions suggesting congestive heart failure. 2. Left greater than right pleural effusions and airspace disease. While this likely reflects atelectasis, infection is also considered. Electronically Signed   By: Marin Roberts M.D.   On: 05/31/2016 14:38   Ct Chest Wo Contrast  Result Date: 05/31/2016 CLINICAL DATA:  Dyspnea EXAM: CT CHEST WITHOUT CONTRAST TECHNIQUE: Multidetector CT imaging of the chest was performed following the standard protocol without IV contrast. COMPARISON:  Chest x-ray 05/31/2016 FINDINGS: Cardiovascular: Limited vascular evaluation without intravenous contrast. Negative for aortic aneurysm. Mild atherosclerotic disease aortic arch.  Extensive calcification in the left main and left anterior descending coronary artery and in the circumflex. Right coronary calcification. Heart size normal. Small pericardial effusion. Mediastinum/Nodes: No mass or adenopathy Lungs/Pleura: Moderate pleural effusion bilaterally. Bibasilar airspace disease may represent pneumonia or atelectasis. There is dense material in the lower lobe airways bilaterally which is likely aspirated material. No high-density material in the stomach. Upper lobes relatively clear. Upper Abdomen: Negative Musculoskeletal: Negative IMPRESSION: Moderately large bilateral pleural effusions. Small pericardial effusion Extensive coronary artery disease with heavy calcification left main and left coronary artery. Right coronary artery also calcified Bibasilar airspace disease which may be atelectasis or infiltrate. There is high-density material in the lower lobe airway bilaterally which is likely aspirated material, possibly barium or other ingested material. No high-density material is seen in the stomach at this time. Electronically Signed   By: Marlan Palau M.D.   On: 05/31/2016 21:21        Scheduled Meds: . aspirin EC  81 mg Oral Daily  . carvedilol  3.125 mg Oral BID WC  . enoxaparin (LOVENOX) injection  40 mg Subcutaneous Q24H  . hydrocortisone  25 mg Rectal BID  . hydrocortisone-pramoxine  1 applicator Rectal Once  . levothyroxine  175 mcg Oral QAC breakfast  . sodium chloride flush  3 mL Intravenous Q12H  . spironolactone  12.5 mg Oral BID   Continuous Infusions:   LOS: 2 days    Time spent: 35 mins     Antrell Tipler Joline Maxcy, MD [facility] Pager [phone]   If 7PM-7AM, please contact night-coverage [email or web address] Password [email or web address] 06/02/2016, 10:45 AM

## 2016-06-02 NOTE — Progress Notes (Addendum)
Progress Note  Patient Name: Ida Tatar Date of Encounter: 06/02/2016  Primary Cardiologist: New- Dr. Claiborne Billings  Subjective   Pt is feeling fatigued and complaining of abdominal pain. He was able to eat this morning for the first time in several days and now has abdominal pain. He is constipated, but does not want to take a laxative as it will cause pain with his hemorrhoids.   Inpatient Medications    Scheduled Meds: . aspirin EC  81 mg Oral Daily  . carvedilol  3.125 mg Oral BID WC  . enoxaparin (LOVENOX) injection  40 mg Subcutaneous Q24H  . hydrocortisone  25 mg Rectal BID  . hydrocortisone-pramoxine  1 applicator Rectal Once  . levothyroxine  175 mcg Oral QAC breakfast  . sodium chloride flush  3 mL Intravenous Q12H  . spironolactone  12.5 mg Oral BID   Continuous Infusions:  PRN Meds: ondansetron **OR** ondansetron (ZOFRAN) IV, oxyCODONE, traZODone   Vital Signs    Vitals:   06/01/16 0513 06/01/16 1552 06/01/16 2023 06/02/16 0550  BP: 114/69 (!) 104/56 100/64 (!) 97/57  Pulse: 97 86 87 84  Resp: 20  20 18   Temp: 98.4 F (36.9 C) 98.3 F (36.8 C) 98 F (36.7 C) 97.7 F (36.5 C)  TempSrc: Oral Oral Oral Oral  SpO2: 97%  96% 97%  Weight: 181 lb 8 oz (82.3 kg)     Height:        Intake/Output Summary (Last 24 hours) at 06/02/16 0748 Last data filed at 06/01/16 1800  Gross per 24 hour  Intake              240 ml  Output             1950 ml  Net            -1710 ml   Filed Weights   05/31/16 1211 05/31/16 1628 06/01/16 0513  Weight: 181 lb (82.1 kg) 184 lb 8 oz (83.7 kg) 181 lb 8 oz (82.3 kg)    Telemetry    Sinus rhythm with Occ PVC's in the 80's and 90's - Personally Reviewed  ECG    Sinus rhythm at 110 bpm, Q waves unchanged from previous - Personally Reviewed  Physical Exam   GEN: No acute distress.   Neck: + JVD Cardiac: RRR, no murmurs, rubs, or gallops.  Respiratory: Few fine crackles in the lung bases GI: Soft, nontender, non-distended   MS: 1+ lower leg pitting edema; Non- pitting upper extremity edema. No deformity. Neuro:  Nonfocal  Psych: Normal affect   Labs    Chemistry Recent Labs Lab 05/31/16 1219 06/01/16 0437 06/02/16 0326  NA 125* 129* 129*  K 4.7 4.0 4.1  CL 95* 96* 96*  CO2 21* 24 25  GLUCOSE 94 84 84  BUN 13 14 15   CREATININE 0.64 0.70 0.77  CALCIUM 8.5* 8.0* 8.4*  PROT 5.9* 4.9* 5.4*  ALBUMIN 2.9* 2.6* 2.8*  AST 222* 187* 202*  ALT 133* 112* 123*  ALKPHOS 60 47 52  BILITOT 0.9 0.8 0.8  GFRNONAA >60 >60 >60  GFRAA >60 >60 >60  ANIONGAP 9 9 8      Hematology Recent Labs Lab 05/31/16 1219 06/01/16 0437  WBC 10.5 9.5  RBC 4.34 3.78*  HGB 12.6* 11.1*  HCT 38.2* 33.4*  MCV 88.0 88.4  MCH 29.0 29.4  MCHC 33.0 33.2  RDW 14.5 14.8  PLT 339 275    Cardiac Enzymes Recent Labs Lab 05/31/16 1309  05/31/16 1715 05/31/16 2212 06/01/16 0437  TROPONINI 0.59* 0.53* 0.80* 0.61*   No results for input(s): TROPIPOC in the last 168 hours.   BNP Recent Labs Lab 05/31/16 1219  BNP 1,328.2*    Lab Results  Component Value Date   TSH 15.602 (H) 05/31/2016   Lipid Panel     Component Value Date/Time   CHOL 147 06/02/2016 0326   TRIG 160 (H) 06/02/2016 0326   HDL 25 (L) 06/02/2016 0326   CHOLHDL 5.9 06/02/2016 0326   VLDL 32 06/02/2016 0326   LDLCALC 90 06/02/2016 0326   DDimer No results for input(s): DDIMER in the last 168 hours.   Radiology    Dg Chest 2 View  Result Date: 05/31/2016 CLINICAL DATA:  Generalized weakness.  Shortness of breath. EXAM: CHEST  2 VIEW COMPARISON:  One-view chest x-ray 04/13/2015 FINDINGS: The heart is enlarged. Atherosclerotic calcifications are present at the aortic arch. Diffuse interstitial and airspace disease is present. Bilateral effusions are present, left greater than right. Left greater than right basilar airspace disease is present. IMPRESSION: 1. Cardiomegaly with interstitial and airspace disease likely reflecting edema and bilateral  effusions suggesting congestive heart failure. 2. Left greater than right pleural effusions and airspace disease. While this likely reflects atelectasis, infection is also considered. Electronically Signed   By: San Morelle M.D.   On: 05/31/2016 14:38   Ct Chest Wo Contrast  Result Date: 05/31/2016 CLINICAL DATA:  Dyspnea EXAM: CT CHEST WITHOUT CONTRAST TECHNIQUE: Multidetector CT imaging of the chest was performed following the standard protocol without IV contrast. COMPARISON:  Chest x-ray 05/31/2016 FINDINGS: Cardiovascular: Limited vascular evaluation without intravenous contrast. Negative for aortic aneurysm. Mild atherosclerotic disease aortic arch. Extensive calcification in the left main and left anterior descending coronary artery and in the circumflex. Right coronary calcification. Heart size normal. Small pericardial effusion. Mediastinum/Nodes: No mass or adenopathy Lungs/Pleura: Moderate pleural effusion bilaterally. Bibasilar airspace disease may represent pneumonia or atelectasis. There is dense material in the lower lobe airways bilaterally which is likely aspirated material. No high-density material in the stomach. Upper lobes relatively clear. Upper Abdomen: Negative Musculoskeletal: Negative IMPRESSION: Moderately large bilateral pleural effusions. Small pericardial effusion Extensive coronary artery disease with heavy calcification left main and left coronary artery. Right coronary artery also calcified Bibasilar airspace disease which may be atelectasis or infiltrate. There is high-density material in the lower lobe airway bilaterally which is likely aspirated material, possibly barium or other ingested material. No high-density material is seen in the stomach at this time. Electronically Signed   By: Franchot Gallo M.D.   On: 05/31/2016 21:21    Cardiac Studies   Echo 06/01/2016:  Study Conclusions - Left ventricle: Septal apical inferior and distal anterior  wall hypokinesis The cavity size was severely dilated. Wall thickness was normal. Systolic function was severely reduced. The estimated ejection fraction was in the range of 25% to 30%. Left ventricular diastolic function parameters were normal. - Aortic valve: There was mild regurgitation. - Mitral valve: There was mild regurgitation. - Left atrium: The appendage was moderately to severely dilated. - Atrial septum: No defect or patent foramen ovale was identified. - Pericardium, extracardiac: Small to moderate posterior lateral pericardial effusion no tamponade Moderate left pleural effusion.   Patient Profile     56 y.o. male with past medical history of Bell's Palsy in 24. He was recently diagnosed with hypothyroid a few months ago with TSH reportedly >90 and his PCP started him on synthroid. He has elevated  CK's and transaminase. We have been asked to consult for newl LV dysfunction with EF 25-30%.   Assessment & Plan    Acute systolic and diastolic dysfunction -Pt with new hypothyroidism. Mild orthopnea. Peripheral edema. -He has a very strong family history for premature CAD with his mother suffering multiple myocardial infarctions and dying at age 59. -EF 25-30%, down from 50-55% on 05/22/2016 -BNP 1328.2. Troponins 0.59, 0.53, 0.80, 0.61 -CXR: Moderately large bilateral pleural effusions. Small pericardial effusion Extensive coronary artery disease with heavy calcification left main and left coronary artery. Right coronary artery also calcified Bibasilar airspace disease which may be atelectasis or infiltrate. -The patient will need right and left cardiac catheterization for further evaluation of new LV dysfunction to assess for ischemic cause of dysfunction. May need a biopsy to investigate infiltrative process. Will defer to cardiologist. SCr 0.70. -Still has edema and few crackles in lung bases.  -Received lasix 40 mg IV daily with improvement in breathing.  Spironolactone was added yesterday and BP is soft and pt has mild lightheadedness when rising up. Will hold off on further lasix at present in setting of hypotension and good UOP with spironolactone. K+ 4.1. -I&O net negative 1710 ml yesterday with spironolactone, net negative 3 L since admission.  -May consider adding ACE-I, low dose as BP allows. Continue aspirin.  -Carvedilol started this morning, Will monitor BP.  Rhabdomyolysis -CPK 5,482--> 4,440. Has been elevated for several weeks. Back up to 5,262 today -Profound muscle weakness and pain -Was given IV fluids until found to have severe LV dysfunction and IV fluids stopped.  Hypothyroidism -TSH was >90 when diagnosed  A month or so ago. Started on synthroid. Was 36.989 on 05/12/2016 -05/31/2016 TSH 15.602 -Managed by IM. Levothyroxine 175 mcg daily  Constipation -with hemorrhoids- will add lactulose  Signed, Daune Perch, NP  06/02/2016, 7:48 AM     Patient seen and examined. Agree with assessment and plan. Weak, no chest pain. I/O -1710 yesterday; net -3585 since admission. Sinus rhythm in the 90s. BP 105/60. TSH 15, improved from >90.  CPK 5262 today,  Will check MB. Check aldolase, ESR. Also BNP.   Significant transaminitis with AST 202 and ALT 123. Now on carvedilol with plans to titrate as BP and HR allow.  Lasix 40 mg iv given yesterday; will change to 20 mg orally.  Discussed his future need to undergo right and left heart cardiac catheterization.  Significant coronary calcification noted on CT imaging.   Troy Sine, MD, [facility] 06/02/2016 11:53 AM

## 2016-06-03 ENCOUNTER — Inpatient Hospital Stay (HOSPITAL_COMMUNITY): Payer: BLUE CROSS/BLUE SHIELD

## 2016-06-03 LAB — HEMOGLOBIN A1C
Hgb A1c MFr Bld: 5 % (ref 4.8–5.6)
Mean Plasma Glucose: 97 mg/dL

## 2016-06-03 LAB — CBC
HEMATOCRIT: 33.4 % — AB (ref 39.0–52.0)
Hemoglobin: 11.1 g/dL — ABNORMAL LOW (ref 13.0–17.0)
MCH: 29.3 pg (ref 26.0–34.0)
MCHC: 33.2 g/dL (ref 30.0–36.0)
MCV: 88.1 fL (ref 78.0–100.0)
PLATELETS: 292 10*3/uL (ref 150–400)
RBC: 3.79 MIL/uL — AB (ref 4.22–5.81)
RDW: 14.7 % (ref 11.5–15.5)
WBC: 10.5 10*3/uL (ref 4.0–10.5)

## 2016-06-03 LAB — BASIC METABOLIC PANEL
Anion gap: 9 (ref 5–15)
BUN: 15 mg/dL (ref 6–20)
CHLORIDE: 95 mmol/L — AB (ref 101–111)
CO2: 25 mmol/L (ref 22–32)
Calcium: 8.3 mg/dL — ABNORMAL LOW (ref 8.9–10.3)
Creatinine, Ser: 0.66 mg/dL (ref 0.61–1.24)
Glucose, Bld: 88 mg/dL (ref 65–99)
POTASSIUM: 4.2 mmol/L (ref 3.5–5.1)
Sodium: 129 mmol/L — ABNORMAL LOW (ref 135–145)

## 2016-06-03 LAB — MAGNESIUM: Magnesium: 2 mg/dL (ref 1.7–2.4)

## 2016-06-03 LAB — CK: CK TOTAL: 5560 U/L — AB (ref 49–397)

## 2016-06-03 MED ORDER — LIDOCAINE 5 % EX OINT
TOPICAL_OINTMENT | Freq: Three times a day (TID) | CUTANEOUS | Status: DC | PRN
Start: 2016-06-03 — End: 2016-06-10
  Administered 2016-06-03: 20:00:00 via TOPICAL
  Filled 2016-06-03: qty 35.44

## 2016-06-03 MED ORDER — CARVEDILOL 6.25 MG PO TABS
6.2500 mg | ORAL_TABLET | Freq: Two times a day (BID) | ORAL | Status: DC
  Administered 2016-06-03 – 2016-06-04 (×2): 6.25 mg via ORAL
  Filled 2016-06-03 (×3): qty 1

## 2016-06-03 MED ORDER — WITCH HAZEL-GLYCERIN EX PADS
MEDICATED_PAD | Freq: Three times a day (TID) | CUTANEOUS | Status: DC
  Administered 2016-06-03 – 2016-06-08 (×8): via TOPICAL
  Filled 2016-06-03 (×2): qty 100

## 2016-06-03 MED ORDER — LOSARTAN POTASSIUM 25 MG PO TABS
25.0000 mg | ORAL_TABLET | Freq: Every day | ORAL | Status: DC
  Administered 2016-06-03 – 2016-06-04 (×2): 25 mg via ORAL
  Filled 2016-06-03 (×2): qty 1

## 2016-06-03 NOTE — Progress Notes (Signed)
Pt continues to refuse hydrocortisone suppository. Re-educated patient and present family members on the purpose and effectiveness of the suppository. Pt states he might be willing to try tonight. Pt given ice pack and tuck's pads per orders. Will continue to offer supportive therapies.   Leonidas Rombergaitlin S Bumbledare, RN

## 2016-06-03 NOTE — Consult Note (Signed)
[facility] Consult Note  Connor Vargas 1961-03-03  [id number].    Requesting MD: Doyle Askew Chief Complaint/Reason for Consult: Hemorrhoids  HPI:  Connor Vargas is a 56yo male admitted [facility] 05/31/16 with acute systolic and diastolic dysfunction (EF 26%), rhabdomyolysis, and CAD (planning for right and left heart cath once fluid status improves). General surgery consulted for evaluation of painful hemorrhoids. States that he has had hemorrhoids for 4-5 years. He uses OTC hydrocortisone creams intermittently which help. States that his pain has been worse x1 month after being constipated for 4-5 days. He noticed some bright red blood on tissue after wiping at the time, but no more since then. Pain is worse when he has a bowel movement. Currently the steroid suppositories and cream are not helping.  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  HENT: Negative.   Eyes: Negative.   Respiratory: Negative.   Cardiovascular: Positive for leg swelling.  Gastrointestinal:       Hemorrhoids  Genitourinary: Negative.   Musculoskeletal: Negative.   Skin: Negative.   All systems reviewed and otherwise negative except for as above  Family History  Problem Relation Age of Onset  . Coronary artery disease Mother 46    Past Medical History:  Diagnosis Date  . Bell's palsy    1980  . Thyroid disease     History reviewed. No pertinent surgical history.  Social History:  reports that he has never smoked. He has never used smokeless tobacco. He reports that he drinks alcohol. He reports that he does not use drugs.  Allergies: No Known Allergies  Medications Prior to Admission  Medication Sig Dispense Refill  . aspirin EC 81 MG tablet Take 81 mg by mouth daily.    Marland Kitchen docusate sodium  (COLACE) 100 MG capsule Take 1 capsule (100 mg total) by mouth 2 (two) times daily. 10 capsule 0  . levothyroxine (SYNTHROID, LEVOTHROID) 175 MCG tablet Take 175 mcg by mouth daily before breakfast.    . senna (SENOKOT) 8.6 MG tablet Take 1 tablet by mouth daily.    . traMADol (ULTRAM) 50 MG tablet Take 50 mg by mouth every 6 (six) hours as needed for moderate pain.    . hydrocortisone (ANUSOL-HC) 25 MG suppository Place 1 suppository (25 mg total) rectally 2 (two) times daily. (Patient not taking: Reported on 05/31/2016) 12 suppository 0  . levofloxacin (LEVAQUIN) 750 MG tablet Take 1 tablet (750 mg total) by mouth daily. (Patient not taking: Reported on 05/31/2016) 4 tablet 0    Prior to Admission medications   Medication Sig Start Date End Date Taking? Authorizing Provider  aspirin EC 81 MG tablet Take 81 mg by mouth daily.   Yes Historical Provider, MD  docusate sodium (COLACE) 100 MG capsule Take 1 capsule (100 mg total) by mouth 2 (two) times daily. 04/15/15  Yes Delfina Redwood, MD  levothyroxine (SYNTHROID, LEVOTHROID) 175 MCG tablet Take 175 mcg by mouth daily before breakfast.   Yes Historical Provider, MD  senna (SENOKOT) 8.6 MG tablet Take 1 tablet by mouth daily.   Yes Historical Provider, MD  traMADol (ULTRAM) 50 MG tablet Take 50 mg by mouth every 6 (six) hours as needed for moderate pain.   Yes Historical Provider, MD  hydrocortisone (ANUSOL-HC) 25 MG suppository Place 1 suppository (25 mg total) rectally 2 (two) times  daily. Patient not taking: Reported on 05/31/2016 04/15/15   Delfina Redwood, MD  levofloxacin (LEVAQUIN) 750 MG tablet Take 1 tablet (750 mg total) by mouth daily. Patient not taking: Reported on 05/31/2016 04/15/15   Delfina Redwood, MD    Blood pressure (!) 105/58, pulse 83, temperature 97.8 F (36.6 C), temperature source Oral, resp. rate 20, height 6' 1" (1.854 m), weight 181 lb 8 oz (82.3 kg), SpO2 99 %. Physical Exam: General: pleasant, WD/WN white male  who is pacing in his room in NAD HEENT: head is normocephalic, atraumatic.  Sclera are noninjected.  Mouth is pink and moist Heart: regular, rate, and rhythm.  No obvious murmurs, gallops, or rubs noted.  Palpable pedal pulses bilaterally Lungs: diminished breaths sound at lung bases, CTAB, no wheezes, rhonchi, or rales noted.  Respiratory effort nonlabored Abd: soft, NT/ND, +BS, no masses, hernias, or organomegaly MS: all 4 extremities are symmetrical with no cyanosis or clubbing. 2+ pitting edema BLE Skin: warm and dry with no masses, lesions, or rashes Psych: A&Ox3 with an appropriate affect. Neuro: normal speech GU: single tender thrombosed external hemorrhoid, no active bleeding  Results for orders placed or performed during the hospital encounter of 05/31/16 (from the past 48 hour(s))  CK     Status: Abnormal   Collection Time: 06/02/16  3:26 AM  Result Value Ref Range   Total CK 5,262 (H) 49 - 397 U/L    Comment: RESULTS CONFIRMED BY MANUAL DILUTION  Hemoglobin A1c     Status: None   Collection Time: 06/02/16  3:26 AM  Result Value Ref Range   Hgb A1c MFr Bld 5.0 4.8 - 5.6 %    Comment: (NOTE)         Pre-diabetes: 5.7 - 6.4         Diabetes: >6.4         Glycemic control for adults with diabetes: <7.0    Mean Plasma Glucose 97 mg/dL    Comment: (NOTE) Performed At: [facility] [place], [place] [id number] Lindon Romp MD [id number]   Comprehensive metabolic panel     Status: Abnormal   Collection Time: 06/02/16  3:26 AM  Result Value Ref Range   Sodium 129 (L) 135 - 145 mmol/L   Potassium 4.1 3.5 - 5.1 mmol/L   Chloride 96 (L) 101 - 111 mmol/L   CO2 25 22 - 32 mmol/L   Glucose, Bld 84 65 - 99 mg/dL   BUN 15 6 - 20 mg/dL   Creatinine, Ser 0.77 0.61 - 1.24 mg/dL   Calcium 8.4 (L) 8.9 - 10.3 mg/dL   Total Protein 5.4 (L) 6.5 - 8.1 g/dL   Albumin 2.8 (L) 3.5 - 5.0 g/dL   AST 202 (H) 15 - 41 U/L   ALT 123 (H) 17 - 63 U/L   Alkaline  Phosphatase 52 38 - 126 U/L   Total Bilirubin 0.8 0.3 - 1.2 mg/dL   GFR calc non Af Amer >60 >60 mL/min   GFR calc Af Amer >60 >60 mL/min    Comment: (NOTE) The eGFR has been calculated using the CKD EPI equation. This calculation has not been validated in all clinical situations. eGFR's persistently <60 mL/min signify possible Chronic Kidney Disease.    Anion gap 8 5 - 15  Lipid panel     Status: Abnormal   Collection Time: 06/02/16  3:26 AM  Result Value Ref Range   Cholesterol 147 0 - 200 mg/dL  Triglycerides 160 (H) <150 mg/dL   HDL 25 (L) >40 mg/dL   Total CHOL/HDL Ratio 5.9 RATIO   VLDL 32 0 - 40 mg/dL   LDL Cholesterol 90 0 - 99 mg/dL    Comment:        Total Cholesterol/HDL:CHD Risk Coronary Heart Disease Risk Table                     Men   Women  1/2 Average Risk   3.4   3.3  Average Risk       5.0   4.4  2 X Average Risk   9.6   7.1  3 X Average Risk  23.4   11.0        Use the calculated Patient Ratio above and the CHD Risk Table to determine the patient's CHD Risk.        ATP III CLASSIFICATION (LDL):  <100     mg/dL   Optimal  100-129  mg/dL   Near or Above                    Optimal  130-159  mg/dL   Borderline  160-189  mg/dL   High  >190     mg/dL   Very High   Sedimentation rate     Status: None   Collection Time: 06/02/16 12:47 PM  Result Value Ref Range   Sed Rate 12 0 - 16 mm/hr  Brain natriuretic peptide     Status: Abnormal   Collection Time: 06/02/16 12:47 PM  Result Value Ref Range   B Natriuretic Peptide 1,168.0 (H) 0.0 - 100.0 pg/mL  CK total and CKMB (cardiac)not at [facility]     Status: Abnormal   Collection Time: 06/02/16 12:47 PM  Result Value Ref Range   Total CK 4,749 (H) 49 - 397 U/L    Comment: RESULTS CONFIRMED BY MANUAL DILUTION   CK, MB 176.0 (H) 0.5 - 5.0 ng/mL   Relative Index 3.7 (H) 0.0 - 2.5  CK     Status: Abnormal   Collection Time: 06/03/16  2:42 AM  Result Value Ref Range   Total CK 5,560 (H) 49 - 397 U/L     Comment: RESULTS CONFIRMED BY MANUAL DILUTION  Basic metabolic panel     Status: Abnormal   Collection Time: 06/03/16  2:42 AM  Result Value Ref Range   Sodium 129 (L) 135 - 145 mmol/L   Potassium 4.2 3.5 - 5.1 mmol/L   Chloride 95 (L) 101 - 111 mmol/L   CO2 25 22 - 32 mmol/L   Glucose, Bld 88 65 - 99 mg/dL   BUN 15 6 - 20 mg/dL   Creatinine, Ser 0.66 0.61 - 1.24 mg/dL   Calcium 8.3 (L) 8.9 - 10.3 mg/dL   GFR calc non Af Amer >60 >60 mL/min   GFR calc Af Amer >60 >60 mL/min    Comment: (NOTE) The eGFR has been calculated using the CKD EPI equation. This calculation has not been validated in all clinical situations. eGFR's persistently <60 mL/min signify possible Chronic Kidney Disease.    Anion gap 9 5 - 15  CBC     Status: Abnormal   Collection Time: 06/03/16  2:42 AM  Result Value Ref Range   WBC 10.5 4.0 - 10.5 K/uL   RBC 3.79 (L) 4.22 - 5.81 MIL/uL   Hemoglobin 11.1 (L) 13.0 - 17.0 g/dL   HCT 33.4 (L) 39.0 - 52.0 %  MCV 88.1 78.0 - 100.0 fL   MCH 29.3 26.0 - 34.0 pg   MCHC 33.2 30.0 - 36.0 g/dL   RDW 14.7 11.5 - 15.5 %   Platelets 292 150 - 400 K/uL  Magnesium     Status: None   Collection Time: 06/03/16  2:42 AM  Result Value Ref Range   Magnesium 2.0 1.7 - 2.4 mg/dL   No results found.    Assessment/Plan Thrombosed external hemorrhoid - patient reports 4-5 years history of intermittently painful hemorrhoids; he uses OTC creams which typically help - he has had increased pain x1 month after an episode of constipation  Hypothyroidism Acute systolic and diastolic dysfunction (EF 78%) Rhabdomyolysis CAD (planning for right and left heart cath once fluid status improves).  Plan - Continue hydrocortisone suppositories and proctofoam. Will add sitz baths as well as ice/tucks pads for swelling. Will continue to follow.  Jerrye Beavers, [facility] 06/03/2016, 11:25 AM Pager: [phone] Consults: [phone] Mon-Fri 7:00 am-4:30  pm Sat-Sun 7:00 am-11:30 am

## 2016-06-03 NOTE — Progress Notes (Signed)
PT Cancellation Note  Patient Details Name: Connor FickleRadovan Vargas MRN: [id number] DOB: April 28, 1960   Cancelled Treatment:    Reason Eval/Treat Not Completed: Patient at procedure or test/unavailable. Pt leaving for CT scan, will continue to follow as able.    Christiane HaBenjamin J. Ruthanne Mcneish, PT, CSCS Pager [phone] Office [phone]  06/03/2016, 1:41 PM

## 2016-06-03 NOTE — Progress Notes (Signed)
PROGRESS NOTE  Connor Vargas  ZOX:[id number] DOB: 1960-12-03 DOA: 05/31/2016   PCP: Kaleen Mask, MD   Brief Narrative:  56 y.o. male with recently diagnosed hypothyroidism (few months ago when TSH was reportedly > 90) and his PCP started him on synthroid and the dose increased from initially 50 mcg --> currently 175 mcg but pt continued to decline in terms of overall clinical state despite treatment. He was sent to [facility] hospital about two weeks prior to this admission and was discharged home with same diagnosis of hypothyroidism and ? Myxedema. His medications were not changed and pt was told to follow up with rheumatologist which pt is scheduled to see March 6th. Pt now comes here with progressively worsening weakness, LE and UE swelling, dyspnea exertional and at rest. 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 In addition, pt's says he has not eaten anything in the past two days.  Echo done in the hospital showed new onset sCHF ef 25%, cardiology consulted.   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 No infiltrates noted on the echo.  - monitor daily weights and strict I/O  - Aldactone started on 06/01/16 - cardiology  following- plans for [facility] and [facility] once medically more optimized.  - monitor electrolytes    Hyponatremia - likely from fluid overload and hypothyroidism - improving, ~ 129  - BMP in AM     Acquired hypothyroidism - TSH is trending down based on record review  - continue synthroid     Rhabdomyolysis - stable - suspect from his thyroid condition at this time vs polymyositis  - has been elevated for several weeks now per record review, apparently lingering around 5000 - pt has an appointment with rheumatologist on March 6th, 2018 - no IVF due to his cardiac status.  - called [place] rheumatology to discuss further recommendations     Transaminitis - suspect this is related to the above rhabdo and hypothyroid - hep panel ordered- neg    External hemorrhoids - surgery consulted for assistance   B/L Moderate Pleural effusion - related to cardiac etiology and potentially PM - repeat CT chest, if no change, consult IR to tap with fluid analysis   DVT prophylaxis: Lovenox SQ Code Status: Full  Family Communication: Wife at bedside. Disposition Plan: TBD   Consultants:   Cardiology   Surgery   Subjective: Patient continues to report of feeling LE heaviness but little improved from yesterday. No other complaints otherwise.   Objective: Vitals:   06/02/16 1930 06/02/16 2240 06/03/16 0452 06/03/16 0828  BP: (!) 92/51 (!) 94/50 (!) 105/58   Pulse: 82 82 85 83  Resp: 20  20   Temp: 98 F (36.7 C)  97.8 F (  36.6 C)   TempSrc: Oral  Oral   SpO2: 98%  99%   Weight:      Height:        Intake/Output Summary (Last 24 hours) at 06/03/16 1249 Last data filed at 06/02/16 1700  Gross per 24 hour  Intake                0 ml  Output              350 ml  Net             -350 ml   Filed Weights   05/31/16 1211 05/31/16 1628 06/01/16 0513  Weight: 82.1 kg (181 lb) 83.7 kg (184 lb 8 oz) 82.3 kg (181 lb 8 oz)    Examination:  General exam: Appears calm and comfortable    Respiratory system: decreased BS at b/l bases.  Cardiovascular system: S1 & S2 heard, RRR. No JVD, murmurs, rubs, gallops or clicks. No pedal edema. Gastrointestinal system: Abdomen is nondistended, soft and nontender. No organomegaly or masses felt. Normal bowel sounds heard. Central nervous system: Alert and oriented. No focal neurological deficits. Extremities: Symmetric 5 x 5 power. 2+ b/L swellig Skin: No rashes, lesions or ulcers Psychiatry: Judgement and insight appear normal. Mood & affect appropriate.    Data Reviewed:   CBC:  Recent Labs Lab 05/31/16 1219 06/01/16 0437 06/03/16 0242  WBC 10.5 9.5 10.5  HGB 12.6* 11.1* 11.1*  HCT 38.2* 33.4* 33.4*  MCV 88.0 88.4 88.1  PLT 339 275 292   Basic Metabolic Panel:  Recent Labs Lab 05/31/16 1219 06/01/16 0437 06/02/16 0326 06/03/16 0242  NA 125* 129* 129* 129*  K 4.7 4.0 4.1 4.2  CL 95* 96* 96* 95*  CO2 21* 24 25 25   GLUCOSE 94 84 84 88  BUN 13 14 15 15   CREATININE 0.64 0.70 0.77 0.66  CALCIUM 8.5* 8.0* 8.4* 8.3*  MG  --   --   --  2.0   Liver Function Tests:  Recent Labs Lab 05/31/16 1219 06/01/16 0437 06/02/16 0326  AST 222* 187* 202*  ALT 133* 112* 123*  ALKPHOS 60 47 52  BILITOT 0.9 0.8 0.8  PROT 5.9* 4.9* 5.4*  ALBUMIN 2.9* 2.6* 2.8*   Cardiac Enzymes:  Recent Labs Lab 05/31/16 1309 05/31/16 1715 05/31/16 2212 06/01/16 0437 06/02/16 0326 06/02/16 1247 06/03/16 0242  CKTOTAL 5,482*  --   --  4,440* 5,262* 4,749* 5,560*  CKMB  --   --   --   --   --  176.0*  --   TROPONINI 0.59* 0.53* 0.80* 0.61*  --   --   --    HbA1C:  Recent Labs  06/02/16 0326  HGBA1C 5.0   Lipid Profile:  Recent Labs  06/02/16 0326  CHOL 147  HDL 25*  LDLCALC 90  TRIG 161*  CHOLHDL 5.9   Thyroid Function Tests:  Recent Labs  05/31/16 1309  TSH 15.602*    Radiology Studies: No results found.  Scheduled Meds: . aspirin EC  81 mg Oral Daily  . carvedilol  6.25 mg Oral BID WC  . enoxaparin  (LOVENOX) injection  40 mg Subcutaneous Q24H  . furosemide  40 mg Intravenous Once  . furosemide  20 mg Oral Daily  . hydrocortisone  25 mg Rectal BID  . hydrocortisone-pramoxine  1 applicator Rectal Once  . levothyroxine  175 mcg Oral QAC breakfast  . losartan  25 mg Oral Daily  . sodium  chloride flush  3 mL Intravenous Q12H  . spironolactone  12.5 mg Oral BID  . witch hazel-glycerin   Topical TID   Continuous Infusions:   LOS: 3 days   Time spent: 35 mins   Debbora PrestoMAGICK-Philip Eckersley, MD [facility] Pager [phone]  If 7PM-7AM, please contact night-coverage [email or web address] Password [email or web address] 06/03/2016, 12:49 PM

## 2016-06-03 NOTE — Progress Notes (Signed)
Workup is still underway.  Pt. will need heart cath per cardiology.  Connor PippinMelissa Vargas will follow along Thursday/Friday.  Please call if questions.  Weldon PickingSusan Merrisa Skorupski PT Inpatient Rehab Admissions Coordinator Cell [phone] Office [phone]

## 2016-06-03 NOTE — Progress Notes (Signed)
OT Cancellation Note  Patient Details Name: Connor Vargas MRN: [id number] DOB: 1960-11-06   Cancelled Treatment:    Reason Eval/Treat Not Completed: Patient at procedure or test/ unavailable. Pt off the floor for CT. Will follow up as time allows.  Gaye AlkenBailey A Willella Harding M.S., OTR/L Pager: [phone]  06/03/2016, 2:00 PM

## 2016-06-03 NOTE — Progress Notes (Signed)
Progress Note  Patient Name: Connor Vargas Date of Encounter: 06/03/2016  Primary Cardiologist: New- Dr. Claiborne Billings  Subjective   Pt is having hemorrhoid pain. Had BM last night and has not been able to lie in the bed. Has been up in the chair all night with legs dependent. He thinks his breathing is slightly improved, but cannot concentrate on anything other than his hemorrhoid pain.   Inpatient Medications    Scheduled Meds: . aspirin EC  81 mg Oral Daily  . carvedilol  3.125 mg Oral BID WC  . enoxaparin (LOVENOX) injection  40 mg Subcutaneous Q24H  . furosemide  40 mg Intravenous Once  . furosemide  20 mg Oral Daily  . hydrocortisone  25 mg Rectal BID  . hydrocortisone-pramoxine  1 applicator Rectal Once  . levothyroxine  175 mcg Oral QAC breakfast  . sodium chloride flush  3 mL Intravenous Q12H  . spironolactone  12.5 mg Oral BID   Continuous Infusions:  PRN Meds: lactulose, ondansetron **OR** ondansetron (ZOFRAN) IV, oxyCODONE, traZODone   Vital Signs    Vitals:   06/02/16 1707 06/02/16 1930 06/02/16 2240 06/03/16 0452  BP: 103/61 (!) 92/51 (!) 94/50 (!) 105/58  Pulse: 81 82 82 85  Resp:  20  20  Temp:  98 F (36.7 C)  97.8 F (36.6 C)  TempSrc:  Oral  Oral  SpO2:  98%  99%  Weight:      Height:        Intake/Output Summary (Last 24 hours) at 06/03/16 0828 Last data filed at 06/02/16 1700  Gross per 24 hour  Intake                0 ml  Output              875 ml  Net             -875 ml   Filed Weights   05/31/16 1211 05/31/16 1628 06/01/16 0513  Weight: 181 lb (82.1 kg) 184 lb 8 oz (83.7 kg) 181 lb 8 oz (82.3 kg)    Telemetry    Sinus rhythm in the 80's - Personally Reviewed  ECG    06/02/2016 Sinus rhythm at 110 bpm, Q waves unchanged from previous - Personally Reviewed  Physical Exam   GEN: No acute distress.   Neck: + JVD Cardiac: RRR, no murmurs, rubs, or gallops.  Respiratory: Few fine crackles in the lung bases GI: Soft, nontender,  non-distended  MS: 2+ lower leg pitting edema; Non- pitting upper extremity edema. No deformity. Neuro:  Nonfocal  Psych: Anxious due to hemorrhoid pain  Labs    Chemistry  Recent Labs Lab 05/31/16 1219 06/01/16 0437 06/02/16 0326 06/03/16 0242  NA 125* 129* 129* 129*  K 4.7 4.0 4.1 4.2  CL 95* 96* 96* 95*  CO2 21* _0 GLUCOSE 94 84 84 88  BUN _1 CREATININE 0.64 0.70 0.77 0.66  CALCIUM 8.5* 8.0* 8.4* 8.3*  PROT 5.9* 4.9* 5.4*  --   ALBUMIN 2.9* 2.6* 2.8*  --   AST 222* 187* 202*  --   ALT 133* 112* 123*  --   ALKPHOS 60 47 52  --   BILITOT 0.9 0.8 0.8  --   GFRNONAA >60 >60 >60 >60  GFRAA >60 >60 >60 >60  ANIONGAP _2 Hematology  Recent Labs Lab 05/31/16 1219 06/01/16 0437 06/03/16 0242  WBC  10.5 9.5 10.5  RBC 4.34 3.78* 3.79*  HGB 12.6* 11.1* 11.1*  HCT 38.2* 33.4* 33.4*  MCV 88.0 88.4 88.1  MCH 29.0 29.4 29.3  MCHC 33.0 33.2 33.2  RDW 14.5 14.8 14.7  PLT 339 275 292    Cardiac Enzymes  Recent Labs Lab 05/31/16 1309 05/31/16 1715 05/31/16 2212 06/01/16 0437  TROPONINI 0.59* 0.53* 0.80* 0.61*   No results for input(s): TROPIPOC in the last 168 hours.   BNP  Recent Labs Lab 05/31/16 1219 06/02/16 1247  BNP 1,328.2* 1,168.0*    Lab Results  Component Value Date   TSH 15.602 (H) 05/31/2016   Lipid Panel     Component Value Date/Time   CHOL 147 06/02/2016 0326   TRIG 160 (H) 06/02/2016 0326   HDL 25 (L) 06/02/2016 0326   CHOLHDL 5.9 06/02/2016 0326   VLDL 32 06/02/2016 0326   LDLCALC 90 06/02/2016 0326   DDimer No results for input(s): DDIMER in the last 168 hours.   Radiology    No results found.  Cardiac Studies   Echo 06/01/2016:  Study Conclusions - Left ventricle: Septal apical inferior and distal anterior wall hypokinesis The cavity size was severely dilated. Wall thickness was normal. Systolic function was severely reduced. The estimated ejection fraction was in the range of 25% to 30%.  Left ventricular diastolic function parameters were normal. - Aortic valve: There was mild regurgitation. - Mitral valve: There was mild regurgitation. - Left atrium: The appendage was moderately to severely dilated. - Atrial septum: No defect or patent foramen ovale was identified. - Pericardium, extracardiac: Small to moderate posterior lateral pericardial effusion no tamponade Moderate left pleural effusion.   Patient Profile     56 y.o. male with past medical history of Bell's Palsy in 29. He was recently diagnosed with hypothyroid a few months ago with TSH reportedly >90 and his PCP started him on synthroid. He has elevated CK's and transaminase. We have been asked to consult for newl LV dysfunction with EF 25-30%.   Assessment & Plan    Acute systolic and diastolic dysfunction -Pt with new hypothyroidism. Mild orthopnea. Peripheral edema. -He has a very strong family history for premature CAD with his mother suffering multiple myocardial infarctions and dying at age 65. -EF 25-30%, down from 50-55% on 05/22/2016 -BNP 1328.2. Troponins 0.59, 0.53, 0.80, 0.61 -CXR: Moderately large bilateral pleural effusions. Small pericardial effusion Extensive coronary artery disease with heavy calcification left main and left coronary artery. Right coronary artery also calcified Bibasilar airspace disease which may be atelectasis or infiltrate. -The patient will need right and left cardiac catheterization for further evaluation of new LV dysfunction to assess for ischemic cause of dysfunction. May need a biopsy to investigate infiltrative process. Will defer to cardiologist. SCr 0.70. -Still has edema and few crackles in lung bases.  - Spironolactone was added 2/26 and BP is soft. Lasix changed to 20 mg po.  -I&O net negative 875 ml yesterday, net negative 4 L since admission.  -May consider adding ACE-I, low dose as BP allows. Continue aspirin.  -Carvedilol started, Will monitor BP. -Repeat  BNP 2/27 1,168 -Pt slowly improving with fluid status.  CAD -Significant coronary calcification noted on CT imaging. -Plan for right and left heart cath once fluid status improved.   Rhabdomyolysis -CPK 5,482--> 4,440. Has been elevated for several weeks. Back up to 5,262.  -Profound muscle weakness and pain -Was given IV fluids until found to have severe LV dysfunction and IV fluids stopped.  Hypothyroidism -TSH was >90 when diagnosed  A month or so ago. Started on synthroid. Was 36.989 on 05/12/2016 -05/31/2016 TSH 15.602 -Managed by IM. Levothyroxine 175 mcg daily  Constipation -with hemorrhoids, had BM after lactulose and having hemorrhoid pain. May consider GI consult.   Signed, Daune Perch, NP  06/03/2016, 8:28 AM   Pager: [phone]  Patient seen and examined. Agree with assessment and plan. CPK 4749 yesterday with MB 176 is 3.7% fraction.  ANA positive.  ESR 12; aldolase ordered but not resulted.   BNP 1168, improved but still c/w CHF.  Renal fxn stable with Cr. 0.66.  Na 129.  Will titrate carvedilol today to 6.25 mg as BP and HR allow.  Currently NSR at 83 on telemetry. Will add very low dose ARB and titrate as tolerates.   Troy Sine, MD, [facility] 06/03/2016 9:53 AM

## 2016-06-03 NOTE — Progress Notes (Signed)
PT Cancellation Note  Patient Details Name: Renae FickleRadovan Aslin MRN: [id number] DOB: 1960/04/12   Cancelled Treatment:    Reason Eval/Treat Not Completed: Pain limiting ability to participate. Pt stating that he is having hemorrhoid pain and unable to walk right now. Will check back as able.    Christiane HaBenjamin J. Jaelani Posa, PT, CSCS Pager [phone] Office [phone]  06/03/2016, 12:09 PM

## 2016-06-04 ENCOUNTER — Inpatient Hospital Stay (HOSPITAL_COMMUNITY): Payer: BLUE CROSS/BLUE SHIELD

## 2016-06-04 LAB — BASIC METABOLIC PANEL
ANION GAP: 9 (ref 5–15)
BUN: 16 mg/dL (ref 6–20)
CO2: 24 mmol/L (ref 22–32)
Calcium: 8.4 mg/dL — ABNORMAL LOW (ref 8.9–10.3)
Chloride: 96 mmol/L — ABNORMAL LOW (ref 101–111)
Creatinine, Ser: 0.78 mg/dL (ref 0.61–1.24)
GFR calc Af Amer: >60 mL/min (ref >60)
Glucose, Bld: 83 mg/dL (ref 65–99)
POTASSIUM: 4.3 mmol/L (ref 3.5–5.1)
SODIUM: 129 mmol/L — AB (ref 135–145)

## 2016-06-04 LAB — CBC
HEMATOCRIT: 32.8 % — AB (ref 39.0–52.0)
HEMOGLOBIN: 10.7 g/dL — AB (ref 13.0–17.0)
MCH: 28.6 pg (ref 26.0–34.0)
MCHC: 32.6 g/dL (ref 30.0–36.0)
MCV: 87.7 fL (ref 78.0–100.0)
Platelets: 281 10*3/uL (ref 150–400)
RBC: 3.74 MIL/uL — ABNORMAL LOW (ref 4.22–5.81)
RDW: 14.8 % (ref 11.5–15.5)
WBC: 8.6 10*3/uL (ref 4.0–10.5)

## 2016-06-04 LAB — GRAM STAIN

## 2016-06-04 LAB — BODY FLUID CELL COUNT WITH DIFFERENTIAL
EOS FL: 0 %
Lymphs, Fluid: 65 %
MONOCYTE-MACROPHAGE-SEROUS FLUID: 28 % — AB (ref 50–90)
NEUTROPHIL FLUID: 7 % (ref 0–25)
Total Nucleated Cell Count, Fluid: 137 cu mm (ref 0–1000)

## 2016-06-04 LAB — LACTATE DEHYDROGENASE, PLEURAL OR PERITONEAL FLUID: LD FL: 188 U/L — AB (ref 3–23)

## 2016-06-04 LAB — GLUCOSE, PLEURAL OR PERITONEAL FLUID: Glucose, Fluid: 95 mg/dL

## 2016-06-04 LAB — ALDOLASE: Aldolase: 85.5 U/L — ABNORMAL HIGH (ref 3.3–10.3)

## 2016-06-04 LAB — CK: Total CK: 5082 U/L — ABNORMAL HIGH (ref 49–397)

## 2016-06-04 LAB — PROTEIN, PLEURAL OR PERITONEAL FLUID

## 2016-06-04 LAB — MAGNESIUM: MAGNESIUM: 1.9 mg/dL (ref 1.7–2.4)

## 2016-06-04 MED ORDER — LIDOCAINE HCL (PF) 1 % IJ SOLN
INTRAMUSCULAR | Status: AC
  Filled 2016-06-04: qty 5

## 2016-06-04 MED ORDER — LIDOCAINE HCL (PF) 1 % IJ SOLN
INTRAMUSCULAR | Status: AC
  Filled 2016-06-04: qty 10

## 2016-06-04 NOTE — Progress Notes (Signed)
Physical Therapy Treatment Patient Details Name: Connor Vargas MRN: [id number] DOB: 10/08/1960 Today's Date: 06/04/2016    History of Present Illness  Connor Vargas is a 56 y.o. male admitted with weakness-- unclear etiology but appears to be multifactorial and secondary to hypothyroidism, rhabdomyolysis, acute pulmonary vascular congestion. He was recently diagnosed with hypothyroidism (> 90) and started him on synthroid with increase in dose but has continued to decline in terms of overall clinical state despite treatment.    PT Comments    Limited session today due to having thoracentesis earlier and with soft BP. Pt was able to ambulate in room 24 x 2 with min/guard, with MIN/guard for sit to stand, and MOD A for bed mobility due to difficulty moving legs.  Con't to recommend CIR.  Follow Up Recommendations  CIR     Equipment Recommendations  Rolling walker with 5" wheels    Recommendations for Other Services Rehab consult     Precautions / Restrictions Precautions Precautions: Fall Restrictions Weight Bearing Restrictions: No    Mobility  Bed Mobility Overal bed mobility: Needs Assistance Bed Mobility: Supine to Sit     Supine to sit: Mod assist     General bed mobility comments: MAx cueing for technique with sister jumping into help  Transfers Overall transfer level: Needs assistance Equipment used: Rolling walker (2 wheeled) Transfers: Sit to/from Stand Sit to Stand: Min guard         General transfer comment: cues for hand placement  Ambulation/Gait Ambulation/Gait assistance: Min guard Ambulation Distance (Feet): 24 Feet (x 2) Assistive device: Rolling walker (2 wheeled) Gait Pattern/deviations: Step-through pattern;Decreased stride length Gait velocity: decreased   General Gait Details: Pt taking standing breaks when turning.  Ambulated from bed <>door and then was going to sit, but decided he wanted to do same lap again.  Slow deliberate  gait.   Stairs            Wheelchair Mobility    Modified Rankin (Stroke Patients Only)       Balance Overall balance assessment: Needs assistance Sitting-balance support: No upper extremity supported;Feet supported Sitting balance-Leahy Scale: Good     Standing balance support: Bilateral upper extremity supported Standing balance-Leahy Scale: Poor Standing balance comment: requires RW                    Cognition Arousal/Alertness: Awake/alert Behavior During Therapy: WFL for tasks assessed/performed Overall Cognitive Status: Within Functional Limits for tasks assessed                      Exercises General Exercises - Lower Extremity Long Arc Quad: AROM;Both;5 reps;Seated    General Comments        Pertinent Vitals/Pain Pain Assessment: Faces Faces Pain Scale: Hurts even more Pain Location: LE Pain Descriptors / Indicators: Grimacing;Guarding Pain Intervention(s): Limited activity within patient's tolerance;Monitored during session;Repositioned    Home Living                      Prior Function            PT Goals (current goals can now be found in the care plan section) Acute Rehab PT Goals Patient Stated Goal: to figure out what is wrong PT Goal Formulation: With patient Time For Goal Achievement: 06/08/16 Potential to Achieve Goals: Good Progress towards PT goals: Progressing toward goals    Frequency    Min 3X/week      PT Plan Current  plan remains appropriate    Co-evaluation             End of Session Equipment Utilized During Treatment: Gait belt Activity Tolerance: Patient limited by fatigue Patient left: in chair;with call bell/phone within reach;with family/visitor present Nurse Communication: Mobility status PT Visit Diagnosis: Difficulty in walking, not elsewhere classified (R26.2)     Time: 1610-9604 PT Time Calculation (min) (ACUTE ONLY): 15 min  Charges:  $Gait Training: 8-22 mins                     G Codes:       Shawnay Bramel LUBECK 06/04/2016, 2:26 PM

## 2016-06-04 NOTE — Progress Notes (Signed)
Progress Note  Patient Name: Connor Vargas Date of Encounter: 06/04/2016  Primary Cardiologist: New- Dr. Tresa Endo  Subjective   No complaints this am, just weak. He had pleural effusion tapped this am.   Inpatient Medications    Scheduled Meds: . aspirin EC  81 mg Oral Daily  . carvedilol  6.25 mg Oral BID WC  . enoxaparin (LOVENOX) injection  40 mg Subcutaneous Q24H  . furosemide  40 mg Intravenous Once  . furosemide  20 mg Oral Daily  . hydrocortisone  25 mg Rectal BID  . hydrocortisone-pramoxine  1 applicator Rectal Once  . levothyroxine  175 mcg Oral QAC breakfast  . lidocaine (PF)      . losartan  25 mg Oral Daily  . sodium chloride flush  3 mL Intravenous Q12H  . spironolactone  12.5 mg Oral BID  . witch hazel-glycerin   Topical TID   Continuous Infusions:  PRN Meds: lactulose, lidocaine, ondansetron **OR** ondansetron (ZOFRAN) IV, oxyCODONE, traZODone   Vital Signs    Vitals:   06/03/16 2035 06/04/16 0300 06/04/16 0406 06/04/16 1058  BP: (!) 97/50 (!) 97/48    Pulse: 81   84  Resp: 18 18    Temp: 97.9 F (36.6 C) 97.3 F (36.3 C)    TempSrc: Oral Oral    SpO2: 99% 97%    Weight:   183 lb 4.8 oz (83.1 kg)   Height:       No intake or output data in the 24 hours ending 06/04/16 1101 Filed Weights   05/31/16 1628 06/01/16 0513 06/04/16 0406  Weight: 184 lb 8 oz (83.7 kg) 181 lb 8 oz (82.3 kg) 183 lb 4.8 oz (83.1 kg)    Telemetry    Sinus rhythm in the 80's - Personally Reviewed  ECG    06/02/2016 Sinus rhythm at 110 bpm, Q waves unchanged from previous - Personally Reviewed  Physical Exam   GEN: No acute distress.  Pale Neck: no Jvd Cardiac: RRR, no murmurs, rubs, or gallops.  Respiratory: bilateral basilar crackles GI: Soft, nontender, non-distended  MS: 2+ lower leg pitting edema; Non- pitting upper extremity edema. No deformity. Neuro:  Nonfocal  Psych: normal  Labs    Chemistry  Recent Labs Lab 05/31/16 1219 06/01/16 0437  06/02/16 0326 06/03/16 0242 06/04/16 0343  NA 125* 129* 129* 129* 129*  K 4.7 4.0 4.1 4.2 4.3  CL 95* 96* 96* 95* 96*  CO2 21* 24 25 25 24   GLUCOSE 94 84 84 88 83  BUN 13 14 15 15 16   CREATININE 0.64 0.70 0.77 0.66 0.78  CALCIUM 8.5* 8.0* 8.4* 8.3* 8.4*  PROT 5.9* 4.9* 5.4*  --   --   ALBUMIN 2.9* 2.6* 2.8*  --   --   AST 222* 187* 202*  --   --   ALT 133* 112* 123*  --   --   ALKPHOS 60 47 52  --   --   BILITOT 0.9 0.8 0.8  --   --   GFRNONAA >60 >60 >60 >60 >60  GFRAA >60 >60 >60 >60 >60  ANIONGAP 9 9 8 9 9      Hematology  Recent Labs Lab 06/01/16 0437 06/03/16 0242 06/04/16 0343  WBC 9.5 10.5 8.6  RBC 3.78* 3.79* 3.74*  HGB 11.1* 11.1* 10.7*  HCT 33.4* 33.4* 32.8*  MCV 88.4 88.1 87.7  MCH 29.4 29.3 28.6  MCHC 33.2 33.2 32.6  RDW 14.8 14.7 14.8  PLT 275 292  281    Cardiac Enzymes  Recent Labs Lab 05/31/16 1309 05/31/16 1715 05/31/16 2212 06/01/16 0437  TROPONINI 0.59* 0.53* 0.80* 0.61*   No results for input(s): TROPIPOC in the last 168 hours.   BNP  Recent Labs Lab 05/31/16 1219 06/02/16 1247  BNP 1,328.2* 1,168.0*    Lab Results  Component Value Date   TSH 15.602 (H) 05/31/2016   Lipid Panel     Component Value Date/Time   CHOL 147 06/02/2016 0326   TRIG 160 (H) 06/02/2016 0326   HDL 25 (L) 06/02/2016 0326   CHOLHDL 5.9 06/02/2016 0326   VLDL 32 06/02/2016 0326   LDLCALC 90 06/02/2016 0326   DDimer No results for input(s): DDIMER in the last 168 hours.   Radiology    Dg Chest 1 View  Result Date: 06/04/2016 CLINICAL DATA:  Right-sided thoracentesis EXAM: CHEST 1 VIEW COMPARISON:  CT chest of 06/03/2016 FINDINGS: Much of the right pleural effusion has been evacuated by right thoracentesis. No pneumothorax is seen. A small left effusion is present with bibasilar atelectasis left-greater-than-right. Cardiomegaly is stable. IMPRESSION: Much of the right pleural effusion has been evacuated by right thoracentesis. No pneumothorax is seen.  Electronically Signed   By: Dwyane DeePaul  Barry M.D.   On: 06/04/2016 10:21   Ct Chest Wo Contrast  Result Date: 06/03/2016 CLINICAL DATA:  Pleural effusions.  Dyspnea. EXAM: CT CHEST WITHOUT CONTRAST TECHNIQUE: Multidetector CT imaging of the chest was performed following the standard protocol without IV contrast. COMPARISON:  CT chest without contrast 05/31/2016 FINDINGS: Cardiovascular: The heart size is normal. Dense coronary artery calcifications are present. The small pericardial effusion is stable. The aorta and pulmonary arteries are unremarkable. Mediastinum/Nodes: Subcentimeter mediastinal lymph nodes are stable. No significant axillary adenopathy is present. Lungs/Pleura: Moderate size pleural effusions are similar the prior study. Dependent airspace disease bilaterally is not significantly changed. There is no central obstructing mass. There is a ground-glass attenuation are present as well. High-density material is again noted within the lower lobe bronchi bilaterally Upper Abdomen: Gallstones are present. No significant inflammatory changes are present to suggest cholecystitis. Limited imaging of the abdomen is otherwise unremarkable. Musculoskeletal: Vertebral body heights and alignment are maintained. No focal lytic or blastic lesions are present. IMPRESSION: 1. No significant interval change in moderate bilateral pleural effusions and bibasilar airspace disease. While this likely reflects atelectasis, infection is not excluded. 2. Stable small pericardial effusion. 3. Atherosclerotic disease including dense coronary artery disease. 4. High-density material within the lower lobe bronchi remains concerning for aspiration. Electronically Signed   By: Marin Robertshristopher  Mattern M.D.   On: 06/03/2016 14:24    Cardiac Studies   Echo 06/01/2016:  Study Conclusions - Left ventricle: Septal apical inferior and distal anterior wall hypokinesis The cavity size was severely dilated. Wall thickness was  normal. Systolic function was severely reduced. The estimated ejection fraction was in the range of 25% to 30%. Left ventricular diastolic function parameters were normal. - Aortic valve: There was mild regurgitation. - Mitral valve: There was mild regurgitation. - Left atrium: The appendage was moderately to severely dilated. - Atrial septum: No defect or patent foramen ovale was identified. - Pericardium, extracardiac: Small to moderate posterior lateral pericardial effusion no tamponade Moderate left pleural effusion.   Patient Profile     56 y.o. male with past medical history of Bell's Palsy in [id number]. He was recently diagnosed with hypothyroid a few months ago with TSH reportedly >90 and his PCP started him on synthroid. He has  elevated CK's and transaminase. We have been asked to consult for newl LV dysfunction with EF 25-30%.   Assessment & Plan    Acute systolic and diastolic dysfunction -Pt with new hypothyroidism. Mild orthopnea. Peripheral edema. -He has a very strong family history for premature CAD with his mother suffering multiple myocardial infarctions and dying at age 2. -EF 25-30%, down from 50-55% on 05/22/2016 at [facility] -BNP 1328.2. Troponins 0.59, 0.53, 0.80, 0.61 -CXR: Moderately large bilateral pleural effusions. S/P Rt effusion tap this am-800cc.  -The patient will need right and left cardiac catheterization for further evaluation of new LV dysfunction to assess for ischemic cause of dysfunction. May need a biopsy to investigate infiltrative process.   CAD -Significant coronary calcification noted on CT imaging. -Plan for right and left heart cath once fluid status improved.   Rhabdomyolysis/ polymyositis.  -CPK 5,482--> 4,440. Has been elevated for several weeks. Back up to 5,262.  -Profound muscle weakness and pain -Was given IV fluids until found to have severe LV dysfunction and IV fluids stopped.  Hypothyroidism -TSH was >90 when diagnosed  A month  or so ago. Started on synthroid. Was 36.989 on 05/12/2016 -05/31/2016 TSH 15.602 -Managed by IM. Levothyroxine 175 mcg daily  Constipation -with hemorrhoids, better today  Plan: Per PCP. He probably needs muscle tissue biopsy.   Jolene Provost, PA-C  06/04/2016, 11:01 AM   Pager: [phone]   Patient seen and examined. Agree with assessment and plan. Aldolase not yet back from 2/27? Was this done. will re-check ? Polymyositis ? myedema heart. Persistent CPK not consistent with ACS. Recommend rheumatologic evaluation. ? Need for steroids. Will ultimately need a R and Left heart cath. Had US guided diagnotic and therapeuticright thoracentesis today with 800 cc yellow fluid removed by radiology. No pneumothorax on f/u CXR, with minimal residual effusion remaining on right and small on left. Rhythm stable with NSR at 80. Now on coreg at 6.25 bid; spironolactone 12.5 bid, and losartan 25 mg.  Titrate losartan to 25 mg bid as BP allows.    Lennette Bihari, MD, [facility] 06/04/2016 11:31 AM

## 2016-06-04 NOTE — Progress Notes (Signed)
Pt BP 86/53 upon check at 1700. HR 84. Pt describes feeling sleepy. Paged Dr. Izola PriceMyers. Given verbal order to hold Coreg this afternoon as well as Aldactone tonight. Oncoming RN will be made aware. Pt resting comfortably in bed, family at bedside, call bell within reach.   Connor Rombergaitlin S Bumbledare, RN

## 2016-06-04 NOTE — Progress Notes (Signed)
PROGRESS NOTE  Connor Vargas  RJJ:[id number] DOB: 02-13-1961 DOA: 05/31/2016   PCP: Kaleen MaskELKINS,WILSON OLIVER, MD   Brief Narrative:  56 y.o. male with recently diagnosed hypothyroidism (few months ago when TSH was reportedly > 90) and his PCP started him on synthroid and the dose increased from initially 50 mcg --> currently 175 mcg but pt continued to decline in terms of overall clinical state despite treatment. [facility] about two weeks prior to this admission and was discharged home with same diagnosis of hypothyroidism and ? Myxedema. His medications were not changed and pt was told to follow up with rheumatologist which pt is scheduled to see March 6th. Pt now comes here with progressively worsening weakness, LE and UE swelling, dyspnea exertional and at rest. 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 In addition, pt's says he has not eaten anything in the past two days.  Echo done in the hospital showed new onset sCHF ef 25%, cardiology consulted.   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 No infiltrates noted on the echo.  - monitor daily weights and strict I/O  - Aldactone started on 06/01/16 - cardiology following- [facility] and [facility] once medically more optimized.  - monitor electrolytes    Hyponatremia - likely from fluid overload and hypothyroidism - improving, ~ 129  - BMP in AM     Acquired hypothyroidism - TSH is trending down based on record review  - continue synthroid     Rhabdomyolysis  - suspect from his thyroid condition at this time vs polymyositis  - has been elevated for several weeks now per record review, apparently lingering around 5000 - pt has an appointment with rheumatologist on March 6th, 2018 - no IVF due to his cardiac status.  - called [place] [facility]ology to discuss further recommendations     Transaminitis - suspect this is related to the above rhabdo and hypothyroid - hep panel ordered- neg    External hemorrhoids - surgery consulted for assistance     B/L Moderate Pleural effusion - related to cardiac etiology and potentially PM - 780 cc fluid removed from left side and sent for analysis  - pt tolerated well   DVT prophylaxis: Lovenox SQ Code Status: Full  Family Communication: Wife at bedside. Disposition Plan: TBD   Consultants:   Cardiology   Surgery   Subjective: Patient continues to report of feeling LE heaviness but little improved from yesterday. No other complaints otherwise.   Objective: Vitals:   06/04/16 0950 06/04/16 1007 06/04/16 1058 06/04/16  1340  BP: (!) 98/56 (!) 92/54  (!) 88/56  Pulse:   84 78  Resp:    18  Temp:    98.5 F (36.9 C)  TempSrc:    Oral  SpO2:    100%  Weight:      Height:        Intake/Output Summary (Last 24 hours) at 06/04/16 1423 Last data filed at 06/04/16 1230  Gross per 24 hour  Intake              240 ml  Output                0 ml  Net              240 ml   Filed Weights   05/31/16 1628 06/01/16 0513 06/04/16 0406  Weight: 83.7 kg (184 lb  8 oz) 82.3 kg (181 lb 8 oz) 83.1 kg (183 lb 4.8 oz)    Examination:  General exam: Appears calm and comfortable  Respiratory system: decreased BS at b/l bases.  Cardiovascular system: S1 & S2 heard, RRR. No JVD, murmurs, rubs, gallops or clicks. No pedal edema. Gastrointestinal system: Abdomen is nondistended, soft and nontender. No organomegaly or masses felt. Normal bowel sounds heard. Central nervous system: Alert and oriented. No focal neurological deficits. Extremities: Symmetric 5 x 5 power. 2+ b/L swellig Skin: No rashes, lesions or ulcers Psychiatry: Judgement and insight appear normal. Mood & affect appropriate.    Data Reviewed:   CBC:  Recent Labs Lab 05/31/16 1219 06/01/16 0437 06/03/16 0242 06/04/16 0343  WBC 10.5 9.5 10.5 8.6  HGB 12.6* 11.1* 11.1* 10.7*  HCT 38.2* 33.4* 33.4* 32.8*  MCV 88.0 88.4 88.1 87.7  PLT 339 275 292 281   Basic Metabolic Panel:  Recent Labs Lab 05/31/16 1219 06/01/16 0437 06/02/16 0326 06/03/16 0242 06/04/16 0343  NA 125* 129* 129* 129* 129*  K 4.7 4.0 4.1 4.2 4.3  CL 95* 96* 96* 95* 96*  CO2 21* 24 25 25 24   GLUCOSE 94 84 84 88 83  BUN 13 14 15 15 16   CREATININE 0.64 0.70 0.77 0.66 0.78  CALCIUM 8.5* 8.0* 8.4* 8.3* 8.4*  MG  --   --   --  2.0 1.9   Liver Function Tests:  Recent Labs Lab 05/31/16 1219 06/01/16 0437 06/02/16 0326  AST 222* 187* 202*  ALT 133* 112* 123*  ALKPHOS 60 47 52  BILITOT 0.9 0.8 0.8  PROT 5.9* 4.9* 5.4*  ALBUMIN 2.9* 2.6* 2.8*   Cardiac Enzymes:  Recent Labs Lab 05/31/16 1309 05/31/16 1715 05/31/16 2212 06/01/16 0437 06/02/16 0326 06/02/16 1247 06/03/16 0242 06/04/16 0343  CKTOTAL 5,482*  --   --  4,440* 5,262* 4,749* 5,560* 5,082*  CKMB  --   --   --   --   --  176.0*  --   --   TROPONINI 0.59* 0.53* 0.80* 0.61*  --   --   --   --    HbA1C:  Recent Labs  06/02/16 0326  HGBA1C 5.0   Lipid Profile:  Recent Labs  06/02/16 0326  CHOL 147  HDL 25*  LDLCALC 90  TRIG  160*  CHOLHDL 5.9   Thyroid Function Tests: No results for input(s): TSH, T4TOTAL, FREET4, T3FREE, THYROIDAB in the last 72 hours.  Radiology Studies: Dg Chest 1 View  Result Date: 06/04/2016 CLINICAL DATA:  Right-sided thoracentesis EXAM: CHEST 1 VIEW COMPARISON:  CT chest of 06/03/2016 FINDINGS: Much  of the right pleural effusion has been evacuated by right thoracentesis. No pneumothorax is seen. A small left effusion is present with bibasilar atelectasis left-greater-than-right. Cardiomegaly is stable. IMPRESSION: Much of the right pleural effusion has been evacuated by right thoracentesis. No pneumothorax is seen. Electronically Signed   By: Dwyane Dee M.D.   On: 06/04/2016 10:21   Ct Chest Wo Contrast  Result Date: 06/03/2016 CLINICAL DATA:  Pleural effusions.  Dyspnea. EXAM: CT CHEST WITHOUT CONTRAST TECHNIQUE: Multidetector CT imaging of the chest was performed following the standard protocol without IV contrast. COMPARISON:  CT chest without contrast 05/31/2016 FINDINGS: Cardiovascular: The heart size is normal. Dense coronary artery calcifications are present. The small pericardial effusion is stable. The aorta and pulmonary arteries are unremarkable. Mediastinum/Nodes: Subcentimeter mediastinal lymph nodes are stable. No significant axillary adenopathy is present. Lungs/Pleura: Moderate size pleural effusions are similar the prior study. Dependent airspace disease bilaterally is not significantly changed. There is no central obstructing mass. There is a ground-glass attenuation are present as well. High-density material is again noted within the lower lobe bronchi bilaterally Upper Abdomen: Gallstones are present. No significant inflammatory changes are present to suggest cholecystitis. Limited imaging of the abdomen is otherwise unremarkable. Musculoskeletal: Vertebral body heights and alignment are maintained. No focal lytic or blastic lesions are present. IMPRESSION: 1. No significant  interval change in moderate bilateral pleural effusions and bibasilar airspace disease. While this likely reflects atelectasis, infection is not excluded. 2. Stable small pericardial effusion. 3. Atherosclerotic disease including dense coronary artery disease. 4. High-density material within the lower lobe bronchi remains concerning for aspiration. Electronically Signed   By: Marin Roberts M.D.   On: 06/03/2016 14:24    Scheduled Meds: . aspirin EC  81 mg Oral Daily  . carvedilol  6.25 mg Oral BID WC  . enoxaparin (LOVENOX) injection  40 mg Subcutaneous Q24H  . furosemide  40 mg Intravenous Once  . furosemide  20 mg Oral Daily  . hydrocortisone  25 mg Rectal BID  . hydrocortisone-pramoxine  1 applicator Rectal Once  . levothyroxine  175 mcg Oral QAC breakfast  . lidocaine (PF)      . losartan  25 mg Oral Daily  . sodium chloride flush  3 mL Intravenous Q12H  . spironolactone  12.5 mg Oral BID  . witch hazel-glycerin   Topical TID   Continuous Infusions:   LOS: 4 days   Time spent: 35 mins   Debbora Presto, MD [facility] Pager [phone]  If 7PM-7AM, please contact night-coverage [email or web address] Password [email or web address] 06/04/2016, 2:23 PM

## 2016-06-04 NOTE — Progress Notes (Signed)
Patient ID: Renae FickleRadovan Vargas, male   DOB: [birth year], 58 y.o.   MRN: [id number]  Pt reports perianal pain is better On exam, hemorrhoid is only mildly enlarged  Needs continued conservative management with meds ordered No surgery needed Will sign off. He can follow up with us as an outpt as needed.

## 2016-06-04 NOTE — Progress Notes (Signed)
Occupational Therapy Treatment Patient Details Name: Connor Vargas MRN: [id number] DOB: [birth year] Today's Date: 06/04/2016    History of present illness  Connor Vargas is a 56 y.o. male admitted with weakness-- unclear etiology but appears to be multifactorial and secondary to hypothyroidism, rhabdomyolysis, acute pulmonary vascular congestion. He was recently diagnosed with hypothyroidism (> 90) and started him on synthroid with increase in dose but has continued to decline in terms of overall clinical state despite treatment.   OT comments  Pt declining participation in ADL this session but agreeable to functional mobility in room. Pt required mod assist for sit to stand from chair and min guard assist for functional mobility. Pt required mod assist overall for bed mobility (back to bed and repositioning). D/c plan remains appropriate. Will continue to follow acutely.   Follow Up Recommendations  CIR;Supervision/Assistance - 24 hour    Equipment Recommendations  3 in 1 bedside commode;Tub/shower seat    Recommendations for Other Services      Precautions / Restrictions Precautions Precautions: Fall Restrictions Weight Bearing Restrictions: No       Mobility Bed Mobility Overal bed mobility: Needs Assistance Bed Mobility: Sit to Supine     Supine to sit: Mod assist Sit to supine: Mod assist   General bed mobility comments: Assist for LEs back to bed and scooting up to reposition. Cues for technique  Transfers Overall transfer level: Needs assistance Equipment used: Rolling walker (2 wheeled) Transfers: Sit to/from Stand Sit to Stand: Mod assist         General transfer comment: Mod assist to boost up from recliner chair. Cues for hand placement    Balance Overall balance assessment: Needs assistance Sitting-balance support: Feet supported;No upper extremity supported Sitting balance-Leahy Scale: Good     Standing balance support: Bilateral upper extremity  supported Standing balance-Leahy Scale: Poor Standing balance comment: RW for support                   ADL Overall ADL's : Needs assistance/impaired                         Toilet Transfer: Moderate assistance;Min guard;Ambulation;RW Toilet Transfer Details (indicate cue type and reason): Mod assist for sit to stand and min guard for functional mobility. Simulated         Functional mobility during ADLs: Min guard;Rolling walker General ADL Comments: Pt declining to participate in ADL at this time, agreeable to mobility in room with return to bed.      Vision                     Perception     Praxis      Cognition   Behavior During Therapy: [facility]FL for tasks assessed/performed Overall Cognitive Status: Within Functional Limits for tasks assessed                         Exercises General Exercises - Lower Extremity Long Arc Quad: AROM;Both;5 reps;Seated   Shoulder Instructions       General Comments      Pertinent Vitals/ Pain       Pain Assessment: Faces Faces Pain Scale: Hurts little more Pain Location: bil LEs Pain Descriptors / Indicators: Sore (heavy) Pain Intervention(s): Monitored during session;Limited activity within patient's tolerance  Home Living  Prior Functioning/Environment              Frequency  Min 3X/week        Progress Toward Goals  OT Goals(current goals can now be found in the care plan section)  Progress towards OT goals: Progressing toward goals  Acute Rehab OT Goals Patient Stated Goal: to figure out what is wrong OT Goal Formulation: With patient  Plan Discharge plan remains appropriate    Co-evaluation                 End of Session Equipment Utilized During Treatment: Rolling walker  OT Visit Diagnosis: Unsteadiness on feet (R26.81);Muscle weakness (generalized) (M62.81);Pain Pain - Right/Left:  (both) Pain - part  of body: Leg   Activity Tolerance Patient tolerated treatment well   Patient Left in bed;with call bell/phone within reach;with family/visitor present   Nurse Communication          Time: 1610-9604 OT Time Calculation (min): 13 min  Charges: OT General Charges $OT Visit: 1 Procedure OT Treatments $Therapeutic Activity: 8-22 mins  Tasharra Nodine A. Brett Albino, M.S., OTR/L Pager: [phone]   Gaye Alken 06/04/2016, 3:49 PM

## 2016-06-04 NOTE — Procedures (Signed)
Ultrasound-guided diagnostic and therapeutic right thoracentesis performed yielding 0.8 liters of yellow colored fluid. No immediate complications. Follow-up chest x-ray pending.       Connor Vargas E 10:11 AM 06/04/2016

## 2016-06-05 ENCOUNTER — Inpatient Hospital Stay (HOSPITAL_COMMUNITY): Payer: BLUE CROSS/BLUE SHIELD

## 2016-06-05 LAB — BASIC METABOLIC PANEL
Anion gap: 8 (ref 5–15)
BUN: 19 mg/dL (ref 6–20)
CALCIUM: 8.2 mg/dL — AB (ref 8.9–10.3)
CO2: 24 mmol/L (ref 22–32)
CREATININE: 0.91 mg/dL (ref 0.61–1.24)
Chloride: 96 mmol/L — ABNORMAL LOW (ref 101–111)
Glucose, Bld: 76 mg/dL (ref 65–99)
Potassium: 4.4 mmol/L (ref 3.5–5.1)
SODIUM: 128 mmol/L — AB (ref 135–145)

## 2016-06-05 LAB — PH, BODY FLUID: pH, Body Fluid: 8.1

## 2016-06-05 LAB — ALDOLASE: Aldolase: 72.6 U/L — ABNORMAL HIGH (ref 3.3–10.3)

## 2016-06-05 LAB — CBC
HCT: 31.7 % — ABNORMAL LOW (ref 39.0–52.0)
Hemoglobin: 10.3 g/dL — ABNORMAL LOW (ref 13.0–17.0)
MCH: 28.6 pg (ref 26.0–34.0)
MCHC: 32.5 g/dL (ref 30.0–36.0)
MCV: 88.1 fL (ref 78.0–100.0)
PLATELETS: 261 10*3/uL (ref 150–400)
RBC: 3.6 MIL/uL — ABNORMAL LOW (ref 4.22–5.81)
RDW: 14.9 % (ref 11.5–15.5)
WBC: 7.2 10*3/uL (ref 4.0–10.5)

## 2016-06-05 LAB — MAGNESIUM: MAGNESIUM: 2 mg/dL (ref 1.7–2.4)

## 2016-06-05 LAB — BRAIN NATRIURETIC PEPTIDE: B Natriuretic Peptide: 1155.7 pg/mL — ABNORMAL HIGH (ref 0.0–100.0)

## 2016-06-05 LAB — AMYLASE, PLEURAL OR PERITONEAL FLUID: Amylase, Fluid: 44 U/L

## 2016-06-05 LAB — CK: CK TOTAL: 3683 U/L — AB (ref 49–397)

## 2016-06-05 MED ORDER — CARVEDILOL 3.125 MG PO TABS
3.1250 mg | ORAL_TABLET | Freq: Two times a day (BID) | ORAL | Status: DC
  Administered 2016-06-05 – 2016-06-07 (×4): 3.125 mg via ORAL
  Filled 2016-06-05 (×5): qty 1

## 2016-06-05 MED ORDER — LOSARTAN POTASSIUM 25 MG PO TABS
25.0000 mg | ORAL_TABLET | Freq: Every day | ORAL | Status: DC
  Administered 2016-06-06: 25 mg via ORAL
  Filled 2016-06-05: qty 1

## 2016-06-05 MED ORDER — LIDOCAINE HCL 1 % IJ SOLN
INTRAMUSCULAR | Status: AC
  Filled 2016-06-05: qty 20

## 2016-06-05 NOTE — Progress Notes (Signed)
Inpatient Rehabilitation  Continuing to follow at a distance while medical workup is ongoing.  Plan to follow closely and initiate insurance as needed once heart cath has been completed.  Plan for my co-worker Weldon PickingSusan Blankenship to follow up Monday.    Charlane FerrettiMelissa Jenel Gierke, M.A., CCC/SLP Admission Coordinator  [facility]  Cell [phone]

## 2016-06-05 NOTE — Progress Notes (Addendum)
Progress Note  Patient Name: Connor Vargas Date of Encounter: 06/05/2016  Primary Cardiologist: New- Dr. Tresa Endo  Subjective   No complaints this am, just weak. No SOB, almost flat in bed.    Inpatient Medications    Scheduled Meds: . aspirin EC  81 mg Oral Daily  . carvedilol  6.25 mg Oral BID WC  . enoxaparin (LOVENOX) injection  40 mg Subcutaneous Q24H  . furosemide  40 mg Intravenous Once  . furosemide  20 mg Oral Daily  . hydrocortisone  25 mg Rectal BID  . hydrocortisone-pramoxine  1 applicator Rectal Once  . levothyroxine  175 mcg Oral QAC breakfast  . losartan  25 mg Oral Daily  . sodium chloride flush  3 mL Intravenous Q12H  . spironolactone  12.5 mg Oral BID  . witch hazel-glycerin   Topical TID   Continuous Infusions:  PRN Meds: lactulose, lidocaine, ondansetron **OR** ondansetron (ZOFRAN) IV, oxyCODONE, traZODone   Vital Signs    Vitals:   06/04/16 1340 06/04/16 1701 06/04/16 2101 06/05/16 0424  BP: (!) 88/56 (!) 86/53 (!) 97/59 (!) 96/54  Pulse: 78 84 82 87  Resp: 18  18 18   Temp: 98.5 F (36.9 C)  98.2 F (36.8 C) 97.9 F (36.6 C)  TempSrc: Oral  Oral Oral  SpO2: 100%  99% 97%  Weight:    188 lb 6.4 oz (85.5 kg)  Height:        Intake/Output Summary (Last 24 hours) at 06/05/16 1014 Last data filed at 06/04/16 2200  Gross per 24 hour  Intake              240 ml  Output              400 ml  Net             -160 ml   Filed Weights   06/01/16 0513 06/04/16 0406 06/05/16 0424  Weight: 181 lb 8 oz (82.3 kg) 183 lb 4.8 oz (83.1 kg) 188 lb 6.4 oz (85.5 kg)    Telemetry    Sinus rhythm in the 80's - Personally Reviewed  ECG    06/02/2016 Sinus rhythm at 110 bpm, Q waves unchanged from previous - Personally Reviewed  Physical Exam   GEN: No acute distress. Pale Neck: no Jvd Cardiac: RRR, no murmurs, rubs, or gallops.  Respiratory:  basilar crackles Lt 1/3 GI: Soft, nontender, non-distended  MS: 2+ lower leg pitting edema; Non- pitting  upper extremity edema. No deformity. Neuro:  Nonfocal  Psych: normal  Labs    Chemistry  Recent Labs Lab 05/31/16 1219 06/01/16 0437 06/02/16 0326 06/03/16 0242 06/04/16 0343 06/05/16 0345  NA 125* 129* 129* 129* 129* 128*  K 4.7 4.0 4.1 4.2 4.3 4.4  CL 95* 96* 96* 95* 96* 96*  CO2 21* 24 25 25 24 24   GLUCOSE 94 84 84 88 83 76  BUN 13 14 15 15 16 19   CREATININE 0.64 0.70 0.77 0.66 0.78 0.91  CALCIUM 8.5* 8.0* 8.4* 8.3* 8.4* 8.2*  PROT 5.9* 4.9* 5.4*  --   --   --   ALBUMIN 2.9* 2.6* 2.8*  --   --   --   AST 222* 187* 202*  --   --   --   ALT 133* 112* 123*  --   --   --   ALKPHOS 60 47 52  --   --   --   BILITOT 0.9 0.8 0.8  --   --   --  GFRNONAA >60 >60 >60 >60 >60 >60  GFRAA >60 >60 >60 >60 >60 >60  ANIONGAP 9 9 8 9 9 8      Hematology  Recent Labs Lab 06/03/16 0242 06/04/16 0343 06/05/16 0345  WBC 10.5 8.6 7.2  RBC 3.79* 3.74* 3.60*  HGB 11.1* 10.7* 10.3*  HCT 33.4* 32.8* 31.7*  MCV 88.1 87.7 88.1  MCH 29.3 28.6 28.6  MCHC 33.2 32.6 32.5  RDW 14.7 14.8 14.9  PLT 292 281 261    Cardiac Enzymes  Recent Labs Lab 05/31/16 1309 05/31/16 1715 05/31/16 2212 06/01/16 0437  TROPONINI 0.59* 0.53* 0.80* 0.61*   No results for input(s): TROPIPOC in the last 168 hours.   BNP  Recent Labs Lab 05/31/16 1219 06/02/16 1247 06/05/16 0345  BNP 1,328.2* 1,168.0* 1,155.7*    Lab Results  Component Value Date   TSH 15.602 (H) 05/31/2016   Lipid Panel     Component Value Date/Time   CHOL 147 06/02/2016 0326   TRIG 160 (H) 06/02/2016 0326   HDL 25 (L) 06/02/2016 0326   CHOLHDL 5.9 06/02/2016 0326   VLDL 32 06/02/2016 0326   LDLCALC 90 06/02/2016 0326   DDimer No results for input(s): DDIMER in the last 168 hours.   Radiology    Dg Chest 1 View  Result Date: 06/04/2016 CLINICAL DATA:  Right-sided thoracentesis EXAM: CHEST 1 VIEW COMPARISON:  CT chest of 06/03/2016 FINDINGS: Much of the right pleural effusion has been evacuated by right  thoracentesis. No pneumothorax is seen. A small left effusion is present with bibasilar atelectasis left-greater-than-right. Cardiomegaly is stable. IMPRESSION: Much of the right pleural effusion has been evacuated by right thoracentesis. No pneumothorax is seen. Electronically Signed   By: Dwyane Dee M.D.   On: 06/04/2016 10:21   Ct Chest Wo Contrast  Result Date: 06/03/2016 CLINICAL DATA:  Pleural effusions.  Dyspnea. EXAM: CT CHEST WITHOUT CONTRAST TECHNIQUE: Multidetector CT imaging of the chest was performed following the standard protocol without IV contrast. COMPARISON:  CT chest without contrast 05/31/2016 FINDINGS: Cardiovascular: The heart size is normal. Dense coronary artery calcifications are present. The small pericardial effusion is stable. The aorta and pulmonary arteries are unremarkable. Mediastinum/Nodes: Subcentimeter mediastinal lymph nodes are stable. No significant axillary adenopathy is present. Lungs/Pleura: Moderate size pleural effusions are similar the prior study. Dependent airspace disease bilaterally is not significantly changed. There is no central obstructing mass. There is a ground-glass attenuation are present as well. High-density material is again noted within the lower lobe bronchi bilaterally Upper Abdomen: Gallstones are present. No significant inflammatory changes are present to suggest cholecystitis. Limited imaging of the abdomen is otherwise unremarkable. Musculoskeletal: Vertebral body heights and alignment are maintained. No focal lytic or blastic lesions are present. IMPRESSION: 1. No significant interval change in moderate bilateral pleural effusions and bibasilar airspace disease. While this likely reflects atelectasis, infection is not excluded. 2. Stable small pericardial effusion. 3. Atherosclerotic disease including dense coronary artery disease. 4. High-density material within the lower lobe bronchi remains concerning for aspiration. Electronically Signed    By: Marin Roberts M.D.   On: 06/03/2016 14:24   US Thoracentesis Asp Pleural Space W/img Guide  Result Date: 06/04/2016 INDICATION: New onset decompensated systolic congestive heart with an ejection fraction of 25% and new bilateral pleural effusions. Request is made for diagnostic and therapeutic thoracentesis. EXAM: ULTRASOUND GUIDED DIAGNOSTIC AND THERAPEUTIC THORACENTESIS MEDICATIONS: 1% lidocaine. COMPLICATIONS: None immediate. PROCEDURE: An ultrasound guided thoracentesis was thoroughly discussed with the patient and questions answered.  The benefits, risks, alternatives and complications were also discussed. The patient understands and wishes to proceed with the procedure. Written consent was obtained. Ultrasound was performed to localize and mark an adequate pocket of fluid in the right chest. The area was then prepped and draped in the normal sterile fashion. 1% Lidocaine was used for local anesthesia. Under ultrasound guidance a Safe-T-Centesis catheter was introduced. Thoracentesis was performed. The catheter was removed and a dressing applied. FINDINGS: A total of approximately 0.8 L of yellow fluid was removed. Samples were sent to the laboratory as requested by the clinical team. IMPRESSION: Successful ultrasound guided right thoracentesis yielding 0.8 L of pleural fluid. Read by: Barnetta Chapel, PA-C Electronically Signed   By: Simonne Come M.D.   On: 06/04/2016 14:12    Cardiac Studies   Echo 06/01/2016:  Study Conclusions - Left ventricle: Septal apical inferior and distal anterior wall hypokinesis The cavity size was severely dilated. Wall thickness was normal. Systolic function was severely reduced. The estimated ejection fraction was in the range of 25% to 30%. Left ventricular diastolic function parameters were normal. - Aortic valve: There was mild regurgitation. - Mitral valve: There was mild regurgitation. - Left atrium: The appendage was moderately to severely  dilated. - Atrial septum: No defect or patent foramen ovale was identified. - Pericardium, extracardiac: Small to moderate posterior lateral pericardial effusion no tamponade Moderate left pleural effusion.   Patient Profile     56 y.o. male with past medical history of Bell's Palsy in 48. He was recently diagnosed with hypothyroid a few months ago with TSH reportedly >90 and his PCP started him on synthroid. He has elevated CK's and transaminase. We have been asked to consult for new LV dysfunction with EF 25-30%.   Assessment & Plan    Acute systolic and diastolic dysfunction. -EF 25-30%, down from 50-55% on 05/22/2016 at [facility]  -CXR: Moderately large bilateral pleural effusions. S/P Rt effusion tap this am-800cc.  -The patient will need right and left cardiac catheterization for further evaluation of new LV dysfunction to assess for ischemic cause of dysfunction. May need a biopsy to investigate infiltrative process.   CAD -Significant coronary calcification noted on CT imaging. -Plan for right and left heart cath once fluid status improved.   Rhabdomyolysis/ polymyositis.  -CPK 5,482--> 4,440. Has been elevated for several weeks. Back up to 5,262.  -Profound muscle weakness and pain -Was given IV fluids until found to have severe LV dysfunction and IV fluids stopped.  Hypothyroidism -TSH was >90 when diagnosed  A month or so ago. Started on synthroid. Was 36.989 on 05/12/2016 -05/31/2016 TSH 15.602 -Managed by IM. Levothyroxine 175 mcg daily  Constipation -with hemorrhoids, better today  Plan: .  I/O is negative 4L plus 800 ml from pleural tap- despite this his wgt has gradually increased to 188lbs- 7 lbs more than admission. He denies SOB and is relativly flat in bed.  Coreg and Aldactone held today secondary to hypotension, will hold Cozaar as well.  Jolene Provost, PA-C  06/05/2016, 10:14 AM   Pager: [phone]    Patient seen and examined. Agree with  assessment and plan. BNP increased at 1155 today.  Aldolase back and increased at 72.6 (nl 3.3- 10.3).  Low BP is limiting med titration. May need steroids with ??possible polymyositis,  recommend rheumatology evaluation.  F/U TSH with thyroid replacement; ? Component of myxedema induced LV  Dysfunction.  Na is low probably secondary to CHF. Continue spironolactone, will reduce coreg  to 3.125 with decreased BP. Losartan on hold today.   Lennette Biharihomas A. Carlethia Mesquita, MD, [facility] 06/05/2016 1:02 PM

## 2016-06-05 NOTE — Procedures (Signed)
   US guided Left thoracentesis  500 cc clear yellow fluid  Tolerated well  cxr pending

## 2016-06-05 NOTE — Progress Notes (Signed)
PROGRESS NOTE  Connor Vargas  ZOX:[id number] DOB: 09/23/1960 DOA: 05/31/2016   PCP: Kaleen Mask, MD   Brief Narrative:  56 y.o. male with recently diagnosed hypothyroidism (few months ago when TSH was reportedly > 90) and his PCP started him on synthroid and the dose increased from initially 50 mcg --> currently 175 mcg but pt continued to decline in terms of overall clinical state despite treatment. He was sent to [facility] hospital about two weeks prior to this admission and was discharged home with same diagnosis of hypothyroidism and ? Myxedema. His medications were not changed and pt was told to follow up with rheumatologist which pt is scheduled to see March 6th. Pt now comes here with progressively worsening weakness, LE and UE swelling, dyspnea exertional and at rest. 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 In addition, pt's says he has not eaten anything in the past two days.   Echo done in the hospital showed new onset sCHF ef 25%, cardiology consulted.   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 No  infiltrates noted on the echo.  - monitor daily weights and strict I/O  - Aldactone started on 06/01/16 - cardiology following- plans [facility] and [facility] once medically more optimized.  - monitor electrolytes    Hyponatremia - likely from fluid overload and hypothyroidism - improving, ~ 128-129 - BMP in AM     Acquired hypothyroidism - TSH is trending down based on record review  - continue synthroid     Rhabdomyolysis  - suspect from his thyroid condition at this time vs polymyositis  - has been elevated for several weeks now per record review, apparently lingering around 5000 - pt has an appointment with rheumatologist on March 6th, 2018 - no IVF due to his cardiac status.  - called [place] rheumatology to discuss further recommendations, steroids vs muscle biopsy     Transaminitis - suspect this is related to the above rhabdo and hypothyroid - hep panel ordered- neg    External hemorrhoids - surgery consulted for assistance     B/L Moderate Pleural effusion - related to cardiac etiology and potentially PM - 780 cc fluid removed from left side and sent for analysis  - pt tolerated well  - asked IR to drain right pleural fluid   DVT prophylaxis: Lovenox SQ Code Status: Full  Family Communication: Wife at bedside. Disposition Plan: TBD, Inpatient rehab once pt medically stable    Consultants:   Cardiology   Surgery   Subjective: Patient continues to report of feeling LE heaviness but little improved from yesterday. No other  complaints otherwise.   Objective: Vitals:   06/04/16 1340 06/04/16 1701 06/04/16 2101 06/05/16 0424  BP: (!) 88/56 (!) 86/53 (!) 97/59 (!) 96/54  Pulse: 78 84 82 87  Resp: 18  18 18   Temp: 98.5 F (36.9 C)  98.2 F (36.8 C) 97.9 F (36.6 C)  TempSrc: Oral  Oral Oral  SpO2: 100%  99% 97%  Weight:    85.5 kg (188 lb 6.4 oz)  Height:        Intake/Output Summary (Last 24 hours) at 06/05/16 1329 Last data filed at 06/04/16 2200  Gross per  24 hour  Intake                0 ml  Output              400 ml  Net             -400 ml   Filed Weights   06/01/16 0513 06/04/16 0406 06/05/16 0424  Weight: 82.3 kg (181 lb 8 oz) 83.1 kg (183 lb 4.8 oz) 85.5 kg (188 lb 6.4 oz)    Examination:  General exam: Appears calm and comfortable  Respiratory system: decreased BS at b/l bases.  Cardiovascular system: S1 & S2 heard, RRR. No JVD, murmurs, rubs, gallops or clicks. No pedal edema. Gastrointestinal system: Abdomen is nondistended, soft and nontender. No organomegaly or masses felt. Normal bowel sounds heard. Central nervous system: Alert and oriented. No focal neurological deficits. Extremities: Symmetric 5 x 5 power. 2+ b/L swellig Skin: No rashes, lesions or ulcers Psychiatry: Judgement and insight appear normal. Mood & affect appropriate.    Data Reviewed:   CBC:  Recent Labs Lab 05/31/16 1219 06/01/16 0437 06/03/16 0242 06/04/16 0343 06/05/16 0345  WBC 10.5 9.5 10.5 8.6 7.2  HGB 12.6* 11.1* 11.1* 10.7* 10.3*  HCT 38.2* 33.4* 33.4* 32.8* 31.7*  MCV 88.0 88.4 88.1 87.7 88.1  PLT 339 275 292 281 261   Basic Metabolic Panel:  Recent Labs Lab 06/01/16 0437 06/02/16 0326 06/03/16 0242 06/04/16 0343 06/05/16 0345  NA 129* 129* 129* 129* 128*  K 4.0 4.1 4.2 4.3 4.4  CL 96* 96* 95* 96* 96*  CO2 24 25 25 24 24   GLUCOSE 84 84 88 83 76  BUN 14 15 15 16 19   CREATININE 0.70 0.77 0.66 0.78 0.91  CALCIUM 8.0* 8.4* 8.3* 8.4* 8.2*  MG  --   --  2.0 1.9 2.0   Liver Function Tests:  Recent Labs Lab 05/31/16 1219 06/01/16 0437 06/02/16 0326  AST 222* 187* 202*  ALT 133* 112* 123*  ALKPHOS 60 47 52  BILITOT 0.9 0.8 0.8  PROT 5.9* 4.9* 5.4*  ALBUMIN 2.9* 2.6* 2.8*   Cardiac Enzymes:  Recent Labs Lab 05/31/16 1309 05/31/16 1715 05/31/16 2212 06/01/16 0437 06/02/16 0326 06/02/16 1247 06/03/16 0242 06/04/16 0343 06/05/16 0345  CKTOTAL 5,482*  --   --  4,440* 5,262* 4,749* 5,560* 5,082* 3,683*  CKMB   --   --   --   --   --  176.0*  --   --   --   TROPONINI 0.59* 0.53* 0.80* 0.61*  --   --   --   --   --    Radiology Studies: Dg Chest 1 View  Result Date: 06/04/2016 CLINICAL DATA:  Right-sided thoracentesis EXAM: CHEST 1 VIEW COMPARISON:  CT chest of 06/03/2016 FINDINGS: Much of the right pleural effusion has been evacuated by right thoracentesis. No pneumothorax is seen.  A small left effusion is present with bibasilar atelectasis left-greater-than-right. Cardiomegaly is stable. IMPRESSION: Much of the right pleural effusion has been evacuated by right thoracentesis. No pneumothorax is seen. Electronically Signed   By: Dwyane DeePaul  Barry M.D.   On: 06/04/2016 10:21   Ct Chest Wo Contrast  Result Date: 06/03/2016 CLINICAL DATA:  Pleural effusions.  Dyspnea. EXAM: CT CHEST WITHOUT CONTRAST TECHNIQUE: Multidetector CT imaging of the chest was performed following the standard protocol without IV contrast. COMPARISON:  CT chest without contrast 05/31/2016 FINDINGS: Cardiovascular: The heart size is normal. Dense coronary artery calcifications are present. The small pericardial effusion is stable. The aorta and pulmonary arteries are unremarkable. Mediastinum/Nodes: Subcentimeter mediastinal lymph nodes are stable. No significant axillary adenopathy is present. Lungs/Pleura: Moderate size pleural effusions are similar the prior study. Dependent airspace disease bilaterally is not significantly changed. There is no central obstructing mass. There is a ground-glass attenuation are present as well. High-density material is again noted within the lower lobe bronchi bilaterally Upper Abdomen: Gallstones are present. No significant inflammatory changes are present to suggest cholecystitis. Limited imaging of the abdomen is otherwise unremarkable. Musculoskeletal: Vertebral body heights and alignment are maintained. No focal lytic or blastic lesions are present. IMPRESSION: 1. No significant interval change in moderate  bilateral pleural effusions and bibasilar airspace disease. While this likely reflects atelectasis, infection is not excluded. 2. Stable small pericardial effusion. 3. Atherosclerotic disease including dense coronary artery disease. 4. High-density material within the lower lobe bronchi remains concerning for aspiration. Electronically Signed   By: Marin Robertshristopher  Mattern M.D.   On: 06/03/2016 14:24   [place] Thoracentesis Asp Pleural Space W/img Guide  Result Date: 06/04/2016 INDICATION: New onset decompensated systolic congestive heart with an ejection fraction of 25% and new bilateral pleural effusions. Request is made for diagnostic and therapeutic thoracentesis. EXAM: ULTRASOUND GUIDED DIAGNOSTIC AND THERAPEUTIC THORACENTESIS MEDICATIONS: 1% lidocaine. COMPLICATIONS: None immediate. PROCEDURE: An ultrasound guided thoracentesis was thoroughly discussed with the patient and questions answered. The benefits, risks, alternatives and complications were also discussed. The patient understands and wishes to proceed with the procedure. Written consent was obtained. Ultrasound was performed to localize and mark an adequate pocket of fluid in the right chest. The area was then prepped and draped in the normal sterile fashion. 1% Lidocaine was used for local anesthesia. Under ultrasound guidance a Safe-T-Centesis catheter was introduced. Thoracentesis was performed. The catheter was removed and a dressing applied. FINDINGS: A total of approximately 0.8 L of yellow fluid was removed. Samples were sent to the laboratory as requested by the clinical team. IMPRESSION: Successful ultrasound guided right thoracentesis yielding 0.8 L of pleural fluid. Read by: Barnetta ChapelKelly Osborne, PA-C Electronically Signed   By: Simonne ComeJohn  Watts M.D.   On: 06/04/2016 14:12    Scheduled Meds: . aspirin EC  81 mg Oral Daily  . carvedilol  3.125 mg Oral BID WC  . enoxaparin (LOVENOX) injection  40 mg Subcutaneous Q24H  . furosemide  40 mg Intravenous Once   . furosemide  20 mg Oral Daily  . hydrocortisone  25 mg Rectal BID  . hydrocortisone-pramoxine  1 applicator Rectal Once  . levothyroxine  175 mcg Oral QAC breakfast  . [START ON 06/06/2016] losartan  25 mg Oral Daily  . sodium chloride flush  3 mL Intravenous Q12H  . spironolactone  12.5 mg Oral BID  . witch hazel-glycerin   Topical TID   Continuous Infusions:   LOS: 5 days   Time spent: 35 mins  Debbora Presto, MD [facility] Pager [phone]  If 7PM-7AM, please contact night-coverage [email or web address] Password [email or web address] 06/05/2016, 1:29 PM

## 2016-06-06 LAB — COMPREHENSIVE METABOLIC PANEL
ALK PHOS: 45 U/L (ref 38–126)
ALT: 98 U/L — ABNORMAL HIGH (ref 17–63)
ANION GAP: 7 (ref 5–15)
AST: 151 U/L — ABNORMAL HIGH (ref 15–41)
Albumin: 2.3 g/dL — ABNORMAL LOW (ref 3.5–5.0)
BUN: 19 mg/dL (ref 6–20)
CALCIUM: 8.1 mg/dL — AB (ref 8.9–10.3)
CO2: 25 mmol/L (ref 22–32)
Chloride: 96 mmol/L — ABNORMAL LOW (ref 101–111)
Creatinine, Ser: 0.85 mg/dL (ref 0.61–1.24)
GFR calc non Af Amer: >60 mL/min (ref >60)
Glucose, Bld: 89 mg/dL (ref 65–99)
POTASSIUM: 4.3 mmol/L (ref 3.5–5.1)
Sodium: 128 mmol/L — ABNORMAL LOW (ref 135–145)
TOTAL PROTEIN: 4.5 g/dL — AB (ref 6.5–8.1)
Total Bilirubin: 0.5 mg/dL (ref 0.3–1.2)

## 2016-06-06 LAB — CBC
HCT: 32.1 % — ABNORMAL LOW (ref 39.0–52.0)
HEMOGLOBIN: 10.4 g/dL — AB (ref 13.0–17.0)
MCH: 28.5 pg (ref 26.0–34.0)
MCHC: 32.4 g/dL (ref 30.0–36.0)
MCV: 87.9 fL (ref 78.0–100.0)
PLATELETS: 241 10*3/uL (ref 150–400)
RBC: 3.65 MIL/uL — ABNORMAL LOW (ref 4.22–5.81)
RDW: 14.8 % (ref 11.5–15.5)
WBC: 7.1 10*3/uL (ref 4.0–10.5)

## 2016-06-06 LAB — CK: Total CK: 3943 U/L — ABNORMAL HIGH (ref 49–397)

## 2016-06-06 MED ORDER — MAGNESIUM HYDROXIDE 400 MG/5ML PO SUSP
30.0000 mL | Freq: Every day | ORAL | Status: DC | PRN
  Administered 2016-06-06 – 2016-06-07 (×2): 30 mL via ORAL
  Filled 2016-06-06 (×2): qty 30

## 2016-06-06 MED ORDER — METHYLPREDNISOLONE SODIUM SUCC 125 MG IJ SOLR
60.0000 mg | Freq: Every day | INTRAMUSCULAR | Status: DC
  Administered 2016-06-06 – 2016-06-10 (×5): 60 mg via INTRAVENOUS
  Filled 2016-06-06 (×5): qty 2

## 2016-06-06 MED ORDER — SPIRONOLACTONE 25 MG PO TABS: 12.5000 mg | ORAL_TABLET | Freq: Every day | ORAL | Status: DC

## 2016-06-06 MED ORDER — LOSARTAN POTASSIUM 25 MG PO TABS: 12.5000 mg | ORAL_TABLET | Freq: Every day | ORAL | Status: DC

## 2016-06-06 NOTE — Progress Notes (Signed)
Progress Note  Patient Name: Connor Vargas Date of Encounter: 06/06/2016  Primary Cardiologist: New- Dr. Tresa Endo  Subjective   No complaints this am, just weak. No SOB, almost flat in bed. Feels like breathing improved after thoracentesis yesterday.   Inpatient Medications    Scheduled Meds: . aspirin EC  81 mg Oral Daily  . carvedilol  3.125 mg Oral BID WC  . enoxaparin (LOVENOX) injection  40 mg Subcutaneous Q24H  . furosemide  40 mg Intravenous Once  . furosemide  20 mg Oral Daily  . hydrocortisone  25 mg Rectal BID  . hydrocortisone-pramoxine  1 applicator Rectal Once  . levothyroxine  175 mcg Oral QAC breakfast  . losartan  25 mg Oral Daily  . sodium chloride flush  3 mL Intravenous Q12H  . spironolactone  12.5 mg Oral BID  . witch hazel-glycerin   Topical TID   Continuous Infusions:  PRN Meds: lactulose, lidocaine, ondansetron **OR** ondansetron (ZOFRAN) IV, oxyCODONE, traZODone   Vital Signs    Vitals:   06/05/16 1508 06/05/16 1525 06/05/16 2227 06/06/16 0520  BP: 103/61 106/67 (!) 96/55 (!) 94/55  Pulse:   81 80  Resp:   18 18  Temp:   98.1 F (36.7 C) 98.5 F (36.9 C)  TempSrc:   Oral Oral  SpO2:   100% 99%  Weight:      Height:        Intake/Output Summary (Last 24 hours) at 06/06/16 0952 Last data filed at 06/05/16 2227  Gross per 24 hour  Intake              360 ml  Output             1600 ml  Net            -1240 ml   Filed Weights   06/04/16 0406 06/05/16 0424  Weight: 183 lb 4.8 oz (83.1 kg) 188 lb 6.4 oz (85.5 kg)    Telemetry    Sinus rhythm - Personally Reviewed  ECG    06/02/2016 Sinus rhythm at 110 bpm, Q waves unchanged from previous - Personally Reviewed  Physical Exam   GEN: No acute distress. Pale Neck: no Jvd Cardiac: RRR, no murmurs, rubs, or gallops.  Respiratory:  basilar crackles Lt 1/3 GI: Soft, nontender, non-distended  MS: 2+ lower leg pitting edema; Non- pitting upper extremity edema. No deformity. Neuro:   Nonfocal  Psych: normal  Labs    Chemistry  Recent Labs Lab 06/01/16 0437 06/02/16 0326  06/04/16 0343 06/05/16 0345 06/06/16 0350  NA 129* 129*  < > 129* 128* 128*  K 4.0 4.1  < > 4.3 4.4 4.3  CL 96* 96*  < > 96* 96* 96*  CO2 24 25  < > 24 24 25   GLUCOSE 84 84  < > 83 76 89  BUN 14 15  < > 16 19 19   CREATININE 0.70 0.77  < > 0.78 0.91 0.85  CALCIUM 8.0* 8.4*  < > 8.4* 8.2* 8.1*  PROT 4.9* 5.4*  --   --   --  4.5*  ALBUMIN 2.6* 2.8*  --   --   --  2.3*  AST 187* 202*  --   --   --  151*  ALT 112* 123*  --   --   --  98*  ALKPHOS 47 52  --   --   --  45  BILITOT 0.8 0.8  --   --   --  0.5  GFRNONAA >60 >60  < > >60 >60 >60  GFRAA >60 >60  < > >60 >60 >60  ANIONGAP 9 8  < > 9 8 7   < > = values in this interval not displayed.   Hematology  Recent Labs Lab 06/04/16 0343 06/05/16 0345 06/06/16 0350  WBC 8.6 7.2 7.1  RBC 3.74* 3.60* 3.65*  HGB 10.7* 10.3* 10.4*  HCT 32.8* 31.7* 32.1*  MCV 87.7 88.1 87.9  MCH 28.6 28.6 28.5  MCHC 32.6 32.5 32.4  RDW 14.8 14.9 14.8  PLT 281 261 241    Cardiac Enzymes  Recent Labs Lab 05/31/16 1309 05/31/16 1715 05/31/16 2212 06/01/16 0437  TROPONINI 0.59* 0.53* 0.80* 0.61*   No results for input(s): TROPIPOC in the last 168 hours.   BNP  Recent Labs Lab 05/31/16 1219 06/02/16 1247 06/05/16 0345  BNP 1,328.2* 1,168.0* 1,155.7*    Lab Results  Component Value Date   TSH 15.602 (H) 05/31/2016   Lipid Panel     Component Value Date/Time   CHOL 147 06/02/2016 0326   TRIG 160 (H) 06/02/2016 0326   HDL 25 (L) 06/02/2016 0326   CHOLHDL 5.9 06/02/2016 0326   VLDL 32 06/02/2016 0326   LDLCALC 90 06/02/2016 0326   DDimer No results for input(s): DDIMER in the last 168 hours.   Radiology    Dg Chest 1 View  Result Date: 06/04/2016 CLINICAL DATA:  Right-sided thoracentesis EXAM: CHEST 1 VIEW COMPARISON:  CT chest of 06/03/2016 FINDINGS: Much of the right pleural effusion has been evacuated by right thoracentesis.  No pneumothorax is seen. A small left effusion is present with bibasilar atelectasis left-greater-than-right. Cardiomegaly is stable. IMPRESSION: Much of the right pleural effusion has been evacuated by right thoracentesis. No pneumothorax is seen. Electronically Signed   By: Dwyane DeePaul  Barry M.D.   On: 06/04/2016 10:21   Ct Chest Wo Contrast  Result Date: 06/03/2016 CLINICAL DATA:  Pleural effusions.  Dyspnea. EXAM: CT CHEST WITHOUT CONTRAST TECHNIQUE: Multidetector CT imaging of the chest was performed following the standard protocol without IV contrast. COMPARISON:  CT chest without contrast 05/31/2016 FINDINGS: Cardiovascular: The heart size is normal. Dense coronary artery calcifications are present. The small pericardial effusion is stable. The aorta and pulmonary arteries are unremarkable. Mediastinum/Nodes: Subcentimeter mediastinal lymph nodes are stable. No significant axillary adenopathy is present. Lungs/Pleura: Moderate size pleural effusions are similar the prior study. Dependent airspace disease bilaterally is not significantly changed. There is no central obstructing mass. There is a ground-glass attenuation are present as well. High-density material is again noted within the lower lobe bronchi bilaterally Upper Abdomen: Gallstones are present. No significant inflammatory changes are present to suggest cholecystitis. Limited imaging of the abdomen is otherwise unremarkable. Musculoskeletal: Vertebral body heights and alignment are maintained. No focal lytic or blastic lesions are present. IMPRESSION: 1. No significant interval change in moderate bilateral pleural effusions and bibasilar airspace disease. While this likely reflects atelectasis, infection is not excluded. 2. Stable small pericardial effusion. 3. Atherosclerotic disease including dense coronary artery disease. 4. High-density material within the lower lobe bronchi remains concerning for aspiration. Electronically Signed   By: Marin Robertshristopher   Mattern M.D.   On: 06/03/2016 14:24   [place] Thoracentesis Asp Pleural Space W/img Guide  Result Date: 06/04/2016 INDICATION: New onset decompensated systolic congestive heart with an ejection fraction of 25% and new bilateral pleural effusions. Request is made for diagnostic and therapeutic thoracentesis. EXAM: ULTRASOUND GUIDED DIAGNOSTIC AND THERAPEUTIC THORACENTESIS MEDICATIONS: 1% lidocaine. COMPLICATIONS:  None immediate. PROCEDURE: An ultrasound guided thoracentesis was thoroughly discussed with the patient and questions answered. The benefits, risks, alternatives and complications were also discussed. The patient understands and wishes to proceed with the procedure. Written consent was obtained. Ultrasound was performed to localize and mark an adequate pocket of fluid in the right chest. The area was then prepped and draped in the normal sterile fashion. 1% Lidocaine was used for local anesthesia. Under ultrasound guidance a Safe-T-Centesis catheter was introduced. Thoracentesis was performed. The catheter was removed and a dressing applied. FINDINGS: A total of approximately 0.8 L of yellow fluid was removed. Samples were sent to the laboratory as requested by the clinical team. IMPRESSION: Successful ultrasound guided right thoracentesis yielding 0.8 L of pleural fluid. Read by: Barnetta Chapel, PA-C Electronically Signed   By: Simonne Come M.D.   On: 06/04/2016 14:12    Cardiac Studies   Echo 06/01/2016:  Study Conclusions - Left ventricle: Septal apical inferior and distal anterior wall hypokinesis The cavity size was severely dilated. Wall thickness was normal. Systolic function was severely reduced. The estimated ejection fraction was in the range of 25% to 30%. Left ventricular diastolic function parameters were normal. - Aortic valve: There was mild regurgitation. - Mitral valve: There was mild regurgitation. - Left atrium: The appendage was moderately to severely dilated. - Atrial  septum: No defect or patent foramen ovale was identified. - Pericardium, extracardiac: Small to moderate posterior lateral pericardial effusion no tamponade Moderate left pleural effusion.   Patient Profile     56 y.o. male with past medical history of Bell's Palsy in 20. He was recently diagnosed with hypothyroid a few months ago with TSH reportedly >90 and his PCP started him on synthroid. He has elevated CK's and transaminase. We have been asked to consult for new LV dysfunction with EF 25-30%.   Assessment & Plan    Acute systolic and diastolic dysfunction. -EF 25-30%, down from 50-55% on 05/22/2016 at [facility]  -CXR: Moderately large bilateral pleural effusions. S/P Rt effusion tap this am-800cc.  -The patient will need right and left cardiac catheterization for further evaluation of new LV dysfunction to assess for ischemic cause of dysfunction. May need a biopsy to investigate infiltrative process.  Continues to be volume overloaded. Continue diuresis.   CAD -Significant coronary calcification noted on CT imaging. -Plan for right and left heart cath once fluid status improved.   Rhabdomyolysis/ polymyositis.  -CPK 5,482--> 4,440. Has been elevated for several weeks. Trended down today.  -Profound muscle weakness and pain -Was given IV fluids until found to have severe LV dysfunction and IV fluids stopped. -steroids started today  Hypothyroidism -TSH was >90 when diagnosed  A month or so ago. Started on synthroid. Was 36.989 on 05/12/2016 -05/31/2016 TSH 15.602 -Managed by IM. Levothyroxine 175 mcg daily  Constipation -with hemorrhoids, better today  Plan: .  I/O is negative 5.3L - despite this his wgt has gradually increased to 188lbs- 7 lbs more than admission. He denies SOB and is relativly flat in bed.  Coreg and Aldactone held today secondary to hypotension, will hold Cozaar as well.  Signed, Will Jorja Loa, MD  06/06/2016, 9:52 AM

## 2016-06-06 NOTE — Progress Notes (Signed)
PROGRESS NOTE  Connor Vargas  ZOX:[id number] DOB: 06-Dec-1960 DOA: 05/31/2016   PCP: Kaleen MaskELKINS,WILSON OLIVER, MD   Brief Narrative:  56 y.o. male with recently diagnosed hypothyroidism (few months ago when TSH was reportedly > 90) and his PCP started him on synthroid and the dose increased from initially 50 mcg --> currently 175 mcg but pt continued to decline in terms of overall clinical state despite treatment. [facility] about two weeks prior to this admission and was discharged home with same diagnosis of hypothyroidism and ? Myxedema. His medications were not changed and pt was told to follow up with rheumatologist which pt is scheduled to see March 6th. Pt now comes here with progressively worsening weakness, LE and UE swelling, dyspnea exertional and at rest. 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 In addition, pt's says he has not eaten anything in the past two days.   Echo done in the hospital showed new onset sCHF ef 25%, cardiology consulted.   Assessment & Plan:   Generalized B/L LE Weakness - multifactorial and secondary to hypothyroidism, rhabdomyolysis, ? 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 No infiltrates noted on the  echo.  - due to hypotension, will stop Losartan and spironolactone for now  - cardiology following- [facility] once medically more optimized. - monitor electrolytes    Hyponatremia - likely from fluid overload and hypothyroidism - improving, ~ 128-129 - BMP in AM     Acquired hypothyroidism - TSH is trending down based on record review  - continue synthroid     Rhabdomyolysis  - suspect from his thyroid condition at this time vs polymyositis  - has been elevated for several weeks now per record review - pt has an appointment with rheumatologist on March 6th, 2018 - no IVF due to his cardiac status.  - CK overall trending down, will continue to monitor     Transaminitis - suspect this is related to the above rhabdo and hypothyroid - hep panel ordered- neg    External hemorrhoids - surgery consulted for assistance     B/L Moderate Pleural effusion - related to cardiac etiology and potentially PM - 780 cc fluid removed from left side and sent for analysis  - 500 cc fluid removed from right side - breathing overall better, fluid analysis with reactive mesangial cells present and likely from acute inflammatory process   DVT prophylaxis: Lovenox SQ Code Status: Full  Family Communication: Wife at bedside. Disposition Plan: TBD, Inpatient rehab once pt medically stable    Consultants:   Cardiology   Surgery   IR for thoracentesis   Subjective: Patient continues to report of feeling LE heaviness but little improved from yesterday. No other  complaints otherwise.   Objective: Vitals:   06/05/16 1525 06/05/16 2227 06/06/16 0520 06/06/16 1425  BP: 106/67 (!) 96/55 (!) 94/55 (!) 84/54  Pulse:  81 80 80  Resp:  18 18 18   Temp:  98.1 F (36.7 C) 98.5 F (36.9 C)   TempSrc:  Oral Oral   SpO2:  100% 99% 100%  Weight:      Height:        Intake/Output Summary (Last 24 hours) at 06/06/16 1439 Last data filed at 06/05/16 2227  Gross per 24 hour  Intake                 0 ml  Output              750 ml  Net             -750 ml   Filed Weights   06/04/16 0406 06/05/16 0424  Weight: 83.1 kg (183 lb 4.8 oz) 85.5 kg (188 lb 6.4 oz)    Examination:  General exam: Appears calm and comfortable  Respiratory system: decreased BS at b/l bases.  Cardiovascular system: S1 & S2 heard, RRR. No JVD, murmurs, rubs, gallops or clicks. No pedal edema. Gastrointestinal system: Abdomen is nondistended, soft and nontender. No organomegaly or masses felt. Normal bowel sounds heard. Central nervous system: Alert and oriented. No focal neurological deficits. Extremities: Symmetric 5 x 5 power. 2+ b/L swellig Skin: No rashes, lesions or ulcers Psychiatry: Judgement and insight appear normal. Mood & affect appropriate.    Data Reviewed:   CBC:  Recent Labs Lab 06/01/16 0437 06/03/16 0242 06/04/16 0343 06/05/16 0345 06/06/16 0350  WBC 9.5 10.5 8.6 7.2 7.1  HGB 11.1* 11.1* 10.7* 10.3* 10.4*  HCT 33.4* 33.4* 32.8* 31.7* 32.1*  MCV 88.4 88.1 87.7 88.1 87.9  PLT 275 292 281 261 241   Basic Metabolic Panel:  Recent Labs Lab 06/02/16 0326 06/03/16 0242 06/04/16 0343 06/05/16 0345 06/06/16 0350  NA 129* 129* 129* 128* 128*  K 4.1 4.2 4.3 4.4 4.3  CL 96* 95* 96* 96* 96*  CO2 25 25 24 24 25   GLUCOSE 84 88 83 76 89  BUN 15 15 16 19 19   CREATININE 0.77 0.66 0.78 0.91 0.85  CALCIUM 8.4* 8.3* 8.4* 8.2* 8.1*  MG  --  2.0 1.9 2.0  --    Liver Function Tests:  Recent Labs Lab 05/31/16 1219 06/01/16 0437 06/02/16 0326 06/06/16 0350  AST 222* 187* 202* 151*  ALT 133* 112* 123* 98*  ALKPHOS 60 47 52 45  BILITOT 0.9 0.8 0.8 0.5  PROT 5.9* 4.9* 5.4* 4.5*  ALBUMIN 2.9* 2.6* 2.8* 2.3*   Cardiac Enzymes:  Recent Labs Lab 05/31/16 1309 05/31/16 1715 05/31/16 2212 06/01/16 0437  06/02/16 1247 06/03/16 0242 06/04/16 0343 06/05/16 0345 06/06/16 0350  CKTOTAL 5,482*  --   --  4,440*  < > 4,749* 5,560* 5,082* 3,683* 3,943*  CKMB  --   --    --   --   --  176.0*  --   --   --   --   TROPONINI 0.59* 0.53* 0.80* 0.61*  --   --   --   --   --   --   < > = values in this interval not displayed.   Radiology Studies: Dg Chest 1 View  Result Date: 06/05/2016 CLINICAL DATA:  Followup left thoracentesis EXAM: CHEST 1 VIEW COMPARISON:  06/04/2016 FINDINGS: No pneumothorax. There is probably less pleural  fluid on the left. Small effusions persist with basilar atelectasis. There is venous hypertension with interstitial edema. IMPRESSION: Less pleural fluid on the left. No pneumothorax following thoracentesis. Persistent basilar atelectasis and mild edema. Electronically Signed   By: Paulina Fusi M.D.   On: 06/05/2016 16:20   US Thoracentesis Asp Pleural Space W/img Guide  Result Date: 06/05/2016 INDICATION: Symptomatic left sided pleural effusion EXAM: US THORACENTESIS ASP PLEURAL SPACE W/IMG GUIDE COMPARISON:  None. MEDICATIONS: 10 cc 1% lidocaine COMPLICATIONS: None immediate. TECHNIQUE: Informed written consent was obtained from the patient after a discussion of the risks, benefits and alternatives to treatment. A timeout was performed prior to the initiation of the procedure. Initial ultrasound scanning demonstrates a left pleural effusion. The lower chest was prepped and draped in the usual sterile fashion. 1% lidocaine was used for local anesthesia. Under direct ultrasound guidance, a 19 gauge, 7-cm, Yueh catheter was introduced. An ultrasound image was saved for documentation purposes. The thoracentesis was performed. The catheter was removed and a dressing was applied. The patient tolerated the procedure well without immediate post procedural complication. The patient was escorted to have an upright chest radiograph. FINDINGS: A total of approximately 500 cc of clear yellow fluid was removed. IMPRESSION: Successful ultrasound-guided left sided thoracentesis yielding 0.5 liters of pleural fluid. Read by Robet Leu [facility] Electronically Signed    By: Corlis Leak M.D.   On: 06/05/2016 15:48    Scheduled Meds: . aspirin EC  81 mg Oral Daily  . carvedilol  3.125 mg Oral BID WC  . enoxaparin (LOVENOX) injection  40 mg Subcutaneous Q24H  . furosemide  40 mg Intravenous Once  . furosemide  20 mg Oral Daily  . hydrocortisone  25 mg Rectal BID  . hydrocortisone-pramoxine  1 applicator Rectal Once  . levothyroxine  175 mcg Oral QAC breakfast  . methylPREDNISolone (SOLU-MEDROL) injection  60 mg Intravenous Daily  . sodium chloride flush  3 mL Intravenous Q12H  . witch hazel-glycerin   Topical TID   Continuous Infusions:   LOS: 6 days   Time spent: 35 mins   Debbora Presto, MD [facility] Pager [phone]  If 7PM-7AM, please contact night-coverage [email or web address] Password [facility] 06/06/2016, 2:39 PM

## 2016-06-07 LAB — COMPREHENSIVE METABOLIC PANEL
ALBUMIN: 2.4 g/dL — AB (ref 3.5–5.0)
ALK PHOS: 48 U/L (ref 38–126)
ALT: 95 U/L — ABNORMAL HIGH (ref 17–63)
ANION GAP: 7 (ref 5–15)
AST: 124 U/L — ABNORMAL HIGH (ref 15–41)
BILIRUBIN TOTAL: 0.4 mg/dL (ref 0.3–1.2)
BUN: 19 mg/dL (ref 6–20)
CALCIUM: 8.3 mg/dL — AB (ref 8.9–10.3)
CO2: 24 mmol/L (ref 22–32)
Chloride: 98 mmol/L — ABNORMAL LOW (ref 101–111)
Creatinine, Ser: 0.65 mg/dL (ref 0.61–1.24)
GFR calc Af Amer: >60 mL/min (ref >60)
GFR calc non Af Amer: >60 mL/min (ref >60)
Glucose, Bld: 110 mg/dL — ABNORMAL HIGH (ref 65–99)
POTASSIUM: 4.9 mmol/L (ref 3.5–5.1)
SODIUM: 129 mmol/L — AB (ref 135–145)
TOTAL PROTEIN: 4.8 g/dL — AB (ref 6.5–8.1)

## 2016-06-07 LAB — CK: Total CK: 2994 U/L — ABNORMAL HIGH (ref 49–397)

## 2016-06-07 NOTE — Progress Notes (Signed)
PROGRESS NOTE  Connor Vargas  ZOX:[id number] DOB: 1960/08/18 DOA: 05/31/2016   PCP: Kaleen Mask, MD   Brief Narrative:  56 y.o. male with recently diagnosed hypothyroidism (few months ago when TSH was reportedly > 90) and his PCP started him on synthroid and the dose increased from initially 50 mcg --> currently 175 mcg but pt continued to decline in terms of overall clinical state despite treatment. He was sent to [facility] hospital about two weeks prior to this admission and was discharged home with same diagnosis of hypothyroidism and ? Myxedema. His medications were not changed and pt was told to follow up with rheumatologist which pt is scheduled to see March 6th. Pt now comes here with progressively worsening weakness, LE and UE swelling, dyspnea exertional and at rest. 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 In addition, pt's says he has not eaten anything in the past two days.   Echo done in the hospital showed new onset sCHF ef 25%, cardiology consulted.   Assessment & Plan:   Generalized B/L LE Weakness - multifactorial and secondary to hypothyroidism, rhabdomyolysis, ? 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 No infiltrates noted on the  echo.  - due to hypotension, I have stopped Losartan and spironolactone for now  - cardiology following- plans for [facility] and [facility] once medically more optimized and pt is in agreement with holding off on any intervention until his overall strength improves  - monitor electrolytes    Hyponatremia - likely from fluid overload and hypothyroidism - improving, ~ 128-129 - BMP in AM     Acquired hypothyroidism - TSH is trending down based on record review  - continue synthroid     Rhabdomyolysis  - suspect from his thyroid condition at this time vs polymyositis  - anti Alvino Chapel - ab > 8 (high) and with elevated aldolase and CK levels this is highly suggestive of polymyositis  - pt has an appointment with rheumatologist on March 6th, 2018 with Dr Dorathy Kinsman  - no IVF due to his cardiac status.  - CK overall trending down - continue IV steroids day #2, plan to continue until Monday and transition to oral Prednisone with prolonged taper     Transaminitis - suspect this is related to the above rhabdo and hypothyroid - trending down  - hep panel ordered- neg    External hemorrhoids - surgery consulted for assistance     B/L Moderate Pleural effusion - related to cardiac etiology and potentially PM - 780 cc fluid removed from left side and sent for analysis  - 500 cc fluid removed from right side - breathing overall better, fluid analysis with reactive mesangial cells present and likely from acute inflammatory process   DVT prophylaxis: Lovenox  SQ Code Status: Full  Family Communication: Wife at bedside. Disposition Plan: TBD, Inpatient rehab once pt medically stable    Consultants:   Cardiology   Surgery   IR for thoracentesis   Subjective: Patient continues to report of feeling LE heaviness but improved from yesterday. No other complaints otherwise.   Objective: Vitals:   06/06/16 0520 06/06/16 1425 06/06/16 1931 06/07/16 0553  BP: (!) 94/55 (!) 84/54 102/64 (!) 98/59  Pulse: 80  80 84 76  Resp: 18 18 16 16   Temp: 98.5 F (36.9 C)  98.5 F (36.9 C) 98.2 F (36.8 C)  TempSrc: Oral  Axillary Oral  SpO2: 99% 100% 97% 99%  Weight:    82.1 kg (181 lb)  Height:        Intake/Output Summary (Last 24 hours) at 06/07/16 1436 Last data filed at 06/07/16 1000  Gross per 24 hour  Intake                0 ml  Output             1250 ml  Net            -1250 ml   Filed Weights   06/05/16 0424 06/07/16 0553  Weight: 85.5 kg (188 lb 6.4 oz) 82.1 kg (181 lb)    Examination:  General exam: Appears calm and comfortable  Respiratory system: decreased BS at b/l bases.  Cardiovascular system: S1 & S2 heard, RRR. No JVD, murmurs, rubs, gallops or clicks. Gastrointestinal system: Abdomen is nondistended, soft and nontender. No organomegaly or masses felt. Normal bowel sounds heard. Central nervous system: Alert and oriented. No focal neurological deficits. Extremities: Symmetric 5 x 5 power. 2+ b/L swellig Skin: No rashes, lesions or ulcers Psychiatry: Judgement and insight appear normal. Mood & affect appropriate.    Data Reviewed:   CBC:  Recent Labs Lab 06/01/16 0437 06/03/16 0242 06/04/16 0343 06/05/16 0345 06/06/16 0350  WBC 9.5 10.5 8.6 7.2 7.1  HGB 11.1* 11.1* 10.7* 10.3* 10.4*  HCT 33.4* 33.4* 32.8* 31.7* 32.1*  MCV 88.4 88.1 87.7 88.1 87.9  PLT 275 292 281 261 241   Basic Metabolic Panel:  Recent Labs Lab 06/03/16 0242 06/04/16 0343 06/05/16 0345 06/06/16 0350 06/07/16 0500  NA 129* 129* 128* 128* 129*  K 4.2 4.3 4.4 4.3 4.9  CL 95* 96* 96* 96* 98*  CO2 25 24 24 25 24   GLUCOSE 88 83 76 89 110*  BUN 15 16 19 19 19   CREATININE 0.66 0.78 0.91 0.85 0.65  CALCIUM 8.3* 8.4* 8.2* 8.1* 8.3*  MG 2.0 1.9 2.0  --   --    Liver Function Tests:  Recent Labs Lab 06/01/16 0437 06/02/16 0326 06/06/16 0350 06/07/16 0500  AST 187* 202* 151* 124*  ALT 112* 123* 98* 95*  ALKPHOS 47 52 45 48  BILITOT 0.8 0.8 0.5 0.4  PROT 4.9* 5.4* 4.5* 4.8*    ALBUMIN 2.6* 2.8* 2.3* 2.4*   Cardiac Enzymes:  Recent Labs Lab 05/31/16 1715 05/31/16 2212 06/01/16 0437  06/02/16 1247 06/03/16 0242 06/04/16 0343 06/05/16 0345 06/06/16 0350 06/07/16 0500  CKTOTAL  --   --  4,440*  < > 4,749* 5,560* 5,082* 3,683* 3,943* 2,994*  CKMB  --   --   --   --  176.0*  --   --   --   --   --   TROPONINI 0.53* 0.80* 0.61*  --   --   --   --   --   --   --   < > =  values in this interval not displayed.   Radiology Studies: Dg Chest 1 View  Result Date: 06/05/2016 CLINICAL DATA:  Followup left thoracentesis EXAM: CHEST 1 VIEW COMPARISON:  06/04/2016 FINDINGS: No pneumothorax. There is probably less pleural fluid on the left. Small effusions persist with basilar atelectasis. There is venous hypertension with interstitial edema. IMPRESSION: Less pleural fluid on the left. No pneumothorax following thoracentesis. Persistent basilar atelectasis and mild edema. Electronically Signed   By: Paulina Fusi M.D.   On: 06/05/2016 16:20   US Thoracentesis Asp Pleural Space W/img Guide  Result Date: 06/05/2016 INDICATION: Symptomatic left sided pleural effusion EXAM: US THORACENTESIS ASP PLEURAL SPACE W/IMG GUIDE COMPARISON:  None. MEDICATIONS: 10 cc 1% lidocaine COMPLICATIONS: None immediate. TECHNIQUE: Informed written consent was obtained from the patient after a discussion of the risks, benefits and alternatives to treatment. A timeout was performed prior to the initiation of the procedure. Initial ultrasound scanning demonstrates a left pleural effusion. The lower chest was prepped and draped in the usual sterile fashion. 1% lidocaine was used for local anesthesia. Under direct ultrasound guidance, a 19 gauge, 7-cm, Yueh catheter was introduced. An ultrasound image was saved for documentation purposes. The thoracentesis was performed. The catheter was removed and a dressing was applied. The patient tolerated the procedure well without immediate post procedural complication.  The patient was escorted to have an upright chest radiograph. FINDINGS: A total of approximately 500 cc of clear yellow fluid was removed. IMPRESSION: Successful ultrasound-guided left sided thoracentesis yielding 0.5 liters of pleural fluid. Read by Robet Leu [facility] Electronically Signed   By: Corlis Leak M.D.   On: 06/05/2016 15:48    Scheduled Meds: . aspirin EC  81 mg Oral Daily  . carvedilol  3.125 mg Oral BID WC  . enoxaparin (LOVENOX) injection  40 mg Subcutaneous Q24H  . furosemide  20 mg Oral Daily  . hydrocortisone  25 mg Rectal BID  . hydrocortisone-pramoxine  1 applicator Rectal Once  . levothyroxine  175 mcg Oral QAC breakfast  . methylPREDNISolone (SOLU-MEDROL) injection  60 mg Intravenous Daily  . sodium chloride flush  3 mL Intravenous Q12H  . witch hazel-glycerin   Topical TID   Continuous Infusions:   LOS: 7 days   Time spent: 35 mins   Debbora Presto, MD [facility] Pager [phone]  If 7PM-7AM, please contact night-coverage [email or web address] Password [facility] 06/07/2016, 2:36 PM

## 2016-06-07 NOTE — Progress Notes (Signed)
Progress Note  Patient Name: Connor Vargas Date of Encounter: 06/07/2016  Primary Cardiologist: New- Dr. Tresa EndoKelly  Subjective   Steroids started yesterday. Feeling much improved but remains weak.  Inpatient Medications    Scheduled Meds: . aspirin EC  81 mg Oral Daily  . carvedilol  3.125 mg Oral BID WC  . enoxaparin (LOVENOX) injection  40 mg Subcutaneous Q24H  . furosemide  20 mg Oral Daily  . hydrocortisone  25 mg Rectal BID  . hydrocortisone-pramoxine  1 applicator Rectal Once  . levothyroxine  175 mcg Oral QAC breakfast  . methylPREDNISolone (SOLU-MEDROL) injection  60 mg Intravenous Daily  . sodium chloride flush  3 mL Intravenous Q12H  . witch hazel-glycerin   Topical TID   Continuous Infusions:  PRN Meds: lidocaine, magnesium hydroxide, ondansetron **OR** ondansetron (ZOFRAN) IV, oxyCODONE, traZODone   Vital Signs    Vitals:   06/06/16 0520 06/06/16 1425 06/06/16 1931 06/07/16 0553  BP: (!) 94/55 (!) 84/54 102/64 (!) 98/59  Pulse: 80 80 84 76  Resp: 18 18 16 16   Temp: 98.5 F (36.9 C)  98.5 F (36.9 C) 98.2 F (36.8 C)  TempSrc: Oral  Axillary Oral  SpO2: 99% 100% 97% 99%  Weight:    181 lb (82.1 kg)  Height:        Intake/Output Summary (Last 24 hours) at 06/07/16 1043 Last data filed at 06/07/16 0556  Gross per 24 hour  Intake                0 ml  Output             1100 ml  Net            -1100 ml   Filed Weights   06/05/16 0424 06/07/16 0553  Weight: 188 lb 6.4 oz (85.5 kg) 181 lb (82.1 kg)    Telemetry    Sinus rhythm - Personally Reviewed  ECG    06/02/2016 Sinus rhythm at 110 bpm, Q waves unchanged from previous - Personally Reviewed  Physical Exam   GEN: No acute distress. Pale Neck: no Jvd Cardiac: RRR, no murmurs, rubs, or gallops.  Respiratory:  basilar crackles Lt 1/3 GI: Soft, nontender, non-distended  MS: 2+ lower leg pitting edema; Non- pitting upper extremity edema. No deformity. Neuro:  Nonfocal  Psych: normal  Labs      Chemistry  Recent Labs Lab 06/02/16 0326  06/05/16 0345 06/06/16 0350 06/07/16 0500  NA 129*  < > 128* 128* 129*  K 4.1  < > 4.4 4.3 4.9  CL 96*  < > 96* 96* 98*  CO2 25  < > 24 25 24   GLUCOSE 84  < > 76 89 110*  BUN 15  < > 19 19 19   CREATININE 0.77  < > 0.91 0.85 0.65  CALCIUM 8.4*  < > 8.2* 8.1* 8.3*  PROT 5.4*  --   --  4.5* 4.8*  ALBUMIN 2.8*  --   --  2.3* 2.4*  AST 202*  --   --  151* 124*  ALT 123*  --   --  98* 95*  ALKPHOS 52  --   --  45 48  BILITOT 0.8  --   --  0.5 0.4  GFRNONAA >60  < > >60 >60 >60  GFRAA >60  < > >60 >60 >60  ANIONGAP 8  < > 8 7 7   < > = values in this interval not displayed.   Hematology  Recent Labs Lab 06/04/16 0343 06/05/16 0345 06/06/16 0350  WBC 8.6 7.2 7.1  RBC 3.74* 3.60* 3.65*  HGB 10.7* 10.3* 10.4*  HCT 32.8* 31.7* 32.1*  MCV 87.7 88.1 87.9  MCH 28.6 28.6 28.5  MCHC 32.6 32.5 32.4  RDW 14.8 14.9 14.8  PLT 281 261 241    Cardiac Enzymes  Recent Labs Lab 05/31/16 1309 05/31/16 1715 05/31/16 2212 06/01/16 0437  TROPONINI 0.59* 0.53* 0.80* 0.61*   No results for input(s): TROPIPOC in the last 168 hours.   BNP  Recent Labs Lab 05/31/16 1219 06/02/16 1247 06/05/16 0345  BNP 1,328.2* 1,168.0* 1,155.7*    Lab Results  Component Value Date   TSH 15.602 (H) 05/31/2016   Lipid Panel     Component Value Date/Time   CHOL 147 06/02/2016 0326   TRIG 160 (H) 06/02/2016 0326   HDL 25 (L) 06/02/2016 0326   CHOLHDL 5.9 06/02/2016 0326   VLDL 32 06/02/2016 0326   LDLCALC 90 06/02/2016 0326   DDimer No results for input(s): DDIMER in the last 168 hours.   Radiology    Dg Chest 1 View  Result Date: 06/04/2016 CLINICAL DATA:  Right-sided thoracentesis EXAM: CHEST 1 VIEW COMPARISON:  CT chest of 06/03/2016 FINDINGS: Much of the right pleural effusion has been evacuated by right thoracentesis. No pneumothorax is seen. A small left effusion is present with bibasilar atelectasis left-greater-than-right.  Cardiomegaly is stable. IMPRESSION: Much of the right pleural effusion has been evacuated by right thoracentesis. No pneumothorax is seen. Electronically Signed   By: Dwyane Dee M.D.   On: 06/04/2016 10:21   Ct Chest Wo Contrast  Result Date: 06/03/2016 CLINICAL DATA:  Pleural effusions.  Dyspnea. EXAM: CT CHEST WITHOUT CONTRAST TECHNIQUE: Multidetector CT imaging of the chest was performed following the standard protocol without IV contrast. COMPARISON:  CT chest without contrast 05/31/2016 FINDINGS: Cardiovascular: The heart size is normal. Dense coronary artery calcifications are present. The small pericardial effusion is stable. The aorta and pulmonary arteries are unremarkable. Mediastinum/Nodes: Subcentimeter mediastinal lymph nodes are stable. No significant axillary adenopathy is present. Lungs/Pleura: Moderate size pleural effusions are similar the prior study. Dependent airspace disease bilaterally is not significantly changed. There is no central obstructing mass. There is a ground-glass attenuation are present as well. High-density material is again noted within the lower lobe bronchi bilaterally Upper Abdomen: Gallstones are present. No significant inflammatory changes are present to suggest cholecystitis. Limited imaging of the abdomen is otherwise unremarkable. Musculoskeletal: Vertebral body heights and alignment are maintained. No focal lytic or blastic lesions are present. IMPRESSION: 1. No significant interval change in moderate bilateral pleural effusions and bibasilar airspace disease. While this likely reflects atelectasis, infection is not excluded. 2. Stable small pericardial effusion. 3. Atherosclerotic disease including dense coronary artery disease. 4. High-density material within the lower lobe bronchi remains concerning for aspiration. Electronically Signed   By: Marin Roberts M.D.   On: 06/03/2016 14:24   US Thoracentesis Asp Pleural Space W/img Guide  Result Date:  06/04/2016 INDICATION: New onset decompensated systolic congestive heart with an ejection fraction of 25% and new bilateral pleural effusions. Request is made for diagnostic and therapeutic thoracentesis. EXAM: ULTRASOUND GUIDED DIAGNOSTIC AND THERAPEUTIC THORACENTESIS MEDICATIONS: 1% lidocaine. COMPLICATIONS: None immediate. PROCEDURE: An ultrasound guided thoracentesis was thoroughly discussed with the patient and questions answered. The benefits, risks, alternatives and complications were also discussed. The patient understands and wishes to proceed with the procedure. Written consent was obtained. Ultrasound was performed to localize and  mark an adequate pocket of fluid in the right chest. The area was then prepped and draped in the normal sterile fashion. 1% Lidocaine was used for local anesthesia. Under ultrasound guidance a Safe-T-Centesis catheter was introduced. Thoracentesis was performed. The catheter was removed and a dressing applied. FINDINGS: A total of approximately 0.8 L of yellow fluid was removed. Samples were sent to the laboratory as requested by the clinical team. IMPRESSION: Successful ultrasound guided right thoracentesis yielding 0.8 L of pleural fluid. Read by: Barnetta Chapel, PA-C Electronically Signed   By: Simonne Come M.D.   On: 06/04/2016 14:12    Cardiac Studies   Echo 06/01/2016:  Study Conclusions - Left ventricle: Septal apical inferior and distal anterior wall hypokinesis The cavity size was severely dilated. Wall thickness was normal. Systolic function was severely reduced. The estimated ejection fraction was in the range of 25% to 30%. Left ventricular diastolic function parameters were normal. - Aortic valve: There was mild regurgitation. - Mitral valve: There was mild regurgitation. - Left atrium: The appendage was moderately to severely dilated. - Atrial septum: No defect or patent foramen ovale was identified. - Pericardium, extracardiac: Small to  moderate posterior lateral pericardial effusion no tamponade Moderate left pleural effusion.   Patient Profile     56 y.o. male with past medical history of Bell's Palsy in 73. He was recently diagnosed with hypothyroid a few months ago with TSH reportedly >90 and his PCP started him on synthroid. He has elevated CK's and transaminase. We have been asked to consult for new LV dysfunction with EF 25-30%.   Assessment & Plan    Acute systolic and diastolic dysfunction. -EF 25-30%, down from 50-55% on 05/22/2016 at [facility]  -CXR: Moderately large bilateral pleural effusions. S/P Rt effusion tap this am-800cc.  -The patient Odetta Forness need right and left cardiac catheterization for further evaluation of new LV dysfunction to assess for ischemic cause of dysfunction. May need a biopsy to investigate infiltrative process. Still has LE edema, would prefer to cath once edema significantly reduced. Continues to be volume overloaded. Continue diuresis. Out 6.4L net  CAD -Significant coronary calcification noted on CT imaging. -Plan for right and left heart cath once fluid status improved.   Rhabdomyolysis/ polymyositis.  -CPK 5,482--> 4,440. Has been elevated for several weeks. Trended down today.  -Profound muscle weakness and pain -Was given IV fluids until found to have severe LV dysfunction and IV fluids stopped. -steroids started today  Hypothyroidism -TSH was >90 when diagnosed  A month or so ago. Started on synthroid. Was 36.989 on 05/12/2016 -05/31/2016 TSH 15.602 -Managed by IM. Levothyroxine 175 mcg daily  Constipation -with hemorrhoids, better today   Signed, Annali Lybrand Jorja Loa, MD  06/07/2016, 10:43 AM

## 2016-06-08 LAB — COMPREHENSIVE METABOLIC PANEL
ALBUMIN: 2.7 g/dL — AB (ref 3.5–5.0)
ALT: 109 U/L — ABNORMAL HIGH (ref 17–63)
AST: 129 U/L — AB (ref 15–41)
Alkaline Phosphatase: 50 U/L (ref 38–126)
Anion gap: 8 (ref 5–15)
BUN: 18 mg/dL (ref 6–20)
CHLORIDE: 96 mmol/L — AB (ref 101–111)
CO2: 25 mmol/L (ref 22–32)
Calcium: 8.4 mg/dL — ABNORMAL LOW (ref 8.9–10.3)
Creatinine, Ser: 0.59 mg/dL — ABNORMAL LOW (ref 0.61–1.24)
GFR calc Af Amer: >60 mL/min (ref >60)
GFR calc non Af Amer: >60 mL/min (ref >60)
GLUCOSE: 105 mg/dL — AB (ref 65–99)
POTASSIUM: 4.7 mmol/L (ref 3.5–5.1)
SODIUM: 129 mmol/L — AB (ref 135–145)
Total Bilirubin: 0.4 mg/dL (ref 0.3–1.2)
Total Protein: 5.2 g/dL — ABNORMAL LOW (ref 6.5–8.1)

## 2016-06-08 LAB — CK: CK TOTAL: 2716 U/L — AB (ref 49–397)

## 2016-06-08 MED ORDER — FUROSEMIDE 10 MG/ML IJ SOLN
40.0000 mg | Freq: Two times a day (BID) | INTRAMUSCULAR | Status: DC
  Administered 2016-06-08 – 2016-06-10 (×5): 40 mg via INTRAVENOUS
  Filled 2016-06-08 (×5): qty 4

## 2016-06-08 NOTE — Progress Notes (Signed)
Progress Note  Patient Name: Connor Vargas Date of Encounter: 06/08/2016  Primary Cardiologist: New- Dr. Tresa EndoKelly  Subjective   Steroids started Saturday 3/3. Feeling much improved but remains weak, Could not sit up on his own. His right heel is bothering him.  Inpatient Medications    Scheduled Meds: . aspirin EC  81 mg Oral Daily  . carvedilol  3.125 mg Oral BID WC  . enoxaparin (LOVENOX) injection  40 mg Subcutaneous Q24H  . furosemide  20 mg Oral Daily  . hydrocortisone  25 mg Rectal BID  . hydrocortisone-pramoxine  1 applicator Rectal Once  . levothyroxine  175 mcg Oral QAC breakfast  . methylPREDNISolone (SOLU-MEDROL) injection  60 mg Intravenous Daily  . sodium chloride flush  3 mL Intravenous Q12H  . witch hazel-glycerin   Topical TID   Continuous Infusions:  PRN Meds: lidocaine, magnesium hydroxide, ondansetron **OR** ondansetron (ZOFRAN) IV, oxyCODONE, traZODone   Vital Signs    Vitals:   06/07/16 0553 06/07/16 1611 06/07/16 2033 06/08/16 0459  BP: (!) 98/59 102/62 111/64 111/64  Pulse: 76 77 75 75  Resp: 16 18 20 18   Temp: 98.2 F (36.8 C)  98.3 F (36.8 C) 97.5 F (36.4 C)  TempSrc: Oral  Oral Oral  SpO2: 99% 100% 99% 98%  Weight: 181 lb (82.1 kg)     Height:        Intake/Output Summary (Last 24 hours) at 06/08/16 0930 Last data filed at 06/07/16 2039  Gross per 24 hour  Intake                0 ml  Output              325 ml  Net             -325 ml   Filed Weights   06/07/16 0553  Weight: 181 lb (82.1 kg)    Telemetry    Sinus rhythm - Personally Reviewed  ECG    06/02/2016 Sinus rhythm at 110 bpm, Q waves unchanged from previous - Personally Reviewed  Physical Exam   GEN: No acute distress. Pale, Dry skin appearance Neck: no Jvd Cardiac: RRR, no murmurs, rubs, or gallops.  Respiratory:  basilar crackles Lt 1/3 GI: Soft, nontender, non-distended  MS: 2-3+ lower leg pitting edema; Ace wraps in place, right heel with discomfort Non-  pitting upper extremity edema. No deformity. Very weakened, could not sit up on his own without help Neuro:  Nonfocal  Psych: normal  Labs    Chemistry  Recent Labs Lab 06/06/16 0350 06/07/16 0500 06/08/16 0200  NA 128* 129* 129*  K 4.3 4.9 4.7  CL 96* 98* 96*  CO2 25 24 25   GLUCOSE 89 110* 105*  BUN 19 19 18   CREATININE 0.85 0.65 0.59*  CALCIUM 8.1* 8.3* 8.4*  PROT 4.5* 4.8* 5.2*  ALBUMIN 2.3* 2.4* 2.7*  AST 151* 124* 129*  ALT 98* 95* 109*  ALKPHOS 45 48 50  BILITOT 0.5 0.4 0.4  GFRNONAA >60 >60 >60  GFRAA >60 >60 >60  ANIONGAP 7 7 8      Hematology  Recent Labs Lab 06/04/16 0343 06/05/16 0345 06/06/16 0350  WBC 8.6 7.2 7.1  RBC 3.74* 3.60* 3.65*  HGB 10.7* 10.3* 10.4*  HCT 32.8* 31.7* 32.1*  MCV 87.7 88.1 87.9  MCH 28.6 28.6 28.5  MCHC 32.6 32.5 32.4  RDW 14.8 14.9 14.8  PLT 281 261 241    Cardiac Enzymes No results for input(s):  TROPONINI in the last 168 hours. No results for input(s): TROPIPOC in the last 168 hours.   BNP  Recent Labs Lab 06/02/16 1247 06/05/16 0345  BNP 1,168.0* 1,155.7*    Lab Results  Component Value Date   TSH 15.602 (H) 05/31/2016   Lipid Panel     Component Value Date/Time   CHOL 147 06/02/2016 0326   TRIG 160 (H) 06/02/2016 0326   HDL 25 (L) 06/02/2016 0326   CHOLHDL 5.9 06/02/2016 0326   VLDL 32 06/02/2016 0326   LDLCALC 90 06/02/2016 0326   DDimer No results for input(s): DDIMER in the last 168 hours.   Radiology    Dg Chest 1 View  Result Date: 06/04/2016 CLINICAL DATA:  Right-sided thoracentesis EXAM: CHEST 1 VIEW COMPARISON:  CT chest of 06/03/2016 FINDINGS: Much of the right pleural effusion has been evacuated by right thoracentesis. No pneumothorax is seen. A small left effusion is present with bibasilar atelectasis left-greater-than-right. Cardiomegaly is stable. IMPRESSION: Much of the right pleural effusion has been evacuated by right thoracentesis. No pneumothorax is seen. Electronically Signed    By: Dwyane Dee M.D.   On: 06/04/2016 10:21   Ct Chest Wo Contrast  Result Date: 06/03/2016 CLINICAL DATA:  Pleural effusions.  Dyspnea. EXAM: CT CHEST WITHOUT CONTRAST TECHNIQUE: Multidetector CT imaging of the chest was performed following the standard protocol without IV contrast. COMPARISON:  CT chest without contrast 05/31/2016 FINDINGS: Cardiovascular: The heart size is normal. Dense coronary artery calcifications are present. The small pericardial effusion is stable. The aorta and pulmonary arteries are unremarkable. Mediastinum/Nodes: Subcentimeter mediastinal lymph nodes are stable. No significant axillary adenopathy is present. Lungs/Pleura: Moderate size pleural effusions are similar the prior study. Dependent airspace disease bilaterally is not significantly changed. There is no central obstructing mass. There is a ground-glass attenuation are present as well. High-density material is again noted within the lower lobe bronchi bilaterally Upper Abdomen: Gallstones are present. No significant inflammatory changes are present to suggest cholecystitis. Limited imaging of the abdomen is otherwise unremarkable. Musculoskeletal: Vertebral body heights and alignment are maintained. No focal lytic or blastic lesions are present. IMPRESSION: 1. No significant interval change in moderate bilateral pleural effusions and bibasilar airspace disease. While this likely reflects atelectasis, infection is not excluded. 2. Stable small pericardial effusion. 3. Atherosclerotic disease including dense coronary artery disease. 4. High-density material within the lower lobe bronchi remains concerning for aspiration. Electronically Signed   By: Marin Roberts M.D.   On: 06/03/2016 14:24   US Thoracentesis Asp Pleural Space W/img Guide  Result Date: 06/04/2016 INDICATION: New onset decompensated systolic congestive heart with an ejection fraction of 25% and new bilateral pleural effusions. Request is made for  diagnostic and therapeutic thoracentesis. EXAM: ULTRASOUND GUIDED DIAGNOSTIC AND THERAPEUTIC THORACENTESIS MEDICATIONS: 1% lidocaine. COMPLICATIONS: None immediate. PROCEDURE: An ultrasound guided thoracentesis was thoroughly discussed with the patient and questions answered. The benefits, risks, alternatives and complications were also discussed. The patient understands and wishes to proceed with the procedure. Written consent was obtained. Ultrasound was performed to localize and mark an adequate pocket of fluid in the right chest. The area was then prepped and draped in the normal sterile fashion. 1% Lidocaine was used for local anesthesia. Under ultrasound guidance a Safe-T-Centesis catheter was introduced. Thoracentesis was performed. The catheter was removed and a dressing applied. FINDINGS: A total of approximately 0.8 L of yellow fluid was removed. Samples were sent to the laboratory as requested by the clinical team. IMPRESSION: Successful ultrasound guided  right thoracentesis yielding 0.8 L of pleural fluid. Read by: Barnetta Chapel, PA-C Electronically Signed   By: Simonne Come M.D.   On: 06/04/2016 14:12    Cardiac Studies   Echo 06/01/2016:  Study Conclusions - Left ventricle: Septal apical inferior and distal anterior wall hypokinesis The cavity size was severely dilated. Wall thickness was normal. Systolic function was severely reduced. The estimated ejection fraction was in the range of 25% to 30%. Left ventricular diastolic function parameters were normal. - Aortic valve: There was mild regurgitation. - Mitral valve: There was mild regurgitation. - Left atrium: The appendage was moderately to severely dilated. - Atrial septum: No defect or patent foramen ovale was identified. - Pericardium, extracardiac: Small to moderate posterior lateral pericardial effusion no tamponade Moderate left pleural effusion.   Patient Profile     56 y.o. male with past medical history of  Bell's Palsy in 77. He was recently diagnosed with hypothyroid a few months ago with TSH reportedly >90 and his PCP started him on synthroid. He has elevated CK's and transaminase, Marked muscle weakness. We have been asked to consult for new LV dysfunction with EF 25-30%.   Assessment & Plan    Acute systolic and diastolic dysfunction. -EF 25-30%, down from 50-55% on 05/22/2016 at [facility]  -CXR: Moderately large bilateral pleural effusions. S/P Rt effusion tap-800cc.  -In the future, needs right and left cardiac catheterization for further evaluation of new LV dysfunction to assess for ischemic cause of dysfunction especially with diffuse coronary calcification noted. May need a biopsy to investigate infiltrative process. Persistent edema, would prefer to cath once edema significantly reduced. Continues to be volume overloaded. Continue diuresis, I will give Lasix 40 mg IV twice a day. Out 6.4L net I will stop his carvedilol 3.125 mg twice a day which has been reduced from 6.25 previously because he still remains volume overloaded and blood pressure is quite low. 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  Hyponatremia  - Likely from fluid overload.  - Continue with Lasix  CAD -Significant coronary calcification noted on CT imaging. -Plan for right and left heart cath once fluid status improved.   Rhabdomyolysis/ polymyositis.  -CPK 5,482--> 2716. Has been elevated for several weeks. Trended down today.  -Profound muscle weakness and pain -Was given IV fluids until found to have severe LV dysfunction and IV fluids stopped. -steroids started today  Hypothyroidism/myxedema -TSH was >90 when diagnosed  A month or so ago. Started on synthroid. Was 36.989 on 05/12/2016 -05/31/2016 TSH 15.602 -Managed by IM. Levothyroxine 175 mcg daily  Constipation -with  hemorrhoids, better today  Elevated liver enzymes  - In part, may be secondary to hepatic congestion  Signed, Donato Schultz, MD  06/08/2016, 9:30 AM

## 2016-06-08 NOTE — Progress Notes (Signed)
Pt continues to be diuresed. Plan is for CIR once medically ready and after has a cardiac catheterization. CM following.

## 2016-06-08 NOTE — Progress Notes (Signed)
PROGRESS NOTE  Connor Vargas  ZOX:[id number] DOB: 11/11/1960 DOA: 05/31/2016   PCP: Kaleen MaskELKINS,WILSON OLIVER, MD   Brief Narrative:  56 y.o. male with recently diagnosed hypothyroidism (few months ago when TSH was reportedly > 90) and his PCP started him on synthroid and the dose increased from initially 50 mcg --> currently 175 mcg but pt continued to decline in terms of overall clinical state despite treatment. [facility] about two weeks prior to this admission and was discharged home with same diagnosis of hypothyroidism and ? Myxedema. His medications were not changed and pt was told to follow up with rheumatologist which pt is scheduled to see March 6th. Pt now comes here with progressively worsening weakness, LE and UE swelling, dyspnea exertional and at rest. 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 In addition, pt's says he has not eaten anything in the past two days.   Echo done in the hospital showed new onset sCHF ef 25%, cardiology consulted.   Assessment & Plan:   Generalized B/L LE Weakness - multifactorial and secondary to hypothyroidism, rhabdomyolysis, polymyositis  - blood test for AntiJo Ab + with ANA positive, elevated aldolase, all suggestive of POLYMYOSITIS  - I called rheumatology office to discuss with specialist further recommendations - 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 No infiltrates noted on the echo.  - due to hypotension, I have stopped Losartan and spironolactone for now  - cardiology following- [facility] once medically more optimized  - monitor electrolytes    Hyponatremia - likely from fluid overload and hypothyroidism - improving, ~ 128-129 - BMP in AM     Acquired hypothyroidism - TSH is trending down based on record review  - continue synthroid     Rhabdomyolysis  - suspect from his thyroid condition at this time vs polymyositis  - anti [name] - ab > 8 (high) and with elevated aldolase and CK levels this is highly suggestive of polymyositis  - pt has an appointment with rheumatologist on March 6th, 2018 with Dr Dorathy Kinsmanhappa  - no IVF due to his cardiac status.  - CK overall trending down - continue IV steroids day #3, plan to transition to PO in 1-2 days     Transaminitis - suspect this is related to the above rhabdo and hypothyroid - trending down  - hep panel ordered- neg    External hemorrhoids - surgery consulted for assistance     B/L Moderate Pleural effusion - related to cardiac etiology and potentially PM - 780 cc fluid removed from left side and sent for analysis  - 500 cc fluid removed from right side - breathing overall better, fluid analysis with reactive mesangial cells present and likely from acute inflammatory process   DVT prophylaxis: Lovenox SQ Code Status: Full  Family Communication: Wife at bedside.  Disposition Plan: TBD, Inpatient rehab once insurance approval completed    Consultants:   Cardiology   Surgery   IR for thoracentesis   Subjective: Patient continues to report of feeling LE heaviness but improved from yesterday. No other complaints otherwise.   Objective: Vitals:   06/07/16 1611 06/07/16 2033 06/08/16 0459 06/08/16 1406  BP: 102/62 111/64 111/64 113/74  Pulse: 77 75 75 81  Resp: 18 20 18 18   Temp:  98.3 F (36.8 C) 97.5 F (36.4 C) 97.9 F (36.6  C)  TempSrc:  Oral Oral Oral  SpO2: 100% 99% 98% 98%  Weight:      Height:        Intake/Output Summary (Last 24 hours) at 06/08/16 1727 Last data filed at 06/08/16 1500  Gross per 24 hour  Intake              480 ml  Output             1475 ml  Net             -995 ml   Filed Weights   06/07/16 0553  Weight: 82.1 kg (181 lb)    Examination:  General exam: Appears calm and comfortable  Respiratory system: decreased BS at b/l bases.  Cardiovascular system: S1 & S2 heard, RRR. No JVD, murmurs, rubs, gallops or clicks. Gastrointestinal system: Abdomen is nondistended, soft and nontender. No organomegaly or masses felt. Normal bowel sounds heard. Central nervous system: Alert and oriented. No focal neurological deficits. Extremities: Symmetric 5 x 5 power. 2+ b/L swellig Skin: No rashes, lesions or ulcers Psychiatry: Judgement and insight appear normal. Mood & affect appropriate.    Data Reviewed:   CBC:  Recent Labs Lab 06/03/16 0242 06/04/16 0343 06/05/16 0345 06/06/16 0350  WBC 10.5 8.6 7.2 7.1  HGB 11.1* 10.7* 10.3* 10.4*  HCT 33.4* 32.8* 31.7* 32.1*  MCV 88.1 87.7 88.1 87.9  PLT 292 281 261 241   Basic Metabolic Panel:  Recent Labs Lab 06/03/16 0242 06/04/16 0343 06/05/16 0345 06/06/16 0350 06/07/16 0500 06/08/16 0200  NA 129* 129* 128* 128* 129* 129*  K 4.2 4.3 4.4 4.3 4.9 4.7  CL 95* 96* 96* 96* 98* 96*  CO2 25 24 24 25 24 25   GLUCOSE 88 83 76 89 110* 105*  BUN 15 16 19 19 19 18   CREATININE 0.66 0.78 0.91 0.85 0.65 0.59*  CALCIUM 8.3* 8.4* 8.2* 8.1* 8.3* 8.4*  MG 2.0 1.9 2.0  --   --   --    Liver Function Tests:  Recent Labs Lab 06/02/16 0326 06/06/16 0350 06/07/16 0500 06/08/16 0200  AST 202* 151* 124* 129*  ALT 123* 98* 95* 109*  ALKPHOS 52 45 48 50  BILITOT 0.8 0.5 0.4 0.4  PROT 5.4* 4.5* 4.8* 5.2*  ALBUMIN 2.8* 2.3* 2.4* 2.7*   Cardiac Enzymes:  Recent Labs Lab 06/02/16 1247  06/04/16 0343 06/05/16 0345 06/06/16 0350  06/07/16 0500 06/08/16 0200  CKTOTAL 4,749*  < > 5,082* 3,683* 3,943* 2,994* 2,716*  CKMB 176.0*  --   --   --   --   --   --   < > = values in this interval not displayed.   Radiology Studies: No results found.  Scheduled Meds: . aspirin EC  81 mg Oral Daily  . enoxaparin (LOVENOX) injection  40 mg Subcutaneous Q24H  . furosemide  40 mg Intravenous BID  . hydrocortisone  25 mg Rectal BID  . hydrocortisone-pramoxine  1 applicator Rectal Once  . levothyroxine  175 mcg Oral QAC breakfast  . methylPREDNISolone (SOLU-MEDROL) injection  60 mg Intravenous Daily  . sodium chloride flush  3 mL Intravenous Q12H  . witch hazel-glycerin   Topical TID   Continuous Infusions:   LOS: 8 days   Time spent: 35 mins   Debbora Presto, MD [facility] Pager [phone]  If 7PM-7AM, please contact night-coverage [email or web address] Password [facility] 06/08/2016, 5:27 PM

## 2016-06-08 NOTE — Progress Notes (Signed)
Continuing to monitor for completion of heart cath and medical readiness to initiate insurance authorization for possible IP Rehab admission.  I will continue to follow along.    Weldon PickingSusan Juanisha Bautch PT Inpatient Rehab Admissions Coordinator Cell [phone] Office [phone]]

## 2016-06-08 NOTE — Progress Notes (Signed)
Physical Therapy Treatment Patient Details Name: Connor FickleRadovan Kroenke MRN: [id number] DOB: 1960-07-06 Today's Date: 06/08/2016    History of Present Illness  Melbert Lanphier is a 56 y.o. male admitted with weakness-- unclear etiology but appears to be multifactorial and secondary to hypothyroidism, rhabdomyolysis, acute pulmonary vascular congestion. He was recently diagnosed with hypothyroidism (> 90) and started him on synthroid with increase in dose but has continued to decline in terms of overall clinical state despite treatment.    PT Comments    Pt with now a noted EF to 25%. Pt able to tolerate 150' of ambulation with RW with frequent rest breaks and maintain SpO2 >94% on RA. However pt unable to bring knee to chest or resist PT during exercises.  Pt con't to need assist for ADLs due to over all weakness and is now dependent on RW. Acute PT to con't to follow.  Follow Up Recommendations  CIR     Equipment Recommendations  Rolling walker with 5" wheels    Recommendations for Other Services Rehab consult     Precautions / Restrictions Precautions Precautions: Fall Restrictions Weight Bearing Restrictions: No    Mobility  Bed Mobility               General bed mobility comments: pt sitting up on EOB with sister present in room  Transfers Overall transfer level: Needs assistance Equipment used: Rolling walker (2 wheeled) Transfers: Sit to/from Stand Sit to Stand: Min assist         General transfer comment: minA from bed, modA from chair due to lower surface height. v/c's to have pt push up from bed  Ambulation/Gait Ambulation/Gait assistance: Min guard Ambulation Distance (Feet): 150 Feet Assistive device: Rolling walker (2 wheeled) Gait Pattern/deviations: Step-through pattern;Decreased stride length Gait velocity: decreased Gait velocity interpretation: Below normal speed for age/gender General Gait Details: pt SpO2 >94% on RA. Pt took freq standing rest breaks  due to fatigue, SOB, and bilat LE soreness however pt never sat down.    Stairs            Wheelchair Mobility    Modified Rankin (Stroke Patients Only)       Balance Overall balance assessment: Needs assistance Sitting-balance support: Feet supported;No upper extremity supported Sitting balance-Leahy Scale: Good     Standing balance support: Bilateral upper extremity supported Standing balance-Leahy Scale: Poor Standing balance comment: RW for support                    Cognition Arousal/Alertness: Awake/alert Behavior During Therapy: WFL for tasks assessed/performed Overall Cognitive Status: Within Functional Limits for tasks assessed                      Exercises General Exercises - Lower Extremity Ankle Circles/Pumps: Right;Left;Both;10 reps Quad Sets: AROM;Both;10 reps;Seated Long Arc Quad: AROM;Both;10 reps;Seated (with 5 sec hold)    General Comments General comments (skin integrity, edema, etc.): bilat LE with edeam, bilat ace wraps      Pertinent Vitals/Pain Pain Assessment: 0-10 Pain Score: 5  Pain Location: bilat LEs sore Pain Descriptors / Indicators: Sore Pain Intervention(s): Monitored during session    Home Living                      Prior Function            PT Goals (current goals can now be found in the care plan section) Acute Rehab PT Goals PT Goal Formulation:  With patient Time For Goal Achievement: 06/22/16 Potential to Achieve Goals: Good Progress towards PT goals: Progressing toward goals    Frequency    Min 3X/week      PT Plan Current plan remains appropriate    Co-evaluation             End of Session Equipment Utilized During Treatment: Gait belt Activity Tolerance: Patient limited by fatigue Patient left: in chair;with call bell/phone within reach;with family/visitor present Nurse Communication: Mobility status PT Visit Diagnosis: Difficulty in walking, not elsewhere classified  (R26.2)     Time: 8119-1478 PT Time Calculation (min) (ACUTE ONLY): 19 min  Charges:  $Gait Training: 8-22 mins                    G Codes:       Darryle Dennie M Jaquan Sadowsky 06-10-16, 2:55 PM   Lewis Shock, PT, DPT Pager #: [phone] Office #: [phone]

## 2016-06-08 NOTE — Progress Notes (Signed)
OT Cancellation Note  Patient Details Name: Renae FickleRadovan Foti MRN: [id number] DOB: 06-19-1960   Cancelled Treatment:    Reason Eval/Treat Not Completed: Other (comment). Pt states he didn't sleep well and is complaining of heel pain. Asked therapist to return later. Will attempt later as schedule allows.   [facility]  Donnella Morford, OT/L  [phone] 06/08/2016 06/08/2016, 9:06 AM

## 2016-06-09 DIAGNOSIS — I420 Dilated cardiomyopathy: Secondary | ICD-10-CM

## 2016-06-09 LAB — COMPREHENSIVE METABOLIC PANEL
ALBUMIN: 2.6 g/dL — AB (ref 3.5–5.0)
ALK PHOS: 49 U/L (ref 38–126)
ALT: 97 U/L — ABNORMAL HIGH (ref 17–63)
ANION GAP: 7 (ref 5–15)
AST: 100 U/L — AB (ref 15–41)
BILIRUBIN TOTAL: 0.5 mg/dL (ref 0.3–1.2)
BUN: 17 mg/dL (ref 6–20)
CO2: 27 mmol/L (ref 22–32)
Calcium: 8.5 mg/dL — ABNORMAL LOW (ref 8.9–10.3)
Chloride: 97 mmol/L — ABNORMAL LOW (ref 101–111)
Creatinine, Ser: 0.68 mg/dL (ref 0.61–1.24)
GFR calc Af Amer: >60 mL/min (ref >60)
GFR calc non Af Amer: >60 mL/min (ref >60)
Glucose, Bld: 102 mg/dL — ABNORMAL HIGH (ref 65–99)
POTASSIUM: 4.2 mmol/L (ref 3.5–5.1)
SODIUM: 131 mmol/L — AB (ref 135–145)
TOTAL PROTEIN: 5 g/dL — AB (ref 6.5–8.1)

## 2016-06-09 LAB — CULTURE, BODY FLUID W GRAM STAIN -BOTTLE: Culture: NO GROWTH

## 2016-06-09 LAB — CK: Total CK: 2005 U/L — ABNORMAL HIGH (ref 49–397)

## 2016-06-09 LAB — CULTURE, BODY FLUID-BOTTLE

## 2016-06-09 MED ORDER — MAGNESIUM HYDROXIDE 400 MG/5ML PO SUSP: 15.0000 mL | Freq: Every day | ORAL | Status: DC | PRN

## 2016-06-09 MED ORDER — MAGNESIUM HYDROXIDE 400 MG/5ML PO SUSP
30.0000 mL | Freq: Two times a day (BID) | ORAL | Status: DC
  Administered 2016-06-09 – 2016-06-10 (×2): 30 mL via ORAL
  Filled 2016-06-09 (×3): qty 30

## 2016-06-09 NOTE — Progress Notes (Signed)
Occupational Therapy Treatment Patient Details Name: Renae FickleRadovan Bevard MRN: [id number] DOB: 09/29/1960 Today's Date: 06/09/2016    History of present illness  Siegfried Hancher is a 56 y.o. male admitted with weakness-- unclear etiology but appears to be multifactorial and secondary to hypothyroidism, rhabdomyolysis, acute pulmonary vascular congestion. He was recently diagnosed with hypothyroidism (> 90) and started him on synthroid with increase in dose but has continued to decline in terms of overall clinical state despite treatment.   OT comments  Pt is making excellent progress and is very motivated to regain his independence. Pt currently requires mod A with sit - stand from lower surfaces and mod A with LB ADL. Pt appears weaker proximally than distally, which decreases his independence and safety with mobility and ADL. Continue to recommend CIR to maximize pt's functional level of independence and facilitate safe DC home with his supportive family. Pt very appreciative.   Follow Up Recommendations  CIR;Supervision/Assistance - 24 hour    Equipment Recommendations  3 in 1 bedside commode;Tub/shower seat    Recommendations for Other Services Rehab consult    Precautions / Restrictions Precautions Precautions: Fall       Mobility Bed Mobility               General bed mobility comments: OOB in chair  Transfers Overall transfer level: Needs assistance Equipment used: Rolling walker (2 wheeled) Transfers: Sit to/from Stand Sit to Stand: Mod assist         General transfer comment: from chair. vc to scoot hips forward and position legs underneath him and increase amount he shifts weight anteriorly in order to facilitate sit- stand transfer    Balance     Sitting balance-Leahy Scale: Good       Standing balance-Leahy Scale: Fair Standing balance comment: Able to release RW to pull shorts up over hips with minguard A                   ADL Overall ADL's : Needs  assistance/impaired     Grooming: Set up;Sitting   Upper Body Bathing: Set up;Sitting   Lower Body Bathing: Moderate assistance;Sit to/from stand   Upper Body Dressing : Set up;Supervision/safety;Sitting   Lower Body Dressing: Moderate assistance;Sit to/from stand Lower Body Dressing Details (indicate cue type and reason): A to lift feet off floor to donn socks/underwear over feet. Fearful of coming forward. unable to corss feet over knees due to "soreness and hip tightness" Toilet Transfer: Moderate assistance Toilet Transfer Details (indicate cue type and reason): to power up from lower surface. min A to powr up from higher surface. Toileting- Clothing Manipulation and Hygiene: Minimal assistance;Sitting/lateral lean       Functional mobility during ADLs: Minimal assistance;Rolling walker General ADL Comments: Proximal weakness makes LB ADL difficult. Pt also has weakness  in B shoulder girdles which makes UB dressing difficult. At times, pt demonstrates difficultywith  transitions anteriorly in sitting due to weak core muscles  Explained role of OT and goal to regain independence with ADL and functional mobility. Pt stated "that's what I need"      Vision                     Perception     Praxis      Cognition   Behavior During Therapy: [facility] for tasks assessed/performed Overall Cognitive Status: Within Functional Limits for tasks assessed  Exercises Other Exercises Other Exercises: Shoulder strengthenin gB UE using PNF D1/D2 diagonals Other Exercises: Midline crossing activities to address trunk strengthening   Shoulder Instructions       General Comments  Pt explained how he was a refugee from [place] and how he worked so hard to support his family. "Now I need to get better"    Pertinent Vitals/ Pain       Pain Assessment: Faces Faces Pain Scale: Hurts little more Pain Location: bilat LEs sore; hips Pain Descriptors /  Indicators: Discomfort;Sore Pain Intervention(s): Limited activity within patient's tolerance  Home Living                                          Prior Functioning/Environment              Frequency  Min 3X/week        Progress Toward Goals  OT Goals(current goals can now be found in the care plan section)  Progress towards OT goals: Progressing toward goals  Acute Rehab OT Goals Patient Stated Goal: to get stronger OT Goal Formulation: With patient Time For Goal Achievement: 06/15/16 Potential to Achieve Goals: Good ADL Goals Pt Will Perform Grooming: with min guard assist;standing Pt Will Perform Lower Body Dressing: with min guard assist;sit to/from stand;with adaptive equipment Pt Will Transfer to Toilet: with min guard assist;ambulating;bedside commode Pt Will Perform Toileting - Clothing Manipulation and hygiene: with min guard assist;sit to/from stand Additional ADL Goal #1: Pt will be Mod I in and OOB for basic ADLs with HOB flat and no rail  Plan Discharge plan remains appropriate    Co-evaluation                 End of Session Equipment Utilized During Treatment: Gait belt;Rolling walker  OT Visit Diagnosis: Unsteadiness on feet (R26.81);Muscle weakness (generalized) (M62.81);Pain Pain - part of body: Hip;Leg   Activity Tolerance Patient tolerated treatment well   Patient Left in chair;with call bell/phone within reach;with family/visitor present   Nurse Communication Mobility status        Time: 1610-9604 OT Time Calculation (min): 27 min  Charges: OT General Charges $OT Visit: 1 Procedure OT Treatments $Self Care/Home Management : 8-22 mins $Therapeutic Activity: 8-22 mins  [facility], OT/L  [phone] 06/09/2016   Drina Jobst,HILLARY 06/09/2016, 3:01 PM

## 2016-06-09 NOTE — Progress Notes (Signed)
Progress Note  Patient Name: Connor Vargas Date of Encounter: 06/09/2016  Primary Cardiologist: new - Dr. Tresa Endo  Subjective   Patient is feeling well; denies chest pain and palpitations. He states his breathing is better and his heel pain has lessened.   Inpatient Medications    Scheduled Meds: . aspirin EC  81 mg Oral Daily  . enoxaparin (LOVENOX) injection  40 mg Subcutaneous Q24H  . furosemide  40 mg Intravenous BID  . hydrocortisone  25 mg Rectal BID  . hydrocortisone-pramoxine  1 applicator Rectal Once  . levothyroxine  175 mcg Oral QAC breakfast  . magnesium hydroxide  30 mL Oral BID  . methylPREDNISolone (SOLU-MEDROL) injection  60 mg Intravenous Daily  . sodium chloride flush  3 mL Intravenous Q12H  . witch hazel-glycerin   Topical TID   Continuous Infusions:  PRN Meds: lidocaine, magnesium hydroxide, ondansetron **OR** ondansetron (ZOFRAN) IV, oxyCODONE, traZODone   Vital Signs    Vitals:   06/08/16 2000 06/08/16 2147 06/09/16 0500 06/09/16 0629  BP: 123/80 112/74  99/60  Pulse: 70 63  71  Resp: 18 18  20   Temp: 98.2 F (36.8 C) 97.6 F (36.4 C)  98 F (36.7 C)  TempSrc: Oral Oral  Oral  SpO2: 99% 100%    Weight:   177 lb 4.8 oz (80.4 kg)   Height:        Intake/Output Summary (Last 24 hours) at 06/09/16 1142 Last data filed at 06/09/16 1000  Gross per 24 hour  Intake              240 ml  Output             3850 ml  Net            -3610 ml   Filed Weights   06/07/16 0553 06/09/16 0500  Weight: 181 lb (82.1 kg) 177 lb 4.8 oz (80.4 kg)     Physical Exam   General: Well developed, well nourished, male appearing in no acute distress, but weak Head: Normocephalic, atraumatic.  Neck: Supple without bruits, JVD. Lungs:  Resp regular and unlabored, Clear on the left, crackles in the right base. Heart: RRR, S1, S2, no S3, S4, or murmur; no rub. Abdomen: Soft, non-tender, non-distended with normoactive bowel sounds. No hepatomegaly. No  rebound/guarding. No obvious abdominal masses. Extremities: No clubbing, cyanosis, at least 2+ edema. Legs wrapped from mid-calf to toes; toes are pink and move normally Neuro: Alert and oriented X 3. Moves all extremities spontaneously. Psych: Normal affect.  Labs    Chemistry Recent Labs Lab 06/07/16 0500 06/08/16 0200 06/09/16 0215  NA 129* 129* 131*  K 4.9 4.7 4.2  CL 98* 96* 97*  CO2 24 25 27   GLUCOSE 110* 105* 102*  BUN 19 18 17   CREATININE 0.65 0.59* 0.68  CALCIUM 8.3* 8.4* 8.5*  PROT 4.8* 5.2* 5.0*  ALBUMIN 2.4* 2.7* 2.6*  AST 124* 129* 100*  ALT 95* 109* 97*  ALKPHOS 48 50 49  BILITOT 0.4 0.4 0.5  GFRNONAA >60 >60 >60  GFRAA >60 >60 >60  ANIONGAP 7 8 7      Hematology Recent Labs Lab 06/04/16 0343 06/05/16 0345 06/06/16 0350  WBC 8.6 7.2 7.1  RBC 3.74* 3.60* 3.65*  HGB 10.7* 10.3* 10.4*  HCT 32.8* 31.7* 32.1*  MCV 87.7 88.1 87.9  MCH 28.6 28.6 28.5  MCHC 32.6 32.5 32.4  RDW 14.8 14.9 14.8  PLT 281 261 241    Cardiac EnzymesNo  results for input(s): TROPONINI in the last 168 hours. No results for input(s): TROPIPOC in the last 168 hours.   BNP Recent Labs Lab 06/02/16 1247 06/05/16 0345  BNP 1,168.0* 1,155.7*     DDimer No results for input(s): DDIMER in the last 168 hours.   Radiology    No results found.   Telemetry    NSR in the 70s - Personally Reviewed  ECG    No new tracings - Personally Reviewed   Cardiac Studies   Echo 06/01/2016:  Study Conclusions - Left ventricle: Septal apical inferior and distal anterior wall hypokinesis The cavity size was severely dilated. Wall thickness was normal. Systolic function was severely reduced. The estimated ejection fraction was in the range of 25% to 30%. Left ventricular diastolic function parameters were normal. - Aortic valve: There was mild regurgitation. - Mitral valve: There was mild regurgitation. - Left atrium: The appendage was moderately to severely dilated. -  Atrial septum: No defect or patent foramen ovale was identified. - Pericardium, extracardiac: Small to moderate posterior lateral pericardial effusion no tamponade Moderate left pleural effusion.   Patient Profile     56 y.o. male with past medical history of Bell's Palsy in 74. He was recently diagnosed with hypothyroid a few months ago with TSH reportedly >90 and his PCP started him on synthroid. He has elevated CK's and transaminase, marked muscle weakness. We have been asked to consult for new LV dysfunction with EF 25-30%.  Assessment & Plan    1. Acute systolic and diastolic dysfunction. -EF 25-30%, down from 50-55% on 05/22/2016 at [facility]  -CXR: Moderately large bilateral pleural effusions. s/p Rt effusion tap - 800cc (06/04/16) and s/p left effusion tap - 500cc (06/05/16) -In the future, needs right and left cardiac catheterization for further evaluation of new LV dysfunction to assess for ischemic cause of dysfunction especially with diffuse coronary calcification noted. May need a biopsy to investigate infiltrative process. Persistent edema, would prefer to cath once edema significantly reduced. - Continues to be volume overloaded, but it diuresing well. Continue diuresis with 40 mg IV lasix BID: net negative 10L with 4.2 L urine output yesterday; wt 177 lbs (184 lbs on admission) - BP remains marginal; continue to hold coreg - He states he is not having SOB or trouble breathing, on room air. Remains a 2 person assist to reposition or sit up in bed. He ambulated early this morning more than usual, but is now a bit more tired and weak   2. Hyponatremia  - Likely from fluid overload.  - Continue with Lasix - Na 131 (129); asymptomatic   3. CAD -Significant coronary calcification noted on CT imaging. -Plan for right and left heart cath once fluid status improved.    4. Rhabdomyolysis/ polymyositis.  -CPK 5,482--> 2716 --> 2005. Has been elevated for several weeks. Continues to  trend down  -Profound muscle weakness and pain -Was given IV fluids until found to have severe LV dysfunction and IV fluids stopped. -steroids started 06/06/16   5. Hypothyroidism/myxedema -TSH was >90 when diagnosed A month or so ago. Started on synthroid. Was 36.989 on 05/12/2016 -05/31/2016 TSH 15.602 -Managed by IM. Levothyroxine 175 mcg daily   6. Constipation -with hemorrhoids, better today   7. Elevated liver enzymes  - In part, may be secondary to hepatic congestion - AST and ALT continue to trend down   Signed, Marcelino Duster , PA-C 11:42 AM 06/09/2016 Pager: [phone]  Personally seen and examined. Agree with  above. Still with vol overload but Na is improving. Continue lasix.  Edema reduced. Heel pain reduced.  Not ready for cath.  Donato SchultzMark Sherley Leser, MD

## 2016-06-09 NOTE — Progress Notes (Addendum)
Inpatient Rehabilitation  I spoke with Dr. Izola PriceMyers yesterday evening about Mr. Connor Vargas and course of his medical care.  She indicates pt.'s heart cath will be deferred at this time until he is stronger.  She indicates [facility] but is attempting to reschedule to allow for IP Rehab admission if we can get insurance authorization.  Pt. is in agreement with need for IP Rehab and understands that insurance company will decide to approve or deny.  I am initiating the insurance authorization process for a possible IP Rehab admission pending insurance approval, medical readiness and bed availability.  Please call if questions.  Connor PickingSusan Kodie Vargas PT Inpatient Rehab Admissions Coordinator Cell [phone] Office [phone]

## 2016-06-09 NOTE — Progress Notes (Signed)
PROGRESS NOTE  Connor Vargas  WGN:[id number] DOB: 02-19-1961 DOA: 05/31/2016   PCP: Kaleen Mask, MD   Brief Narrative:  56 y.o. male with recently diagnosed hypothyroidism (few months ago when TSH was reportedly > 90) and his PCP started him on synthroid and the dose increased from initially 50 mcg --> currently 175 mcg but pt continued to decline in terms of overall clinical state despite treatment. He was sent to [facility] hospital about two weeks prior to this admission and was discharged home with same diagnosis of hypothyroidism and ? Myxedema. His medications were not changed and pt was told to follow up with rheumatologist which pt is scheduled to see March 6th. Pt now comes here with progressively worsening weakness, LE and UE swelling, dyspnea exertional and at rest. 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 In addition, pt's says he has not eaten anything in the past two days.   Echo done in the hospital showed new onset sCHF ef 25%, cardiology team has been assisting with management. During this hospital stay, pt had blood work done suggestive of new diagnosis of polymyositis. Pt was however, recently diagnosed with acute hypothyroid flare and has been treated with synthroid. His TSH has trended down from > 95 down to 15 on this admission. After discussion with Dr. 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   Assessment & Plan:   Generalized B/L LE Weakness - multifactorial and secondary to hypothyroidism, rhabdomyolysis, polymyositis  - blood test for AntiJo Ab + with  ANA positive, elevated aldolase, all suggestive of POLYMYOSITIS  - I called rheumatology office to discuss with specialist further recommendations - continue IV Solumedrol for now until CK levels at least down in 1000's and provided pt's strength is improving as it already is, further Prednisone taper is recommended: Prednisone 60 mg PO QD for 2 weeks followed by Prednisone 50 mf PO QD for 2 weeks Prednisone 40 mg PO QD for 2 weeks until sees rheumatologist  - pt will eventually need muscle biopsy to confirm the actual diagnosis in an effort to place on appropriate long term medical regimen but for now this plan has been on hold as rheumatologist recommended holding off on muscle biopsy until CK level stable due to high risk of muscle necrosis  - today is day #4 of IV solumedrol, CK level is nicely trending down and pt's strength is overall improving  - pt now has an appointment with rheumatologist Dr. 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 No infiltrates noted on the echo.  - due to hypotension, I have stopped Losartan and spironolactone for now  - cardiology following- plans for [facility] and [facility] once medically more optimized  - monitor electrolytes  Hyponatremia - likely from fluid overload and hypothyroidism - improving, 131 this AM - BMP in AM   Acquired hypothyroidism - TSH is  trending down based on record review  - continue synthroid  - [facility]  - I have called him to let   Rhabdomyolysis  - suspect from his thyroid condition at this time vs polymyositis  - anti [name] - ab > 8 (high) and with elevated aldolase and CK levels this is highly suggestive of polymyositis  - pt has an appointment with rheumatologist as outlined  above  - CK trending down - repeat CK in AM - steroids as noted above     Transaminitis - suspect this is related to the above rhabdo and hypothyroid - trending down  - hep panel ordered- neg    External hemorrhoids - surgery consulted for assistance and provided recommendations - this is better now  - keep on bowel regimen     B/L Moderate Pleural effusion - related to cardiac etiology and potentially PM - 780 cc fluid removed from left side and sent for analysis  - 500 cc fluid removed from right side - breathing overall better, fluid analysis with reactive mesangial cells present and likely from acute inflammatory process   DVT prophylaxis: Lovenox SQ Code Status: Full  Family Communication: Wife at bedside. Disposition Plan: TBD, Inpatient rehab once insurance approval completed   Consultants:   Cardiology   Surgery   IR for thoracentesis   Subjective: Patient reports improved strength in LE and UE.  Objective: Vitals:   06/08/16 2000 06/08/16 2147 06/09/16 0500 06/09/16 0629  BP: 123/80 112/74  99/60  Pulse: 70 63  71  Resp: 18 18  20   Temp: 98.2 F (36.8 C) 97.6 F (36.4 C)  98 F (36.7 C)  TempSrc: Oral Oral  Oral  SpO2: 99% 100%    Weight:   80.4 kg (177 lb 4.8 oz)   Height:        Intake/Output Summary (Last 24 hours) at 06/09/16 1604 Last data filed at 06/09/16 1000  Gross per 24 hour  Intake                0 ml  Output             3050 ml  Net            -3050 ml   Filed Weights   06/07/16 0553 06/09/16 0500  Weight: 82.1 kg (181 lb) 80.4 kg (177 lb 4.8 oz)    Examination:  General exam: Appears calm and comfortable  Respiratory system: improved air movement bilaterally  Cardiovascular system: S1 & S2 heard, RRR. No JVD, murmurs, rubs, gallops or clicks. Gastrointestinal system: Abdomen is nondistended, soft and nontender. No organomegaly or masses felt. Normal bowel sounds heard. Central nervous system: Alert and oriented. No focal  neurological deficits. Extremities: Symmetric 5 x 5 power. 1+ b/L swellig Skin: No rashes, lesions or ulcers Psychiatry: Judgement and insight appear normal. Mood & affect appropriate.   Data Reviewed:   CBC:  Recent Labs Lab 06/03/16 0242 06/04/16 0343 06/05/16 0345 06/06/16 0350  WBC 10.5 8.6 7.2 7.1  HGB 11.1* 10.7* 10.3* 10.4*  HCT 33.4* 32.8* 31.7* 32.1*  MCV 88.1 87.7 88.1 87.9  PLT 292 281 261 241   Basic Metabolic Panel:  Recent Labs Lab 06/03/16 0242 06/04/16 0343 06/05/16 0345 06/06/16 0350 06/07/16 0500 06/08/16 0200 06/09/16 0215  NA 129* 129* 128* 128* 129* 129* 131*  K 4.2 4.3 4.4 4.3 4.9 4.7 4.2  CL 95* 96* 96* 96* 98* 96* 97*  CO2 25 24 24 25 24 25 27   GLUCOSE 88 83 76 89 110* 105* 102*  BUN 15 16 19 19 19 18 17   CREATININE 0.66 0.78 0.91 0.85 0.65 0.59* 0.68  CALCIUM 8.3* 8.4* 8.2* 8.1* 8.3* 8.4* 8.5*  MG 2.0 1.9 2.0  --   --   --   --    Liver Function Tests:  Recent Labs Lab 06/06/16 0350 06/07/16 0500 06/08/16 0200 06/09/16 0215  AST 151* 124* 129* 100*  ALT 98* 95* 109* 97*  ALKPHOS 45 48 50 49  BILITOT 0.5 0.4 0.4 0.5  PROT 4.5* 4.8* 5.2* 5.0*  ALBUMIN 2.3* 2.4* 2.7* 2.6*   Cardiac Enzymes:  Recent Labs Lab 06/05/16 0345 06/06/16 0350 06/07/16 0500 06/08/16 0200 06/09/16 0215  CKTOTAL 6,962* 9,528* 2,994* 2,716* 2,005*    Radiology Studies: No results found.  Scheduled Meds: . aspirin EC  81 mg Oral Daily  . enoxaparin (LOVENOX) injection  40 mg Subcutaneous Q24H  . furosemide  40 mg Intravenous BID  . hydrocortisone  25 mg Rectal BID  . hydrocortisone-pramoxine  1 applicator Rectal Once  . levothyroxine  175 mcg Oral QAC breakfast  . magnesium hydroxide  30 mL Oral BID  . methylPREDNISolone (SOLU-MEDROL) injection  60 mg Intravenous Daily  . sodium chloride flush  3 mL Intravenous Q12H  . witch hazel-glycerin   Topical TID   Continuous Infusions:   LOS: 9 days   Time spent: 35 mins   Debbora Presto,  MD [facility] Pager [phone]  If 7PM-7AM, please contact night-coverage [email or web address] Password [email or web address] 06/09/2016, 4:04 PM

## 2016-06-10 ENCOUNTER — Encounter (HOSPITAL_COMMUNITY): Payer: Self-pay

## 2016-06-10 ENCOUNTER — Inpatient Hospital Stay (HOSPITAL_COMMUNITY)
Admission: RE | Admit: 2016-06-10 | Discharge: 2016-06-20 | DRG: 545 | Disposition: A | Discharge status: Home Health | Payer: BLUE CROSS/BLUE SHIELD | Source: Intra-hospital | Attending: Physical Medicine & Rehabilitation | Admitting: Physical Medicine & Rehabilitation

## 2016-06-10 DIAGNOSIS — M3320 Polymyositis, organ involvement unspecified: Secondary | ICD-10-CM | POA: Diagnosis present

## 2016-06-10 DIAGNOSIS — K644 Residual hemorrhoidal skin tags: Secondary | ICD-10-CM | POA: Diagnosis present

## 2016-06-10 DIAGNOSIS — K5901 Slow transit constipation: Secondary | ICD-10-CM | POA: Diagnosis not present

## 2016-06-10 DIAGNOSIS — E871 Hypo-osmolality and hyponatremia: Secondary | ICD-10-CM | POA: Diagnosis present

## 2016-06-10 DIAGNOSIS — R5381 Other malaise: Secondary | ICD-10-CM | POA: Diagnosis present

## 2016-06-10 DIAGNOSIS — I252 Old myocardial infarction: Secondary | ICD-10-CM

## 2016-06-10 DIAGNOSIS — D72829 Elevated white blood cell count, unspecified: Secondary | ICD-10-CM

## 2016-06-10 DIAGNOSIS — D62 Acute posthemorrhagic anemia: Secondary | ICD-10-CM | POA: Diagnosis present

## 2016-06-10 DIAGNOSIS — I5042 Chronic combined systolic (congestive) and diastolic (congestive) heart failure: Secondary | ICD-10-CM

## 2016-06-10 DIAGNOSIS — K649 Unspecified hemorrhoids: Secondary | ICD-10-CM

## 2016-06-10 DIAGNOSIS — T380X5A Adverse effect of glucocorticoids and synthetic analogues, initial encounter: Secondary | ICD-10-CM | POA: Diagnosis present

## 2016-06-10 DIAGNOSIS — D72828 Other elevated white blood cell count: Secondary | ICD-10-CM | POA: Diagnosis not present

## 2016-06-10 DIAGNOSIS — Z79899 Other long term (current) drug therapy: Secondary | ICD-10-CM | POA: Diagnosis not present

## 2016-06-10 DIAGNOSIS — E039 Hypothyroidism, unspecified: Secondary | ICD-10-CM | POA: Diagnosis not present

## 2016-06-10 DIAGNOSIS — I5041 Acute combined systolic (congestive) and diastolic (congestive) heart failure: Secondary | ICD-10-CM | POA: Diagnosis present

## 2016-06-10 DIAGNOSIS — E8809 Other disorders of plasma-protein metabolism, not elsewhere classified: Secondary | ICD-10-CM | POA: Diagnosis present

## 2016-06-10 DIAGNOSIS — R740 Nonspecific elevation of levels of transaminase and lactic acid dehydrogenase [LDH]: Secondary | ICD-10-CM | POA: Diagnosis not present

## 2016-06-10 DIAGNOSIS — R748 Abnormal levels of other serum enzymes: Secondary | ICD-10-CM | POA: Diagnosis present

## 2016-06-10 DIAGNOSIS — E038 Other specified hypothyroidism: Secondary | ICD-10-CM | POA: Diagnosis present

## 2016-06-10 DIAGNOSIS — J810 Acute pulmonary edema: Secondary | ICD-10-CM

## 2016-06-10 DIAGNOSIS — E46 Unspecified protein-calorie malnutrition: Secondary | ICD-10-CM | POA: Diagnosis present

## 2016-06-10 DIAGNOSIS — R42 Dizziness and giddiness: Secondary | ICD-10-CM | POA: Diagnosis not present

## 2016-06-10 DIAGNOSIS — Z8249 Family history of ischemic heart disease and other diseases of the circulatory system: Secondary | ICD-10-CM

## 2016-06-10 DIAGNOSIS — Z7982 Long term (current) use of aspirin: Secondary | ICD-10-CM

## 2016-06-10 DIAGNOSIS — I959 Hypotension, unspecified: Secondary | ICD-10-CM | POA: Diagnosis not present

## 2016-06-10 DIAGNOSIS — I2510 Atherosclerotic heart disease of native coronary artery without angina pectoris: Secondary | ICD-10-CM | POA: Diagnosis present

## 2016-06-10 DIAGNOSIS — K5900 Constipation, unspecified: Secondary | ICD-10-CM | POA: Diagnosis not present

## 2016-06-10 DIAGNOSIS — Z9889 Other specified postprocedural states: Secondary | ICD-10-CM

## 2016-06-10 DIAGNOSIS — J90 Pleural effusion, not elsewhere classified: Secondary | ICD-10-CM

## 2016-06-10 DIAGNOSIS — M3322 Polymyositis with myopathy: Secondary | ICD-10-CM | POA: Diagnosis present

## 2016-06-10 DIAGNOSIS — R52 Pain, unspecified: Secondary | ICD-10-CM

## 2016-06-10 DIAGNOSIS — E876 Hypokalemia: Secondary | ICD-10-CM

## 2016-06-10 LAB — COMPREHENSIVE METABOLIC PANEL
ALK PHOS: 45 U/L (ref 38–126)
ALT: 91 U/L — AB (ref 17–63)
AST: 88 U/L — ABNORMAL HIGH (ref 15–41)
Albumin: 2.5 g/dL — ABNORMAL LOW (ref 3.5–5.0)
Anion gap: 8 (ref 5–15)
BUN: 15 mg/dL (ref 6–20)
CALCIUM: 8.3 mg/dL — AB (ref 8.9–10.3)
CO2: 27 mmol/L (ref 22–32)
CREATININE: 0.66 mg/dL (ref 0.61–1.24)
Chloride: 95 mmol/L — ABNORMAL LOW (ref 101–111)
GFR calc non Af Amer: >60 mL/min (ref >60)
GLUCOSE: 103 mg/dL — AB (ref 65–99)
Potassium: 4 mmol/L (ref 3.5–5.1)
SODIUM: 130 mmol/L — AB (ref 135–145)
TOTAL PROTEIN: 4.8 g/dL — AB (ref 6.5–8.1)
Total Bilirubin: 0.4 mg/dL (ref 0.3–1.2)

## 2016-06-10 LAB — CREATININE, SERUM
CREATININE: 0.7 mg/dL (ref 0.61–1.24)
GFR calc Af Amer: >60 mL/min (ref >60)
GFR calc non Af Amer: >60 mL/min (ref >60)

## 2016-06-10 LAB — CBC
HCT: 37.5 % — ABNORMAL LOW (ref 39.0–52.0)
HEMOGLOBIN: 12 g/dL — AB (ref 13.0–17.0)
MCH: 28.4 pg (ref 26.0–34.0)
MCHC: 32 g/dL (ref 30.0–36.0)
MCV: 88.9 fL (ref 78.0–100.0)
Platelets: 282 10*3/uL (ref 150–400)
RBC: 4.22 MIL/uL (ref 4.22–5.81)
RDW: 14.5 % (ref 11.5–15.5)
WBC: 12.2 10*3/uL — ABNORMAL HIGH (ref 4.0–10.5)

## 2016-06-10 LAB — CK: Total CK: 1736 U/L — ABNORMAL HIGH (ref 49–397)

## 2016-06-10 MED ORDER — FUROSEMIDE 80 MG PO TABS
80.0000 mg | ORAL_TABLET | Freq: Two times a day (BID) | ORAL | Status: DC
  Administered 2016-06-10: 80 mg via ORAL
  Filled 2016-06-10: qty 1

## 2016-06-10 MED ORDER — HYDROCORTISONE ACETATE 25 MG RE SUPP
25.0000 mg | Freq: Two times a day (BID) | RECTAL | Status: DC
  Administered 2016-06-11 – 2016-06-20 (×18): 25 mg via RECTAL
  Filled 2016-06-10 (×19): qty 1

## 2016-06-10 MED ORDER — LIDOCAINE 5 % EX OINT
TOPICAL_OINTMENT | Freq: Three times a day (TID) | CUTANEOUS | Status: DC | PRN
  Administered 2016-06-15: 1 via TOPICAL
  Administered 2016-06-15 – 2016-06-18 (×3): via TOPICAL
  Filled 2016-06-10: qty 35.44

## 2016-06-10 MED ORDER — FUROSEMIDE 80 MG PO TABS: 80.0000 mg | ORAL_TABLET | Freq: Two times a day (BID) | ORAL | Status: DC

## 2016-06-10 MED ORDER — PREDNISONE 20 MG PO TABS
60.0000 mg | ORAL_TABLET | Freq: Every day | ORAL | Status: DC
  Filled 2016-06-10: qty 3

## 2016-06-10 MED ORDER — FUROSEMIDE 80 MG PO TABS
80.0000 mg | ORAL_TABLET | Freq: Two times a day (BID) | ORAL | Status: DC
  Administered 2016-06-11 – 2016-06-17 (×14): 80 mg via ORAL
  Filled 2016-06-10 (×14): qty 1
  Filled 2016-06-10: qty 2

## 2016-06-10 MED ORDER — ASPIRIN EC 81 MG PO TBEC
81.0000 mg | DELAYED_RELEASE_TABLET | Freq: Every day | ORAL | Status: DC
  Administered 2016-06-11 – 2016-06-20 (×10): 81 mg via ORAL
  Filled 2016-06-10 (×10): qty 1

## 2016-06-10 MED ORDER — PANTOPRAZOLE SODIUM 40 MG PO TBEC
40.0000 mg | DELAYED_RELEASE_TABLET | Freq: Every day | ORAL | Status: DC
  Administered 2016-06-10: 40 mg via ORAL
  Filled 2016-06-10: qty 1

## 2016-06-10 MED ORDER — PREDNISONE 20 MG PO TABS: 60.0000 mg | ORAL_TABLET | Freq: Every day | ORAL | Status: DC

## 2016-06-10 MED ORDER — TRAZODONE HCL 50 MG PO TABS: 50.0000 mg | ORAL_TABLET | Freq: Every evening | ORAL | Status: DC | PRN

## 2016-06-10 MED ORDER — ENOXAPARIN SODIUM 40 MG/0.4ML ~~LOC~~ SOLN
40.0000 mg | SUBCUTANEOUS | Status: DC
  Administered 2016-06-11 – 2016-06-19 (×9): 40 mg via SUBCUTANEOUS
  Filled 2016-06-10 (×9): qty 0.4

## 2016-06-10 MED ORDER — ONDANSETRON HCL 4 MG/2ML IJ SOLN: 4.0000 mg | Freq: Four times a day (QID) | INTRAMUSCULAR | Status: DC | PRN

## 2016-06-10 MED ORDER — OXYCODONE HCL 5 MG PO TABS
5.0000 mg | ORAL_TABLET | ORAL | Status: DC | PRN
  Administered 2016-06-13 – 2016-06-19 (×10): 5 mg via ORAL
  Filled 2016-06-10 (×11): qty 1

## 2016-06-10 MED ORDER — ONDANSETRON HCL 4 MG PO TABS: 4.0000 mg | ORAL_TABLET | Freq: Four times a day (QID) | ORAL | Status: DC | PRN

## 2016-06-10 MED ORDER — PANTOPRAZOLE SODIUM 40 MG PO TBEC: 40.0000 mg | DELAYED_RELEASE_TABLET | Freq: Every day | ORAL | Status: DC

## 2016-06-10 MED ORDER — WITCH HAZEL-GLYCERIN EX PADS
MEDICATED_PAD | Freq: Three times a day (TID) | CUTANEOUS | Status: DC
  Administered 2016-06-11: 14:00:00 via TOPICAL
  Administered 2016-06-11: 1 via TOPICAL
  Administered 2016-06-11 – 2016-06-13 (×4): via TOPICAL
  Administered 2016-06-13 – 2016-06-16 (×6): 1 via TOPICAL
  Administered 2016-06-17 – 2016-06-19 (×6): via TOPICAL
  Filled 2016-06-10: qty 100

## 2016-06-10 MED ORDER — ENOXAPARIN SODIUM 40 MG/0.4ML ~~LOC~~ SOLN: 40.0000 mg | SUBCUTANEOUS | Status: DC

## 2016-06-10 MED ORDER — LEVOTHYROXINE SODIUM 75 MCG PO TABS
175.0000 ug | ORAL_TABLET | Freq: Every day | ORAL | Status: DC
  Administered 2016-06-11 – 2016-06-20 (×10): 175 ug via ORAL
  Filled 2016-06-10 (×10): qty 1

## 2016-06-10 MED ORDER — SORBITOL 70 % SOLN
30.0000 mL | Freq: Every day | Status: DC | PRN
  Administered 2016-06-15: 30 mL via ORAL
  Filled 2016-06-10: qty 30

## 2016-06-10 NOTE — Progress Notes (Signed)
Occupational Therapy Treatment Patient Details Name: Connor Vargas MRN: [id number] DOB: February 03, 1961 Today's Date: 06/10/2016    History of present illness  Connor Vargas is a 56 y.o. male admitted with weakness-- unclear etiology but appears to be multifactorial and secondary to hypothyroidism, rhabdomyolysis, acute pulmonary vascular congestion. He was recently diagnosed with hypothyroidism (> 90) and started him on synthroid with increase in dose but has continued to decline in terms of overall clinical state despite treatment.   OT comments  Pt making progress with functional goals. OTwill continue to follow acutely  Follow Up Recommendations  CIR;Supervision/Assistance - 24 hour    Equipment Recommendations  3 in 1 bedside commode;Tub/shower seat    Recommendations for Other Services Rehab consult    Precautions / Restrictions Precautions Precautions: Fall Restrictions Weight Bearing Restrictions: No       Mobility Bed Mobility Overal bed mobility: Needs Assistance Bed Mobility: Supine to Sit     Supine to sit: Min assist     General bed mobility comments: min A to elevate trunk, pt used rails  Transfers Overall transfer level: Needs assistance Equipment used: Rolling walker (2 wheeled) Transfers: Sit to/from Stand Sit to Stand: Min assist         General transfer comment: cues for correct hand placement    Balance Overall balance assessment: Needs assistance Sitting-balance support: Feet supported;No upper extremity supported Sitting balance-Leahy Scale: Good     Standing balance support: Bilateral upper extremity supported;Single extremity supported;During functional activity   Standing balance comment: requires RW for stability                   ADL Overall ADL's : Needs assistance/impaired     Grooming: Standing;Min guard;Wash/dry hands;Wash/dry face Grooming Details (indicate cue type and reason): increased time     Lower Body Bathing:  Sit to/from stand;Minimal assistance (simulated)       Lower Body Dressing: Moderate assistance;Sit to/from stand Lower Body Dressing Details (indicate cue type and reason): simulated Toilet Transfer: Minimal assistance;Moderate assistance;Grab bars;Ambulation;Comfort height toilet Toilet Transfer Details (indicate cue type and reason): min A for stand - sit, mod A for sit - stand. Cues for correct hand placement Toileting- Clothing Manipulation and Hygiene: Minimal assistance;Sit to/from stand   Tub/ Shower Transfer: Minimal assistance;Rolling walker;Ambulation;Grab bars;3 in 1   Functional mobility during ADLs: Minimal assistance;Rolling walker;Moderate assistance                                        Cognition   Behavior During Therapy: WFL for tasks assessed/performed Overall Cognitive Status: Within Functional Limits for tasks assessed                                     General Comments  pt pleasant and cooperative, family very supportive    Pertinent Vitals/ Pain       Pain Assessment: Faces Faces Pain Scale: Hurts little more Pain Location: L LE with mobility Pain Descriptors / Indicators: Discomfort;Sore Pain Intervention(s): Limited activity within patient's tolerance;Monitored during session;Premedicated before session;Repositioned  Frequency  Min 3X/week        Progress Toward Goals  OT Goals(current goals can now be found in the care plan section)  Progress towards OT goals: Progressing toward goals  Acute Rehab OT Goals Patient Stated Goal: get stronger OT Goal Formulation: With patient/family  Plan Discharge plan remains appropriate                     End of Session Equipment Utilized During Treatment: Gait belt;Rolling walker;Other (comment) (3 in 1)  OT Visit Diagnosis: Unsteadiness on feet (R26.81);Muscle weakness  (generalized) (M62.81);Pain Pain - Right/Left: Left Pain - part of body: Leg   Activity Tolerance Patient tolerated treatment well   Patient Left with call bell/phone within reach;with family/visitor present;in bed (sitting EOB)   Nurse Communication      Functional Assessment Tool Used: AM-PAC 6 Clicks Daily Activity   Time: 1478-2956 OT Time Calculation (min): 32 min  Charges: OT G-codes **NOT FOR INPATIENT CLASS** Functional Assessment Tool Used: AM-PAC 6 Clicks Daily Activity OT General Charges $OT Visit: 1 Procedure OT Treatments $Self Care/Home Management : 8-22 mins $Therapeutic Activity: 8-22 mins     Galen Manila 06/10/2016, 12:37 PM

## 2016-06-10 NOTE — Progress Notes (Addendum)
Progress Note  Patient Name: Keyante Mulhall Date of Encounter: 06/10/2016  Primary Cardiologist: Dr. Tresa EndoKelly   Subjective   Feeling a bit stronger each day. Working with physical therapy. Just completed a session this am and it went well. No CP or dyspnea. LEE is improved. We discussed plans for cath. He states he was told by internal medicine doctor that cath should be delayed further until he is more stable/ physically stronger from a medical standpoint.   Inpatient Medications    Scheduled Meds: . aspirin EC  81 mg Oral Daily  . enoxaparin (LOVENOX) injection  40 mg Subcutaneous Q24H  . furosemide  40 mg Intravenous BID  . hydrocortisone  25 mg Rectal BID  . hydrocortisone-pramoxine  1 applicator Rectal Once  . levothyroxine  175 mcg Oral QAC breakfast  . magnesium hydroxide  30 mL Oral BID  . methylPREDNISolone (SOLU-MEDROL) injection  60 mg Intravenous Daily  . sodium chloride flush  3 mL Intravenous Q12H  . witch hazel-glycerin   Topical TID   Continuous Infusions:  PRN Meds: lidocaine, magnesium hydroxide, ondansetron **OR** ondansetron (ZOFRAN) IV, oxyCODONE, traZODone   Vital Signs    Vitals:   06/09/16 0629 06/09/16 1700 06/09/16 2033 06/10/16 0515  BP: 99/60 (!) 101/59 (!) 97/54 (!) 99/59  Pulse: 71 70 68 67  Resp: 20  18 18   Temp: 98 F (36.7 C) 98 F (36.7 C) 97.8 F (36.6 C) 98 F (36.7 C)  TempSrc: Oral Oral Oral Oral  SpO2:  98% 97% 97%  Weight:    177 lb (80.3 kg)  Height:        Intake/Output Summary (Last 24 hours) at 06/10/16 0953 Last data filed at 06/10/16 0516  Gross per 24 hour  Intake                0 ml  Output             2200 ml  Net            -2200 ml   Filed Weights   06/07/16 0553 06/09/16 0500 06/10/16 0515  Weight: 181 lb (82.1 kg) 177 lb 4.8 oz (80.4 kg) 177 lb (80.3 kg)    Telemetry    NSR - Personally Reviewed  ECG    NSr - Personally Reviewed  Physical Exam   GEN: No acute distress.   Neck: No JVD Cardiac:  RRR, no murmurs, rubs, or gallops.  Respiratory: bibasilar crackles. GI: Soft, nontender, non-distended  MS: trace bilateral LEE edema; No deformity. Neuro:  Nonfocal  Psych: Normal affect   Labs    Chemistry Recent Labs Lab 06/08/16 0200 06/09/16 0215 06/10/16 0217  NA 129* 131* 130*  K 4.7 4.2 4.0  CL 96* 97* 95*  CO2 25 27 27   GLUCOSE 105* 102* 103*  BUN 18 17 15   CREATININE 0.59* 0.68 0.66  CALCIUM 8.4* 8.5* 8.3*  PROT 5.2* 5.0* 4.8*  ALBUMIN 2.7* 2.6* 2.5*  AST 129* 100* 88*  ALT 109* 97* 91*  ALKPHOS 50 49 45  BILITOT 0.4 0.5 0.4  GFRNONAA >60 >60 >60  GFRAA >60 >60 >60  ANIONGAP 8 7 8      Hematology Recent Labs Lab 06/04/16 0343 06/05/16 0345 06/06/16 0350  WBC 8.6 7.2 7.1  RBC 3.74* 3.60* 3.65*  HGB 10.7* 10.3* 10.4*  HCT 32.8* 31.7* 32.1*  MCV 87.7 88.1 87.9  MCH 28.6 28.6 28.5  MCHC 32.6 32.5 32.4  RDW 14.8 14.9 14.8  PLT  281 261 241    Cardiac EnzymesNo results for input(s): TROPONINI in the last 168 hours. No results for input(s): TROPIPOC in the last 168 hours.   BNP Recent Labs Lab 06/05/16 0345  BNP 1,155.7*     DDimer No results for input(s): DDIMER in the last 168 hours.   Radiology    No results found.  Cardiac Studies   Echo 06/01/2016:  Study Conclusions - Left ventricle: Septal apical inferior and distal anterior wall hypokinesis The cavity size was severely dilated. Wall thickness was normal. Systolic function was severely reduced. The estimated ejection fraction was in the range of25% to 30%. Left ventricular diastolic function parameters were normal. - Aortic valve: There was mild regurgitation. - Mitral valve: There was mild regurgitation. - Left atrium: The appendage was moderately to severely dilated. - Atrial septum: No defect or patent foramen ovale was identified. - Pericardium, extracardiac: Small to moderate posterior lateral pericardial effusion no tamponade Moderate left pleural  effusion.  Patient Profile     56 y.o. male with past medical history of Bell's Palsy in 64. He was recently diagnosed with hypothyroidism a few months ago with TSH reportedly >90 and his PCP started him on synthroid. He has elevated CK's and transaminase, marked muscle weakness. We have been asked to consult for new LV dysfunction with EF 25-30%.  Assessment & Plan    1. Acute systolic and diastolic dysfunction. -EF 25-30%, down from 50-55% on 05/22/2016 at [facility]  -CXR: Moderately large bilateral pleural effusions. s/p Rt effusion tap - 800cc (06/04/16) and s/p left effusion tap - 500cc (06/05/16) -In the future, needsright and left cardiac catheterization for further evaluation of new LV dysfunction to assess for ischemic cause of dysfunctionespecially with diffuse coronary calcification noted. May need a biopsy to investigate infiltrative process. ? If also secondary to his hypothryoidism. We will plan cath once stable from a medical standpoint - Volume improved. Diuresing well. Net negative 13.3L with 2.9 L urine output yesterday; wt 177 lbs (184 lbs on admission). Still with trace bilateral LEE and pulmonic rales on exam.  - Continue diuresis with 40 mg IV lasix BID - BP remains marginal; continue to hold coreg - He states he is not having SOB or trouble breathing, on room air. Remains a 2 person assist to reposition or sit up in bed. Progressing well with PT.  2. Hyponatremia - Likely from fluid overload. - Continue with Lasix - Na 130 (129>>131>>130); asymptomatic   3. CAD -Significant coronary calcification noted on CT imaging. -Plan for right and left heart cath once fluid status improved.    4. Rhabdomyolysis/ polymyositis.  -CPK 5,482-->2716 --> 2005. Has been elevated for several weeks. Continues to trend down  -Profound muscle weakness and pain -Was given IV fluids until found to have severe LV dysfunction and IV fluids stopped. -steroids started  06/06/16   5. Hypothyroidism/myxedema -TSH was >90 when diagnosed a month or so ago. Started on synthroid. Was 36.989 on 05/12/2016 -05/31/2016 TSH 15.602 -Managed by IM. Levothyroxine 175 mcg daily   6. Constipation -with hemorrhoids, better today   7. Elevated liver enzymes - In part, may be secondary to hepatic congestion - AST and ALT continue to trend down  Signed, Robbie Lis, PA-C  06/10/2016, 9:53 AM    Personally seen and examined. Agree with above. 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 Edema has improved. We are significantly diuresing him. He is not complaining of any shortness of  breath. Proximal muscle weakness.  His IV has come out. He was wondering if we could switch to by mouth Lasix. This is fine. He was getting 40 mg IV twice a day of Lasix and still diuresing well. I will change him to 80 mg by mouth twice a day of Lasix. If he does not diurese well, we would need to consider replacing IV.  Donato Schultz, MD

## 2016-06-10 NOTE — Progress Notes (Signed)
Removed peripheral IV per patient request (painful). Called IV team for difficult stick. One attempt made.  Patient does not want another IV. Dr. Anne FuSkains and Dr. Jerral RalphGhimire made aware.  IV lasix changed to po for now.  Pt resting with call bell within reach.  Will continue to monitor. Connor Vargas, Connor Lamison McClintock, RN

## 2016-06-10 NOTE — Discharge Summary (Signed)
PATIENT DETAILS Name: Connor Vargas Age: 56 y.o. Sex: male Date of Birth: Oct 08, 1960 MRN: [id number]. Admitting Physician: Dorothea Ogle, MD WUJ:[id number],NWGNFA Joelene Millin, MD  Admit Date: 05/31/2016 Discharge date: 06/10/2016  Recommendations for Outpatient Follow-up:  1. Follow up with PCP in 1-2 weeks 2. Please obtain BMP/CBC periodically, remains on Lasix 80 mg twice a day 3. Please follow CK periodically, ensure it is down trending. 4. 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 5. Please ensure patient follows with rheumatology-see below regarding appointment. 6. Will also need outpatient follow-up with cardiology, if volume becomes an issue while at CIR-please consult [facility]  Admitted From:  Home  Disposition: [facility]   Home Health: No  Equipment/Devices: None  Discharge Condition: Stable  CODE STATUS: FULL CODE  Diet recommendation:  Heart Healthy   Brief Summary: See H&P, Labs, Consult and Test reports for all details in brief, Patient is a 57 y.o. 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 Patient was placed on Synthroid and subsequently discharged home. 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 See below for further details  Brief Hospital Course: Proximal muscle weakness: Suspicion for polymyositis, although could still have a myopathy related to hypothyroidism. 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 He still has some evidence of proximal weakness on exam, still requiring  some assistance to stand from a sitting position. Will need a muscle biopsy at some point, Dr. Izola Price had spoken to patient's rheumatologist at [facility],recommended to hold off on muscle biopsy. Patient lost IV access today and does not want IV to be put back, even if clinical improvement, we will transition to prednisone. Discussed with patient in great detail, that he will require age-appropriate cancer screening-if this turns out to be polymyositis. 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 Much more compensated with Lasix. Cardiology followed closely while inpatient. Patient not keen on pursuing a LHC at this time.  Bilateral pleural effusion: Unable to calculate light's criteria-but suspect this is a transudate-likely related to CHF.  Hypothyroidism: Recently admitted to [facility] in early February for myxedema without coma, started on Synthroid-his TSH has now come down to 15. Not sure if proximal muscle weakness is related to hypothyroidism, or he has polymyositis. See above regarding muscle biopsy.  Hyponatremia: Probably secondary to hypervolemia and hypothyroidism. Improving with diuretic therapy.  Minimally elevated LFTs: Likely related to myopathy. Follow periodically.  Normocytic Anemia: Likely related to chronic disease. Follow.  Hemorrhoids: Continue supportive care with Anusol.  Procedures/Studies: Echo 2/26>> - Left ventricle: Septal apical inferior and distal anterior wall hypokinesis The cavity size was severely dilated. Wall thickness was normal. Systolic function was severely reduced. The estimated ejection fraction was in the range of 25% to 30%. Left ventricular diastolic function parameters were normal. - Aortic valve: There was mild regurgitation. - Mitral valve: There was mild regurgitation. - Left atrium: The appendage was moderately to severely dilated. -  Atrial septum: No defect or patent foramen ovale was identified. - Pericardium, extracardiac: Small to moderate posterior lateral pericardial effusion no tamponade Moderate left  pleural effusion.  Discharge Diagnoses:  Active Problems:   CAP (community acquired pneumonia)   Weakness   External hemorrhoids   Pulmonary vascular congestion   Acquired hypothyroidism   Non-traumatic rhabdomyolysis   Transaminitis   Hyponatremia   Hypoalbuminemia   Elevated troponin   Myxedema   Cardiomyopathy (HCC)   Coronary artery disease involving native coronary artery of native heart without angina pectoris   Acute systolic congestive heart failure (HCC)   Polymyositis (HCC)   Discharge Instructions:  Activity:  As tolerated with Full fall precautions use walker/cane & assistance as needed  Discharge Instructions    (HEART FAILURE PATIENTS) Call MD:  Anytime you have any of the following symptoms: 1) 3 pound weight gain in 24 hours or 5 pounds in 1 week 2) shortness of breath, with or without a dry hacking cough 3) swelling in the hands, feet or stomach 4) if you have to sleep on extra pillows at night in order to breathe.    Complete by:  As directed    Diet - low sodium heart healthy    Complete by:  As directed    Increase activity slowly    Complete by:  As directed      Allergies as of 06/10/2016   No Known Allergies     Medication List    STOP taking these medications   levofloxacin 750 MG tablet Commonly known as:  LEVAQUIN     TAKE these medications   aspirin EC 81 MG tablet Take 81 mg by mouth daily.   docusate sodium 100 MG capsule Commonly known as:  COLACE Take 1 capsule (100 mg total) by mouth 2 (two) times daily.   furosemide 80 MG tablet Commonly known as:  LASIX Take 1 tablet (80 mg total) by mouth 2 (two) times daily.   hydrocortisone 25 MG suppository Commonly known as:  ANUSOL-HC Place 1 suppository (25 mg total) rectally 2 (two) times daily.     levothyroxine 175 MCG tablet Commonly known as:  SYNTHROID, LEVOTHROID Take 175 mcg by mouth daily before breakfast.   pantoprazole 40 MG tablet Commonly known as:  PROTONIX Take 1 tablet (40 mg total) by mouth daily at 12 noon.   predniSONE 20 MG tablet Commonly known as:  DELTASONE Take 3 tablets (60 mg total) by mouth daily with breakfast.   senna 8.6 MG tablet Commonly known as:  SENOKOT Take 1 tablet by mouth daily.   traMADol 50 MG tablet Commonly known as:  ULTRAM Take 50 mg by mouth every 6 (six) hours as needed for moderate pain.      Follow-up Information    ZIOLKOWSKA,ALDONA, MD Follow up on 07/03/2016.   Specialty:  Internal Medicine Why:  appointment scheduled at 3:30 pm Contact information: [address] [place] [address] [phone]        Tally Joe, MD Follow up.   Contact information: [address] [place] [address] [phone]          No Known Allergies  Consultations: Cardiology and general surgery   Other Procedures/Studies: Dg Chest 1 View  Result Date: 06/05/2016 CLINICAL DATA:  Followup left thoracentesis EXAM: CHEST 1 VIEW COMPARISON:  06/04/2016 FINDINGS: No pneumothorax. There is probably less pleural fluid on the left. Small effusions persist with basilar atelectasis. There is venous hypertension with interstitial edema. IMPRESSION: Less pleural fluid on the left. No pneumothorax following thoracentesis. Persistent basilar atelectasis and mild edema. Electronically Signed   By: Loraine Leriche  Shogry M.D.   On: 06/05/2016 16:20   Dg Chest 1 View  Result Date: 06/04/2016 CLINICAL DATA:  Right-sided thoracentesis EXAM: CHEST 1 VIEW COMPARISON:  CT chest of 06/03/2016 FINDINGS: Much of the right pleural effusion has been evacuated by right thoracentesis. No pneumothorax is seen. A small left effusion is present with bibasilar atelectasis left-greater-than-right. Cardiomegaly is stable.  IMPRESSION: Much of the right pleural effusion has been evacuated by right thoracentesis. No pneumothorax is seen. Electronically Signed   By: Dwyane DeePaul  Barry M.D.   On: 06/04/2016 10:21   Dg Chest 2 View  Result Date: 05/31/2016 CLINICAL DATA:  Generalized weakness.  Shortness of breath. EXAM: CHEST  2 VIEW COMPARISON:  One-view chest x-ray 04/13/2015 FINDINGS: The heart is enlarged. Atherosclerotic calcifications are present at the aortic arch. Diffuse interstitial and airspace disease is present. Bilateral effusions are present, left greater than right. Left greater than right basilar airspace disease is present. IMPRESSION: 1. Cardiomegaly with interstitial and airspace disease likely reflecting edema and bilateral effusions suggesting congestive heart failure. 2. Left greater than right pleural effusions and airspace disease. While this likely reflects atelectasis, infection is also considered. Electronically Signed   By: Marin Robertshristopher  Mattern M.D.   On: 05/31/2016 14:38   Ct Chest Wo Contrast  Result Date: 06/03/2016 CLINICAL DATA:  Pleural effusions.  Dyspnea. EXAM: CT CHEST WITHOUT CONTRAST TECHNIQUE: Multidetector CT imaging of the chest was performed following the standard protocol without IV contrast. COMPARISON:  CT chest without contrast 05/31/2016 FINDINGS: Cardiovascular: The heart size is normal. Dense coronary artery calcifications are present. The small pericardial effusion is stable. The aorta and pulmonary arteries are unremarkable. Mediastinum/Nodes: Subcentimeter mediastinal lymph nodes are stable. No significant axillary adenopathy is present. Lungs/Pleura: Moderate size pleural effusions are similar the prior study. Dependent airspace disease bilaterally is not significantly changed. There is no central obstructing mass. There is a ground-glass attenuation are present as well. High-density material is again noted within the lower lobe bronchi bilaterally Upper Abdomen: Gallstones are  present. No significant inflammatory changes are present to suggest cholecystitis. Limited imaging of the abdomen is otherwise unremarkable. Musculoskeletal: Vertebral body heights and alignment are maintained. No focal lytic or blastic lesions are present. IMPRESSION: 1. No significant interval change in moderate bilateral pleural effusions and bibasilar airspace disease. While this likely reflects atelectasis, infection is not excluded. 2. Stable small pericardial effusion. 3. Atherosclerotic disease including dense coronary artery disease. 4. High-density material within the lower lobe bronchi remains concerning for aspiration. Electronically Signed   By: Marin Robertshristopher  Mattern M.D.   On: 06/03/2016 14:24   Ct Chest Wo Contrast  Result Date: 05/31/2016 CLINICAL DATA:  Dyspnea EXAM: CT CHEST WITHOUT CONTRAST TECHNIQUE: Multidetector CT imaging of the chest was performed following the standard protocol without IV contrast. COMPARISON:  Chest x-ray 05/31/2016 FINDINGS: Cardiovascular: Limited vascular evaluation without intravenous contrast. Negative for aortic aneurysm. Mild atherosclerotic disease aortic arch. Extensive calcification in the left main and left anterior descending coronary artery and in the circumflex. Right coronary calcification. Heart size normal. Small pericardial effusion. Mediastinum/Nodes: No mass or adenopathy Lungs/Pleura: Moderate pleural effusion bilaterally. Bibasilar airspace disease may represent pneumonia or atelectasis. There is dense material in the lower lobe airways bilaterally which is likely aspirated material. No high-density material in the stomach. Upper lobes relatively clear. Upper Abdomen: Negative Musculoskeletal: Negative IMPRESSION: Moderately large bilateral pleural effusions. Small pericardial effusion Extensive coronary artery disease with heavy calcification left main and left coronary artery. Right coronary artery also calcified Bibasilar  airspace disease which  may be atelectasis or infiltrate. There is high-density material in the lower lobe airway bilaterally which is likely aspirated material, possibly barium or other ingested material. No high-density material is seen in the stomach at this time. Electronically Signed   By: Marlan Palau M.D.   On: 05/31/2016 21:21   US Thoracentesis Asp Pleural Space W/img Guide  Result Date: 06/05/2016 INDICATION: Symptomatic left sided pleural effusion EXAM: US THORACENTESIS ASP PLEURAL SPACE W/IMG GUIDE COMPARISON:  None. MEDICATIONS: 10 cc 1% lidocaine COMPLICATIONS: None immediate. TECHNIQUE: Informed written consent was obtained from the patient after a discussion of the risks, benefits and alternatives to treatment. A timeout was performed prior to the initiation of the procedure. Initial ultrasound scanning demonstrates a left pleural effusion. The lower chest was prepped and draped in the usual sterile fashion. 1% lidocaine was used for local anesthesia. Under direct ultrasound guidance, a 19 gauge, 7-cm, Yueh catheter was introduced. An ultrasound image was saved for documentation purposes. The thoracentesis was performed. The catheter was removed and a dressing was applied. The patient tolerated the procedure well without immediate post procedural complication. The patient was escorted to have an upright chest radiograph. FINDINGS: A total of approximately 500 cc of clear yellow fluid was removed. IMPRESSION: Successful ultrasound-guided left sided thoracentesis yielding 0.5 liters of pleural fluid. Read by Robet Leu [facility] Electronically Signed   By: Corlis Leak M.D.   On: 06/05/2016 15:48   US Thoracentesis Asp Pleural Space W/img Guide  Result Date: 06/04/2016 INDICATION: New onset decompensated systolic congestive heart with an ejection fraction of 25% and new bilateral pleural effusions. Request is made for diagnostic and therapeutic thoracentesis. EXAM: ULTRASOUND GUIDED DIAGNOSTIC AND THERAPEUTIC  THORACENTESIS MEDICATIONS: 1% lidocaine. COMPLICATIONS: None immediate. PROCEDURE: An ultrasound guided thoracentesis was thoroughly discussed with the patient and questions answered. The benefits, risks, alternatives and complications were also discussed. The patient understands and wishes to proceed with the procedure. Written consent was obtained. Ultrasound was performed to localize and mark an adequate pocket of fluid in the right chest. The area was then prepped and draped in the normal sterile fashion. 1% Lidocaine was used for local anesthesia. Under ultrasound guidance a Safe-T-Centesis catheter was introduced. Thoracentesis was performed. The catheter was removed and a dressing applied. FINDINGS: A total of approximately 0.8 L of yellow fluid was removed. Samples were sent to the laboratory as requested by the clinical team. IMPRESSION: Successful ultrasound guided right thoracentesis yielding 0.8 L of pleural fluid. Read by: Barnetta Chapel, PA-C Electronically Signed   By: Simonne Come M.D.   On: 06/04/2016 14:12      TODAY-DAY OF DISCHARGE:  Subjective:   Connor Vargas today has no headache,no chest abdominal pain,no new weakness tingling or numbness, feels much better  Objective:   Blood pressure (!) 99/59, pulse 67, temperature 98 F (36.7 C), temperature source Oral, resp. rate 18, height 6\' 1"  (1.854 m), weight 80.3 kg (177 lb), SpO2 97 %.  Intake/Output Summary (Last 24 hours) at 06/10/16 1624 Last data filed at 06/10/16 0516  Gross per 24 hour  Intake                0 ml  Output             1250 ml  Net            -1250 ml   Filed Weights   06/07/16 0553 06/09/16 0500 06/10/16 0515  Weight: 82.1 kg (181 lb)  80.4 kg (177 lb 4.8 oz) 80.3 kg (177 lb)    Exam: General appearance :Awake, alert, not in any distress. Speech Clear. Eyes:, pupils equally reactive to light and accomodation,no scleral icterus HEENT: Atraumatic and Normocephalic Neck: supple, no JVD. No cervical  lymphadenopathy.  Resp:Good air entry bilaterally, no added sounds  CVS: S1 S2 regular, no murmurs.  GI: Bowel sounds present, Non tender and not distended with no gaurding, rigidity or rebound. Extremities: B/L Lower Ext shows no edema, both legs are warm to touch Neurology:  speech clear,Non focal-However has some proximal hip girdle and shoulder girdle muscle weakness-although improved Psychiatric: Normal judgment and insight. Alert and oriented x 3.. Musculoskeletal:No digital cyanosis Skin: Coarse skin in his hands Wounds:N/A   PERTINENT RADIOLOGIC STUDIES: Dg Chest 1 View  Result Date: 06/05/2016 CLINICAL DATA:  Followup left thoracentesis EXAM: CHEST 1 VIEW COMPARISON:  06/04/2016 FINDINGS: No pneumothorax. There is probably less pleural fluid on the left. Small effusions persist with basilar atelectasis. There is venous hypertension with interstitial edema. IMPRESSION: Less pleural fluid on the left. No pneumothorax following thoracentesis. Persistent basilar atelectasis and mild edema. Electronically Signed   By: Paulina Fusi M.D.   On: 06/05/2016 16:20   Dg Chest 1 View  Result Date: 06/04/2016 CLINICAL DATA:  Right-sided thoracentesis EXAM: CHEST 1 VIEW COMPARISON:  CT chest of 06/03/2016 FINDINGS: Much of the right pleural effusion has been evacuated by right thoracentesis. No pneumothorax is seen. A small left effusion is present with bibasilar atelectasis left-greater-than-right. Cardiomegaly is stable. IMPRESSION: Much of the right pleural effusion has been evacuated by right thoracentesis. No pneumothorax is seen. Electronically Signed   By: Dwyane Dee M.D.   On: 06/04/2016 10:21   Dg Chest 2 View  Result Date: 05/31/2016 CLINICAL DATA:  Generalized weakness.  Shortness of breath. EXAM: CHEST  2 VIEW COMPARISON:  One-view chest x-ray 04/13/2015 FINDINGS: The heart is enlarged. Atherosclerotic calcifications are present at the aortic arch. Diffuse interstitial and airspace  disease is present. Bilateral effusions are present, left greater than right. Left greater than right basilar airspace disease is present. IMPRESSION: 1. Cardiomegaly with interstitial and airspace disease likely reflecting edema and bilateral effusions suggesting congestive heart failure. 2. Left greater than right pleural effusions and airspace disease. While this likely reflects atelectasis, infection is also considered. Electronically Signed   By: Marin Roberts M.D.   On: 05/31/2016 14:38   Ct Chest Wo Contrast  Result Date: 06/03/2016 CLINICAL DATA:  Pleural effusions.  Dyspnea. EXAM: CT CHEST WITHOUT CONTRAST TECHNIQUE: Multidetector CT imaging of the chest was performed following the standard protocol without IV contrast. COMPARISON:  CT chest without contrast 05/31/2016 FINDINGS: Cardiovascular: The heart size is normal. Dense coronary artery calcifications are present. The small pericardial effusion is stable. The aorta and pulmonary arteries are unremarkable. Mediastinum/Nodes: Subcentimeter mediastinal lymph nodes are stable. No significant axillary adenopathy is present. Lungs/Pleura: Moderate size pleural effusions are similar the prior study. Dependent airspace disease bilaterally is not significantly changed. There is no central obstructing mass. There is a ground-glass attenuation are present as well. High-density material is again noted within the lower lobe bronchi bilaterally Upper Abdomen: Gallstones are present. No significant inflammatory changes are present to suggest cholecystitis. Limited imaging of the abdomen is otherwise unremarkable. Musculoskeletal: Vertebral body heights and alignment are maintained. No focal lytic or blastic lesions are present. IMPRESSION: 1. No significant interval change in moderate bilateral pleural effusions and bibasilar airspace disease. While this likely reflects atelectasis, infection  is not excluded. 2. Stable small pericardial effusion. 3.  Atherosclerotic disease including dense coronary artery disease. 4. High-density material within the lower lobe bronchi remains concerning for aspiration. Electronically Signed   By: Marin Roberts M.D.   On: 06/03/2016 14:24   Ct Chest Wo Contrast  Result Date: 05/31/2016 CLINICAL DATA:  Dyspnea EXAM: CT CHEST WITHOUT CONTRAST TECHNIQUE: Multidetector CT imaging of the chest was performed following the standard protocol without IV contrast. COMPARISON:  Chest x-ray 05/31/2016 FINDINGS: Cardiovascular: Limited vascular evaluation without intravenous contrast. Negative for aortic aneurysm. Mild atherosclerotic disease aortic arch. Extensive calcification in the left main and left anterior descending coronary artery and in the circumflex. Right coronary calcification. Heart size normal. Small pericardial effusion. Mediastinum/Nodes: No mass or adenopathy Lungs/Pleura: Moderate pleural effusion bilaterally. Bibasilar airspace disease may represent pneumonia or atelectasis. There is dense material in the lower lobe airways bilaterally which is likely aspirated material. No high-density material in the stomach. Upper lobes relatively clear. Upper Abdomen: Negative Musculoskeletal: Negative IMPRESSION: Moderately large bilateral pleural effusions. Small pericardial effusion Extensive coronary artery disease with heavy calcification left main and left coronary artery. Right coronary artery also calcified Bibasilar airspace disease which may be atelectasis or infiltrate. There is high-density material in the lower lobe airway bilaterally which is likely aspirated material, possibly barium or other ingested material. No high-density material is seen in the stomach at this time. Electronically Signed   By: Marlan Palau M.D.   On: 05/31/2016 21:21   US Thoracentesis Asp Pleural Space W/img Guide  Result Date: 06/05/2016 INDICATION: Symptomatic left sided pleural effusion EXAM: US THORACENTESIS ASP PLEURAL SPACE  W/IMG GUIDE COMPARISON:  None. MEDICATIONS: 10 cc 1% lidocaine COMPLICATIONS: None immediate. TECHNIQUE: Informed written consent was obtained from the patient after a discussion of the risks, benefits and alternatives to treatment. A timeout was performed prior to the initiation of the procedure. Initial ultrasound scanning demonstrates a left pleural effusion. The lower chest was prepped and draped in the usual sterile fashion. 1% lidocaine was used for local anesthesia. Under direct ultrasound guidance, a 19 gauge, 7-cm, Yueh catheter was introduced. An ultrasound image was saved for documentation purposes. The thoracentesis was performed. The catheter was removed and a dressing was applied. The patient tolerated the procedure well without immediate post procedural complication. The patient was escorted to have an upright chest radiograph. FINDINGS: A total of approximately 500 cc of clear yellow fluid was removed. IMPRESSION: Successful ultrasound-guided left sided thoracentesis yielding 0.5 liters of pleural fluid. Read by Robet Leu [facility] Electronically Signed   By: Corlis Leak M.D.   On: 06/05/2016 15:48   US Thoracentesis Asp Pleural Space W/img Guide  Result Date: 06/04/2016 INDICATION: New onset decompensated systolic congestive heart with an ejection fraction of 25% and new bilateral pleural effusions. Request is made for diagnostic and therapeutic thoracentesis. EXAM: ULTRASOUND GUIDED DIAGNOSTIC AND THERAPEUTIC THORACENTESIS MEDICATIONS: 1% lidocaine. COMPLICATIONS: None immediate. PROCEDURE: An ultrasound guided thoracentesis was thoroughly discussed with the patient and questions answered. The benefits, risks, alternatives and complications were also discussed. The patient understands and wishes to proceed with the procedure. Written consent was obtained. Ultrasound was performed to localize and mark an adequate pocket of fluid in the right chest. The area was then prepped and draped in the  normal sterile fashion. 1% Lidocaine was used for local anesthesia. Under ultrasound guidance a Safe-T-Centesis catheter was introduced. Thoracentesis was performed. The catheter was removed and a dressing applied. FINDINGS: A total of approximately  0.8 L of yellow fluid was removed. Samples were sent to the laboratory as requested by the clinical team. IMPRESSION: Successful ultrasound guided right thoracentesis yielding 0.8 L of pleural fluid. Read by: Barnetta Chapel, PA-C Electronically Signed   By: Simonne Come M.D.   On: 06/04/2016 14:12     PERTINENT LAB RESULTS: CBC: No results for input(s): WBC, HGB, HCT, PLT in the last 72 hours. CMET CMP     Component Value Date/Time   NA 130 (L) 06/10/2016 0217   K 4.0 06/10/2016 0217   CL 95 (L) 06/10/2016 0217   CO2 27 06/10/2016 0217   GLUCOSE 103 (H) 06/10/2016 0217   BUN 15 06/10/2016 0217   CREATININE 0.66 06/10/2016 0217   CALCIUM 8.3 (L) 06/10/2016 0217   PROT 4.8 (L) 06/10/2016 0217   ALBUMIN 2.5 (L) 06/10/2016 0217   AST 88 (H) 06/10/2016 0217   ALT 91 (H) 06/10/2016 0217   ALKPHOS 45 06/10/2016 0217   BILITOT 0.4 06/10/2016 0217   GFRNONAA >60 06/10/2016 0217   GFRAA >60 06/10/2016 0217    GFR Estimated Creatinine Clearance: 115.1 mL/min (by C-G formula based on SCr of 0.66 mg/dL). No results for input(s): LIPASE, AMYLASE in the last 72 hours.  Recent Labs  06/08/16 0200 06/09/16 0215 06/10/16 0217  CKTOTAL 2,716* 2,005* 1,736*   Invalid input(s): POCBNP No results for input(s): DDIMER in the last 72 hours. No results for input(s): HGBA1C in the last 72 hours. No results for input(s): CHOL, HDL, LDLCALC, TRIG, CHOLHDL, LDLDIRECT in the last 72 hours. No results for input(s): TSH, T4TOTAL, T3FREE, THYROIDAB in the last 72 hours.  Invalid input(s): FREET3 No results for input(s): VITAMINB12, FOLATE, FERRITIN, TIBC, IRON, RETICCTPCT in the last 72 hours. Coags: No results for input(s): INR in the last 72  hours.  Invalid input(s): PT Microbiology: Recent Results (from the past 240 hour(s))  Culture, body fluid-bottle     Status: None   Collection Time: 06/04/16 11:47 AM  Result Value Ref Range Status   Specimen Description PLEURAL RIGHT  Final   Special Requests NONE  Final   Culture NO GROWTH 5 DAYS  Final   Report Status 06/09/2016 FINAL  Final  Gram stain     Status: None   Collection Time: 06/04/16 11:47 AM  Result Value Ref Range Status   Specimen Description PLEURAL RIGHT  Final   Special Requests NONE  Final   Gram Stain   Final    FEW WBC PRESENT, PREDOMINANTLY PMN NO ORGANISMS SEEN    Report Status 06/04/2016 FINAL  Final    FURTHER DISCHARGE INSTRUCTIONS:  Get Medicines reviewed and adjusted: Please take all your medications with you for your next visit with your Primary MD  Laboratory/radiological data: Please request your Primary MD to go over all hospital tests and procedure/radiological results at the follow up, please ask your Primary MD to get all Hospital records sent to his/her office.  In some cases, they will be blood work, cultures and biopsy results pending at the time of your discharge. Please request that your primary care M.D. goes through all the records of your hospital data and follows up on these results.  Also Note the following: If you experience worsening of your admission symptoms, develop shortness of breath, life threatening emergency, suicidal or homicidal thoughts you must seek medical attention immediately by calling 911 or calling your MD immediately  if symptoms less severe.  You must read complete instructions/literature along with all the  possible adverse reactions/side effects for all the Medicines you take and that have been prescribed to you. Take any new Medicines after you have completely understood and accpet all the possible adverse reactions/side effects.   Do not drive when taking Pain medications or sleeping medications  (Benzodaizepines)  Do not take more than prescribed Pain, Sleep and Anxiety Medications. It is not advisable to combine anxiety,sleep and pain medications without talking with your primary care practitioner  Special Instructions: If you have smoked or chewed Tobacco  in the last 2 yrs please stop smoking, stop any regular Alcohol  and or any Recreational drug use.  Wear Seat belts while driving.  Please note: You were cared for by a hospitalist during your hospital stay. Once you are discharged, your primary care physician will handle any further medical issues. Please note that NO REFILLS for any discharge medications will be authorized once you are discharged, as it is imperative that you return to your primary care physician (or establish a relationship with a primary care physician if you do not have one) for your post hospital discharge needs so that they can reassess your need for medications and monitor your lab values.  Total Time spent coordinating discharge including counseling, education and face to face time equals 45 minutes.  SignedJeoffrey Massed 06/10/2016 4:24 PM

## 2016-06-10 NOTE — Progress Notes (Signed)
Physical Therapy Treatment Patient Details Name: Connor Vargas MRN: [id number] DOB: Feb 13, 1961 Today's Date: 06/10/2016    History of Present Illness  Connor Vargas is a 56 y.o. male admitted with weakness-- unclear etiology but appears to be multifactorial and secondary to hypothyroidism, rhabdomyolysis, acute pulmonary vascular congestion. He was recently diagnosed with hypothyroidism (> 90) and started him on synthroid with increase in dose but has continued to decline in terms of overall clinical state despite treatment.    PT Comments    Pt with improved overall strength however con't to demo significant proximal weakness, decreased activity tolerance, bilat LE edema and con't to require assist for all transfers and mobility. Pt was working 6am-10pm Monday through Saturday. Pt became emotional regarding is current physical condition but is motivated to get stronger and better. Pt was very eager to participate in exercises this morning and was able to tolerate ambulation after, however requires >10 standing rest breaks for 150' with RW. Con't to recommend CIR Upon d/c for aggressive rehab program for maximal functional recovery.   Follow Up Recommendations  CIR     Equipment Recommendations  Rolling walker with 5" wheels    Recommendations for Other Services Rehab consult     Precautions / Restrictions Precautions Precautions: Fall Restrictions Weight Bearing Restrictions: No    Mobility  Bed Mobility Overal bed mobility: Needs Assistance Bed Mobility: Rolling;Sidelying to Sit Rolling: Min assist Sidelying to sit: Min assist       General bed mobility comments: increased time, difficulty moving LEs due to proximimal weakness. labored effort to push trunk up from sidelying due to proximal weakness at shoulders  Transfers Overall transfer level: Needs assistance Equipment used: Rolling walker (2 wheeled) Transfers: Sit to/from Stand Sit to Stand: Mod assist          General transfer comment: v/c's to push up from bed, increased time, mild SOB  Ambulation/Gait Ambulation/Gait assistance: Min guard Ambulation Distance (Feet): 150 Feet Assistive device: Rolling walker (2 wheeled) Gait Pattern/deviations: Step-through pattern;Decreased stride length Gait velocity: decreased Gait velocity interpretation: <1.8 ft/sec, indicative of risk for recurrent falls General Gait Details: pt requiring >10 standing rest breaks due to SOB, onset of fatigue however pt determined to "make the loop"   Stairs            Wheelchair Mobility    Modified Rankin (Stroke Patients Only)       Balance Overall balance assessment: Needs assistance Sitting-balance support: Feet supported;No upper extremity supported Sitting balance-Leahy Scale: Good     Standing balance support: Bilateral upper extremity supported Standing balance-Leahy Scale: Poor Standing balance comment: requires RW for stability                    Cognition Arousal/Alertness: Awake/alert Behavior During Therapy: WFL for tasks assessed/performed Overall Cognitive Status: Within Functional Limits for tasks assessed                 General Comments: pt emotional about medical condition since he was working 6 days a week before and now can hardly walk    Exercises General Exercises - Lower Extremity Quad Sets: AROM;Both;10 reps;Supine Heel Slides: AAROM;Both;10 reps;Supine Other Exercises Other Exercises: bridges x 5 reps x 2 sets. very difficult    General Comments General comments (skin integrity, edema, etc.): bilat LEs ace wrapped from foot to knee for edema management, UEs with less edema, LEs remain to have edema      Pertinent Vitals/Pain Pain Assessment: Faces Faces Pain  Scale: Hurts little more Pain Location: L hip and LE Pain Descriptors / Indicators: Discomfort;Sore Pain Intervention(s): Limited activity within patient's tolerance    Home Living                       Prior Function            PT Goals (current goals can now be found in the care plan section) Acute Rehab PT Goals Patient Stated Goal: get stronger Progress towards PT goals: Progressing toward goals    Frequency    Min 3X/week      PT Plan Current plan remains appropriate    Co-evaluation             End of Session Equipment Utilized During Treatment: Gait belt Activity Tolerance: Patient limited by fatigue Patient left: in chair;with call bell/phone within reach;with family/visitor present Nurse Communication: Mobility status PT Visit Diagnosis: Difficulty in walking, not elsewhere classified (R26.2)     Time: [id number] PT Time Calculation (min) (ACUTE ONLY): 28 min  Charges:  $Gait Training: 8-22 mins $Therapeutic Exercise: 8-22 mins                    G Codes:       Quamaine Webb M Lecil Tapp 06/10/2016, 8:49 AM   Lewis ShockAshly Mianna Iezzi, PT, DPT Pager #: [phone] Office #: [phone]

## 2016-06-10 NOTE — H&P (Signed)
Physical Medicine and Rehabilitation Admission H&P    Chief Complaint  Patient presents with  . Weakness  : HPI: Connor Vargas is a 56 y.o. right handed male with history of recent diagnosed hypothyroidism with TSH a few months ago reportedly greater than 90. History taken from chart review and patient. Placed on hormone supplement and titrated but continued to decline with generalized weakness. He was sent to [facility] about 2 weeks prior was later discharged to home. He was scheduled to meet with a rheumatologist March 6. Presented 05/31/2016 with progressive weakness lower extremities as well as shortness of breath. At baseline patient was independent up until a few weeks ago working full time as a Nurse, adult for [employer]. He lives with his wife in a split-level home. Found to be hyponatremic 125, BNP 1328, TSH 15.602, CK 5482, sedimentation rate 15, troponin 0.59. CT of the chest without contrast showed moderately large bilateral pleural effusions.Underwent right effusion tap 06/04/2016 with 800 mL obtained and left effusion tap 06/05/2016 with a 500 mL yield Extensive coronary artery disease with heavy calcification left main and left coronary artery. CXR 3/2 reviewed, showing decreasing pleural effusion. Echocardiogram with ejection fraction of 30%. 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 Placed on intravenous Lasix. Cardiology services consulted and plan for right and left heart catheterization once fluid status improved and optimized. Patient remains on Synthroid as advised. 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 Rheumatology Dr. Maudie Mercury at [facility] was contacted and advised intravenous Solu-Medrol monitoring of CPK slow prednisone taper. Hold on muscle biopsy at this time until CK level stable due to high risk of muscle necrosis. Latest CK levels improved 1736.  Hyponatremia persist 129-130 likely from fluid overload and hypothyroidism. Placed on subcutaneous Lovenox for DVT prophylaxis. Tolerating a regular diet. Therapy evaluation completed 06/01/2016 with recommendations of physical medicine rehabilitation consult. Patient was admitted for a comprehensive rehabilitation program  Review of Systems  Constitutional: Positive for malaise/fatigue. Negative for chills and fever.  HENT: Negative for hearing loss and tinnitus.   Eyes: Negative for blurred vision and double vision.  Respiratory: Positive for shortness of breath. Negative for cough.   Cardiovascular: Positive for leg swelling. Negative for chest pain and palpitations.  Gastrointestinal: Positive for constipation. Negative for nausea and vomiting.  Genitourinary: Positive for urgency. Negative for dysuria and hematuria.  Musculoskeletal: Positive for joint pain and myalgias.  Skin: Negative for rash.  Neurological: Positive for weakness. Negative for seizures.  All other systems reviewed and are negative.  Past Medical History:  Diagnosis Date  . Bell's palsy    1980  . Thyroid disease    History reviewed. No pertinent surgical history. Family History  Problem Relation Age of Onset  . Coronary artery disease Mother 33   Social History:  reports that he has never smoked. He has never used smokeless tobacco. He reports that he drinks alcohol. He reports that he does not use drugs. Allergies: No Known Allergies Medications Prior to Admission  Medication Sig Dispense Refill  . aspirin EC 81 MG tablet Take 81 mg by mouth daily.    Marland Kitchen docusate sodium (COLACE) 100 MG capsule Take 1 capsule (100 mg total) by mouth 2 (two) times daily. 10 capsule 0  . levothyroxine (SYNTHROID, LEVOTHROID) 175 MCG tablet Take 175 mcg by mouth daily before breakfast.    . senna (SENOKOT) 8.6 MG tablet  Take 1 tablet by mouth daily.    . traMADol (ULTRAM) 50 MG tablet Take 50 mg by mouth every 6 (six) hours as  needed for moderate pain.    . hydrocortisone (ANUSOL-HC) 25 MG suppository Place 1 suppository (25 mg total) rectally 2 (two) times daily. (Patient not taking: Reported on 05/31/2016) 12 suppository 0  . levofloxacin (LEVAQUIN) 750 MG tablet Take 1 tablet (750 mg total) by mouth daily. (Patient not taking: Reported on 05/31/2016) 4 tablet 0    Home: Home Living Family/patient expects to be discharged to:: Private residence Living Arrangements: Spouse/significant other, Children Available Help at Discharge: Family, Available PRN/intermittently Type of Home: House Home Access: Level entry Home Layout: Multi-level Alternate Level Stairs-Number of Steps: 6-8 Alternate Level Stairs-Rails: Right Home Equipment: None   Functional History: Prior Function Level of Independence: Independent Comments: Works full time  Functional Status:  Mobility: Bed Mobility Overal bed mobility: Needs Assistance Bed Mobility: Rolling, Sidelying to Sit Rolling: Min assist Sidelying to sit: Min assist Supine to sit: Mod assist Sit to supine: Mod assist General bed mobility comments: increased time, difficulty moving LEs due to proximimal weakness. labored effort to push trunk up from sidelying due to proximal weakness at shoulders Transfers Overall transfer level: Needs assistance Equipment used: Rolling walker (2 wheeled) Transfers: Sit to/from Stand Sit to Stand: Mod assist General transfer comment: v/c's to push up from bed, increased time, mild SOB Ambulation/Gait Ambulation/Gait assistance: Min guard Ambulation Distance (Feet): 150 Feet Assistive device: Rolling walker (2 wheeled) Gait Pattern/deviations: Step-through pattern, Decreased stride length General Gait Details: pt requiring >10 standing rest breaks due to SOB, onset of fatigue however pt determined to "make the loop" Gait velocity: decreased Gait velocity interpretation: <1.8 ft/sec, indicative of risk for recurrent falls     ADL: ADL Overall ADL's : Needs assistance/impaired Eating/Feeding: Set up, Supervision/ safety, Sitting Eating/Feeding Details (indicate cue type and reason): increased time Grooming: Set up, Sitting Grooming Details (indicate cue type and reason): increased time Upper Body Bathing: Set up, Sitting Upper Body Bathing Details (indicate cue type and reason): increased time Lower Body Bathing: Moderate assistance, Sit to/from stand Lower Body Bathing Details (indicate cue type and reason): min A sit<>stand; increased time Upper Body Dressing : Set up, Supervision/safety, Sitting Upper Body Dressing Details (indicate cue type and reason): increased time Lower Body Dressing: Moderate assistance, Sit to/from stand Lower Body Dressing Details (indicate cue type and reason): A to lift feet off floor to donn socks/underwear over feet. Fearful of coming forward. unable to corss feet over knees due to "soreness and hip tightness" Toilet Transfer: Moderate assistance Toilet Transfer Details (indicate cue type and reason): to power up from lower surface. min A to powr up from higher surface. Toileting- Clothing Manipulation and Hygiene: Minimal assistance, Sitting/lateral lean Toileting - Clothing Manipulation Details (indicate cue type and reason): min A sit<>stand Functional mobility during ADLs: Minimal assistance, Rolling walker General ADL Comments: Proximal weakness makes LB ADL difficult. Pt also has weakness  in B shoulder girdles which makes UB dressing difficult. At times, pt demonstrates difficultywith  transitions anteriorly in sitting due to weak core muscles  Cognition: Cognition Overall Cognitive Status: Within Functional Limits for tasks assessed Orientation Level: Oriented X4 Cognition Arousal/Alertness: Awake/alert Behavior During Therapy: WFL for tasks assessed/performed Overall Cognitive Status: Within Functional Limits for tasks assessed General Comments: pt emotional about  medical condition since he was working 6 days a week before and now can hardly walk  Physical Exam: Blood  pressure (!) 99/59, pulse 67, temperature 98 F (36.7 C), temperature source Oral, resp. rate 18, height 6' 1"  (1.854 m), weight 80.3 kg (177 lb), SpO2 97 %. Physical Exam  Constitutional: He is oriented to person, place, and time. He appears well-developed and well-nourished.  HENT:  Head: Normocephalic and atraumatic.  Eyes: Conjunctivae and EOM are normal. Left eye exhibits no discharge.  Neck: Normal range of motion. Neck supple. No thyromegaly present.  Cardiovascular: Normal rate, regular rhythm and normal heart sounds.   Respiratory: Effort normal.  Decreased breath sounds at the bases but clear to auscultation  GI: Soft. Bowel sounds are normal. He exhibits no distension. There is no tenderness.  Musculoskeletal: He exhibits edema. He exhibits no tenderness.  Neurological: He is alert and oriented to person, place, and time.  Motor: B/l UE 4/5 proximally, 4+/5 distally RLE: HF 4/5, KE 4+/5, ADF/PF 4+/5 LLE: HF 4-/5, KE 4+/5, ADF/PF 4+/5 Sensation intact to light touch   Skin: Skin is warm and dry.  Psychiatric: He has a normal mood and affect. His behavior is normal.     Results for orders placed or performed during the hospital encounter of 05/31/16 (from the past 48 hour(s))  CK     Status: Abnormal   Collection Time: 06/09/16  2:15 AM  Result Value Ref Range   Total CK 2,005 (H) 49 - 397 U/L  Comprehensive metabolic panel     Status: Abnormal   Collection Time: 06/09/16  2:15 AM  Result Value Ref Range   Sodium 131 (L) 135 - 145 mmol/L   Potassium 4.2 3.5 - 5.1 mmol/L   Chloride 97 (L) 101 - 111 mmol/L   CO2 27 22 - 32 mmol/L   Glucose, Bld 102 (H) 65 - 99 mg/dL   BUN 17 6 - 20 mg/dL   Creatinine, Ser 0.68 0.61 - 1.24 mg/dL   Calcium 8.5 (L) 8.9 - 10.3 mg/dL   Total Protein 5.0 (L) 6.5 - 8.1 g/dL   Albumin 2.6 (L) 3.5 - 5.0 g/dL   AST 100 (H) 15 - 41 U/L    ALT 97 (H) 17 - 63 U/L   Alkaline Phosphatase 49 38 - 126 U/L   Total Bilirubin 0.5 0.3 - 1.2 mg/dL   GFR calc non Af Amer >60 >60 mL/min   GFR calc Af Amer >60 >60 mL/min    Comment: (NOTE) The eGFR has been calculated using the CKD EPI equation. This calculation has not been validated in all clinical situations. eGFR's persistently <60 mL/min signify possible Chronic Kidney Disease.    Anion gap 7 5 - 15  CK     Status: Abnormal   Collection Time: 06/10/16  2:17 AM  Result Value Ref Range   Total CK 1,736 (H) 49 - 397 U/L  Comprehensive metabolic panel     Status: Abnormal   Collection Time: 06/10/16  2:17 AM  Result Value Ref Range   Sodium 130 (L) 135 - 145 mmol/L   Potassium 4.0 3.5 - 5.1 mmol/L   Chloride 95 (L) 101 - 111 mmol/L   CO2 27 22 - 32 mmol/L   Glucose, Bld 103 (H) 65 - 99 mg/dL   BUN 15 6 - 20 mg/dL   Creatinine, Ser 0.66 0.61 - 1.24 mg/dL   Calcium 8.3 (L) 8.9 - 10.3 mg/dL   Total Protein 4.8 (L) 6.5 - 8.1 g/dL   Albumin 2.5 (L) 3.5 - 5.0 g/dL   AST 88 (H) 15 - 41  U/L   ALT 91 (H) 17 - 63 U/L   Alkaline Phosphatase 45 38 - 126 U/L   Total Bilirubin 0.4 0.3 - 1.2 mg/dL   GFR calc non Af Amer >60 >60 mL/min   GFR calc Af Amer >60 >60 mL/min    Comment: (NOTE) The eGFR has been calculated using the CKD EPI equation. This calculation has not been validated in all clinical situations. eGFR's persistently <60 mL/min signify possible Chronic Kidney Disease.    Anion gap 8 5 - 15   No results found.     Medical Problem List and Plan: 1.  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 Plan muscle biopsy once CKs stabilized. Continue Solu-Medrol as directed 2.  DVT Prophylaxis/Anticoagulation: Subcutaneous Lovenox. Monitor platelet counts and any signs of bleeding. Patient is ambulatory 3. Pain Management: Oxycodone as needed 4. Mood: Does reveal 50 mg bedtime as needed 5. Neuropsych: This patient is  capable of making decisions on his own behalf. 6. Skin/Wound Care: Routine skin checks 7. Fluids/Electrolytes/Nutrition: Routine I&O with follow-up chemistries 8. 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 Monitor for any signs of fluid overload. Lasix 80 mg twice a day Follow-up cardiology services needed. 9. CAD. Significant coronary calcification noted on CT imaging. Plan right and left heart catheterization once fluid status stabilized and optimized. Follow-up per cardiology services 10. Hypothyroidism. Synthroid 175 mcg daily 11. Hyponatremia. Follow-up chemistries. 12. Constipation with Hemorrhoids. Sitz baths and proctoscopy foam cream. Adjust bowel program  Post Admission Physician Evaluation: 1. Preadmission assessment reviewed and changes made below. 2. 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 3. Patient is admitted to receive collaborative, interdisciplinary care between the physiatrist, rehab nursing staff, and therapy team. 4. Patient's level of medical complexity and substantial therapy needs in context of that medical necessity cannot be provided at a lesser intensity of care such as a SNF. 5. Patient has experienced substantial functional loss from his/her baseline which was documented above under the "Functional History" and "Functional Status" headings.  Judging by the patient's diagnosis, physical exam, and functional history, the patient has potential for functional progress which will result in measurable gains while on inpatient rehab.  These gains will be of substantial and practical use upon discharge  in facilitating mobility and self-care at the household level. 6. Physiatrist will provide 24 hour management of medical needs as well as oversight of the therapy plan/treatment and provide guidance as appropriate regarding the interaction of the two. 7. The Preadmission Screening has been reviewed and patient status is unchanged unless otherwise  stated above. 8. 24 hour rehab nursing will assist with safety, disease management and patient education  and help integrate therapy concepts, techniques,education, etc. 9. PT will assess and treat for/with: Lower extremity strength, range of motion, stamina, balance, functional mobility, safety, adaptive techniques and equipment, coping skills, pain control, education.   Goals are: Mod I. 10. OT will assess and treat for/with: ADL's, functional mobility, safety, upper extremity strength, adaptive techniques and equipment, ego support, and community reintegration.   Goals are: Mod I. Therapy may proceed with showering this patient. 11. Case Management and Social Worker will assess and treat for psychological issues and discharge planning. 12. Team conference will be held weekly to assess progress toward goals and to determine barriers to discharge. 13. Patient will receive at least 3 hours of therapy per day at least 5 days per week. 14. ELOS: 6-9 days.       15. Prognosis:  good  Delice Lesch, MD, Mellody Drown Cathlyn Parsons., PA-C 06/10/2016

## 2016-06-10 NOTE — PMR Pre-admission (Signed)
PMR Admission Coordinator Pre-Admission Assessment  Patient: Connor Vargas is an 56 y.o., male MRN: [id number] DOB: March 16, 1961 Height: 6\' 1"  (185.4 cm) Weight: 80.3 kg (177 lb)              Insurance Information HMO:     PPO: X     PCP:      IPA:      80/20:      OTHER:  PRIMARY: [employer] [place]       Policy#: [id number]      Subscriber: Self   CM Name: Authorization Team       Phone#: Authorization Team Line: [phone]     Fax#: [phone] Pre-Cert#: [id number]  For 7 days 06/10/16-06/16/16 with expected discharge on 3/14; if patient needs longer faxed clinical updates with barriers to discharge are to be faxed to the Authorization Team    Employer: [employer] [place]  Benefits:  Phone #: [phone]     Name: automated line  Eff. Date: 04/06/13     Deduct: $750      Out of Pocket Max: $3,000      Life Max: N/A CIR: 80%/20%      SNF: 80%/20% Outpatient: 80%     Co-Pay: 20% Home Health: 80%      Co-Pay: 20% DME: 80%     Co-Pay: 20% Providers: in network   Medicaid Application Date:       Case Manager:  Disability Application Date:       Case Worker:   Emergency Contact Information Contact Information    Name Relation Home Work Mobile   Doner,Pj Son [phone]       Current Medical History  Patient Admitting Diagnosis: Debilitation   History of Present Illness: Connor Vargas a 56 y.o.right handed malewith history of recent diagnosed hypothyroidism with TSH a few months ago reportedly greater than 90. History taken from chart review and patient. Placed on hormone supplement and titrated but continued to decline with generalized weakness. He was sent to [facility] about 2 weeks prior was later discharged to home. He was scheduled to meet with a rheumatologist March 6. Presented 05/31/2016 with progressive weakness lower extremities as well as shortness of breath. 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 He lives with his wifein a split-level home. Found to be hyponatremic 125, BNP 1328, TSH 15.602, CK 5482, sedimentation rate 15, troponin 0.59. CT of the chest without contrast showed moderately large bilateral pleural effusions.Underwent right effusion tap 06/04/2016 with 800 mL obtained and left effusion tap 06/05/2016 with a 500 mL yield Extensive coronary artery disease with heavy calcification left main and left coronary artery. Echocardiogram with ejection fraction of 30%. 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 Placed on intravenous Lasix. Cardiology services consulted and plan for right and left heart catheterization once fluid status improved and optimized.. Patient remains on Synthroid as advised. 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 Rheumatology Dr. Selena Batten at [facility] was contacted and advised intravenous Solu-Medrol monitoring of CPK slow prednisone taper. Hold on muscle biopsy at this time until CK level stable due to high risk of muscle necrosis. Latest CK levels improved 1736. Hyponatremia persist 129-130 likely from fluid overload and hypothyroidism. Placed on subcutaneous Lovenox for DVT prophylaxis. Tolerating a regular diet. Therapy evaluation completed 06/01/2016 with recommendations of physical medicine rehabilitation  consult. Patient was admitted for a comprehensive rehabilitation program 06/10/16.      Past Medical History  Past Medical History:  Diagnosis Date  . Bell's palsy    1980  . Thyroid disease     Family History  family history includes Coronary artery disease (age of onset: 4547) in his mother.  Prior Rehab/Hospitalizations:  Has the patient had major surgery during 100 days prior to admission? No  Current Medications   Current Facility-Administered Medications:  .  aspirin EC tablet 81 mg, 81 mg, Oral, Daily, Dorothea OgleIskra M Myers, MD, 81 mg at  06/10/16 1141 .  enoxaparin (LOVENOX) injection 40 mg, 40 mg, Subcutaneous, Q24H, Dorothea OgleIskra M Myers, MD, 40 mg at 06/09/16 2144 .  furosemide (LASIX) tablet 80 mg, 80 mg, Oral, BID, Jake BatheMark C Skains, MD .  hydrocortisone (ANUSOL-HC) suppository 25 mg, 25 mg, Rectal, BID, Dorothea OgleIskra M Myers, MD, 25 mg at 06/07/16 1029 .  hydrocortisone-pramoxine (PROCTOFOAM-HC) rectal foam 1 applicator, 1 applicator, Rectal, Once, Jacalyn LefevreJulie Haviland, MD .  levothyroxine (SYNTHROID, LEVOTHROID) tablet 175 mcg, 175 mcg, Oral, QAC breakfast, Dorothea OgleIskra M Myers, MD, 175 mcg at 06/10/16 [phone] .  lidocaine (XYLOCAINE) 5 % ointment, , Topical, TID PRN, Abigail Miyamotoouglas Blackman, MD .  magnesium hydroxide (MILK OF MAGNESIA) suspension 15 mL, 15 mL, Oral, Daily PRN, Dorothea OgleIskra M Myers, MD .  magnesium hydroxide (MILK OF MAGNESIA) suspension 30 mL, 30 mL, Oral, BID, Dorothea OgleIskra M Myers, MD, 30 mL at 06/10/16 1141 .  ondansetron (ZOFRAN) tablet 4 mg, 4 mg, Oral, Q6H PRN **OR** ondansetron (ZOFRAN) injection 4 mg, 4 mg, Intravenous, Q6H PRN, Dorothea OgleIskra M Myers, MD, 4 mg at 06/01/16 1520 .  oxyCODONE (Oxy IR/ROXICODONE) immediate release tablet 5 mg, 5 mg, Oral, Q4H PRN, Dorothea OgleIskra M Myers, MD, 5 mg at 06/10/16 0514 .  pantoprazole (PROTONIX) EC tablet 40 mg, 40 mg, Oral, Q1200, Maretta BeesShanker M Ghimire, MD .  predniSONE (DELTASONE) tablet 60 mg, 60 mg, Oral, Q breakfast, Shanker Levora DredgeM Ghimire, MD .  sodium chloride flush (NS) 0.9 % injection 3 mL, 3 mL, Intravenous, Q12H, Dorothea OgleIskra M Myers, MD, 3 mL at 06/10/16 1142 .  traZODone (DESYREL) tablet 50 mg, 50 mg, Oral, QHS PRN, Dorothea OgleIskra M Myers, MD .  witch hazel-glycerin (TUCKS) pad, , Topical, TID, Edson SnowballBrooke A Miller, PA-C  Patients Current Diet: Diet regular Room service appropriate? Yes; Fluid consistency: Thin; Fluid restriction: 1500 mL Fluid Diet - low sodium heart healthy  Precautions / Restrictions Precautions Precautions: Fall Restrictions Weight Bearing Restrictions: No   Has the patient had 2 or more falls or a fall with injury in  the past year?Yes, 1 fall with hospital admission on January 7th, 2018 reportedly due to dehydration   Prior Activity Level Community (5-7x/wk): Prior to admission patient was active and fully independent.  He worked full time and drove daily.  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     Home Assistive Devices / Equipment Home Assistive Devices/Equipment: None Home Equipment: None  Prior Device Use: Indicate devices/aids used by the patient prior to current illness, exacerbation or injury? None of the above  Prior Functional Level Prior Function Level of Independence: Independent Comments: Works full time  Self Care: Did the patient need help bathing, dressing, using the toilet or eating? Independent  Indoor Mobility: Did the patient need assistance with walking from room to room (with or without device)? Independent  Stairs: Did the patient need assistance with internal or external stairs (with or without device)? Independent  Functional Cognition: Did the patient need help planning regular tasks such as shopping or remembering to take medications? Independent  Current Functional Level Cognition  Overall Cognitive Status: Within Functional Limits for tasks assessed Orientation Level: Oriented X4 General Comments: pt emotional about medical condition since he was working 6 days a week before and now can hardly walk    Extremity Assessment (includes Sensation/Coordination)  Upper Extremity Assessment: Defer to OT evaluation  Lower Extremity Assessment: Generalized weakness (noted edema, impaired coordination)    ADLs  Overall ADL's : Needs assistance/impaired Eating/Feeding: Set up, Supervision/ safety, Sitting Eating/Feeding Details (indicate cue type and reason): increased time Grooming: Standing, Min guard, Wash/dry hands, Designer, jewellery Details (indicate cue type and reason): increased time Upper Body Bathing: Set up, Sitting Upper Body Bathing  Details (indicate cue type and reason): increased time Lower Body Bathing: Sit to/from stand, Minimal assistance (simulated) Lower Body Bathing Details (indicate cue type and reason): min A sit<>stand; increased time Upper Body Dressing : Set up, Supervision/safety, Sitting Upper Body Dressing Details (indicate cue type and reason): increased time Lower Body Dressing: Moderate assistance, Sit to/from stand Lower Body Dressing Details (indicate cue type and reason): simulated Toilet Transfer: Minimal assistance, Moderate assistance, Grab bars, Ambulation, Comfort height toilet Toilet Transfer Details (indicate cue type and reason): min A for stand - sit, mod A for sit - stand. Cues for correct hand placement Toileting- Clothing Manipulation and Hygiene: Minimal assistance, Sit to/from stand Toileting - Clothing Manipulation Details (indicate cue type and reason): min A sit<>stand Tub/ Shower Transfer: Minimal assistance, Rolling walker, Ambulation, Grab bars, 3 in 1 Functional mobility during ADLs: Minimal assistance, Rolling walker, Moderate assistance General ADL Comments: Proximal weakness makes LB ADL difficult. Pt also has weakness  in B shoulder girdles which makes UB dressing difficult. At times, pt demonstrates difficultywith  transitions anteriorly in sitting due to weak core muscles    Mobility  Overal bed mobility: Needs Assistance Bed Mobility: Supine to Sit Rolling: Min assist Sidelying to sit: Min assist Supine to sit: Min assist Sit to supine: Mod assist General bed mobility comments: min A to elevate trunk, pt used rails    Transfers  Overall transfer level: Needs assistance Equipment used: Rolling walker (2 wheeled) Transfers: Sit to/from Stand Sit to Stand: Min assist General transfer comment: cues for correct hand placement    Ambulation / Gait / Stairs / Wheelchair Mobility  Ambulation/Gait Ambulation/Gait assistance: Hydrographic surveyor (Feet): 150  Feet Assistive device: Rolling walker (2 wheeled) Gait Pattern/deviations: Step-through pattern, Decreased stride length General Gait Details: pt requiring >10 standing rest breaks due to SOB, onset of fatigue however pt determined to "make the loop" Gait velocity: decreased Gait velocity interpretation: <1.8 ft/sec, indicative of risk for recurrent falls    Posture / Balance Dynamic Sitting Balance Sitting balance - Comments: able to urinate in urinal at EOB without difficulty Balance Overall balance assessment: Needs assistance Sitting-balance support: Feet supported, No upper extremity supported Sitting balance-Leahy Scale: Good Sitting balance - Comments: able to urinate in urinal at EOB without difficulty Standing balance support: Bilateral upper extremity supported, Single extremity supported, During functional activity Standing balance-Leahy Scale: Poor Standing balance comment: requires RW for stability    Special needs/care consideration BiPAP/CPAP: No CPM: No Continuous Drip IV: No Dialysis: No         Life Vest: No Oxygen: No Special Bed: No Trach Size: No Wound Vac (area): No       Skin: WDL, Dry  Bowel mgmt: 06/07/16  Bladder mgmt: Continent  Diabetic mgmt: No     Previous Home Environment Living Arrangements: Spouse/significant other, Children Available Help at Discharge: Family, Available PRN/intermittently Type of Home: House Home Layout: Multi-level Alternate Level Stairs-Rails: Right Alternate Level Stairs-Number of Steps: 6-8 Home Access: Level entry Home Care Services: No  Discharge Living Setting Plans for Discharge Living Setting: Patient's home, House, Lives with (comment) (spouse and son) Type of Home at Discharge: House Discharge Home Layout: Multi-level, 1/2 bath on main level Alternate Level Stairs-Rails: Left Alternate Level Stairs-Number of Steps:  (7, then landing, then 7 more ) Discharge Home Access: Level  entry Discharge Bathroom Shower/Tub: Tub/shower unit, Walk-in shower (has two bathrooms upstairs ) Discharge Bathroom Toilet: Standard Discharge Bathroom Accessibility: Yes How Accessible: Accessible via walker (reports one bathroom upstairs is accessible via walker ) Does the patient have any problems obtaining your medications?: No  Social/Family/Support Systems Patient Roles: Spouse, Parent, Other (Comment) (brother ) Contact Information: SonJAQWON MANFRED [phone] Anticipated Caregiver: Patient with Mod I goals  Anticipated Caregiver's Contact Information: Please go through son whose English is better than spouse  Ability/Limitations of Caregiver: Wife, Sister, and Son all work full-time and are available intermittently  Caregiver Availability: Intermittent Discharge Plan Discussed with Primary Caregiver: Yes Is Caregiver In Agreement with Plan?: Yes Does Caregiver/Family have Issues with Lodging/Transportation while Pt is in Rehab?: No  Goals/Additional Needs Patient/Family Goal for Rehab: PT/OT Mod I  Expected length of stay: 7 days  Cultural Considerations: Patient is [place] but understands [place] well Dietary Needs: Regular and thin  Equipment Needs: TBD Special Service Needs: None Additional Information: N/A Pt/Family Agrees to Admission and willing to participate: Yes Program Orientation Provided & Reviewed with Pt/Caregiver Including Roles  & Responsibilities: Yes Additional Information Needs: Patient needs to stay on steroids until seen by Rheumatologist as an outpatient  Information Needs to be Provided By: Medical Team    Decrease burden of Care through IP rehab admission: No  Possible need for SNF placement upon discharge: No  Patient Condition: This patient's medical and functional status has changed since the consult dated: 06/02/16 in which the Rehabilitation Physician determined and documented that the patient's condition is appropriate for intensive  rehabilitative care in an inpatient rehabilitation facility. See "History of Present Illness" (above) for medical update. Functional changes are: Min assist transfers and gait. Patient's medical and functional status update has been discussed with the Rehabilitation physician and patient remains appropriate for inpatient rehabilitation. Will admit to inpatient rehab today.  Preadmission Screen Completed By:  Fae Pippin, 06/10/2016 4:33 PM ______________________________________________________________________   Discussed status with Dr. Allena Katz on 06/10/16 at 1630 and received telephone approval for admission today.  Admission Coordinator:  Fae Pippin, time 1630/Date 06/10/16

## 2016-06-10 NOTE — Progress Notes (Addendum)
PROGRESS NOTE        PATIENT DETAILS Name: Connor Vargas Age: 56 y.o. Sex: male Date of Birth: Dec 10, 1960 Admit Date: 05/31/2016 Admitting Physician Dorothea OgleIskra M Myers, MD ZOX:[id number]:ELKINS,WILSON Joelene MillinLIVER, MD  Brief Narrative: Patient is a 56 y.o. 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 Patient was placed on Synthroid and subsequently discharged home. 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 See below for further details  Subjective: Feels much better-still having some amount of difficulty standing up and raising her arms above shoulder level-but markedly improved than before.  Assessment/Plan: Proximal muscle weakness: Suspicion for polymyositis, although could still have a myopathy related to hypothyroidism. 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 He still has some evidence of proximal weakness on exam, still requiring some assistance to stand from a sitting position. Will need a muscle biopsy at some point, Dr. Izola PriceMyers had spoken to patient's rheumatologist at [facility],recommended to hold off on muscle biopsy. Patient lost IV access today and does not want IV to be put back, even if clinical improvement, we will transition to prednisone. Discussed with patient in great detail, that he will require age-appropriate cancer screening-if this turns out to be polymyositis. 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 Much more compensated with Lasix. Cardiology following. Patient not keen on pursuing a LHC at this time.  Bilateral pleural effusion:  Unable to calculate light's criteria-but suspect this is a transudate-likely related to CHF.  Hypothyroidism: Recently [facility] in early February for myxedema without coma, started on Synthroid-his TSH has now come down to 15. Not sure if proximal muscle weakness is related to hypothyroidism, or he has polymyositis. See above regarding muscle biopsy.  Hyponatremia: Probably secondary to hypervolemia and hypothyroidism. Improving with diuretic therapy.  Minimally elevated LFTs: Likely related to myopathy. Follow periodically.  Normocytic Anemia: Likely related to chronic disease. Follow.  Hemorrhoids: Continue supportive care with Anusol.  DVT Prophylaxis: Prophylactic Lovenox   Code Status:  Full code   Family Communication: None at bedside  Disposition Plan: Remain inpatient-?CIR when bed available  Antimicrobial agents: Anti-infectives    None      Procedures: Echo 2/26>> - Left ventricle: Septal apical inferior and distal anterior wall   hypokinesis The cavity size was severely dilated. Wall thickness   was normal. Systolic function was severely reduced. The estimated   ejection fraction was in the range of 25% to 30%. Left   ventricular diastolic function parameters were normal. - Aortic valve: There was mild regurgitation. - Mitral valve: There was mild regurgitation. - Left atrium: The appendage was moderately to severely dilated. - Atrial septum: No defect or patent foramen ovale was identified. - Pericardium, extracardiac: Small to moderate posterior lateral   pericardial effusion no tamponade   Moderate left pleural effusion.  CONSULTS:  cardiology and general surgery  Time spent: 25 minutes-Greater than 50% of this time was spent in counseling, explanation of diagnosis, planning of further management, and coordination of care.  MEDICATIONS: Scheduled Meds: . aspirin EC  81  mg Oral Daily  . enoxaparin (LOVENOX) injection   40 mg Subcutaneous Q24H  . furosemide  80 mg Oral BID  . hydrocortisone  25 mg Rectal BID  . hydrocortisone-pramoxine  1 applicator Rectal Once  . levothyroxine  175 mcg Oral QAC breakfast  . magnesium hydroxide  30 mL Oral BID  . predniSONE  60 mg Oral Q breakfast  . sodium chloride flush  3 mL Intravenous Q12H  . witch hazel-glycerin   Topical TID   Continuous Infusions: PRN Meds:.lidocaine, magnesium hydroxide, ondansetron **OR** ondansetron (ZOFRAN) IV, oxyCODONE, traZODone   PHYSICAL EXAM: Vital signs: Vitals:   06/09/16 0629 06/09/16 1700 06/09/16 2033 06/10/16 0515  BP: 99/60 (!) 101/59 (!) 97/54 (!) 99/59  Pulse: 71 70 68 67  Resp: 20  18 18   Temp: 98 F (36.7 C) 98 F (36.7 C) 97.8 F (36.6 C) 98 F (36.7 C)  TempSrc: Oral Oral Oral Oral  SpO2:  98% 97% 97%  Weight:    80.3 kg (177 lb)  Height:       Filed Weights   06/07/16 0553 06/09/16 0500 06/10/16 0515  Weight: 82.1 kg (181 lb) 80.4 kg (177 lb 4.8 oz) 80.3 kg (177 lb)   Body mass index is 23.35 kg/m.   General appearance :Awake, alert, not in any distress. Speech Clear. Eyes:, pupils equally reactive to light and accomodation,no scleral icterus HEENT: Atraumatic and Normocephalic Neck: supple, no JVD. No cervical lymphadenopathy.  Resp:Good air entry bilaterally, no added sounds  CVS: S1 S2 regular, no murmurs.  GI: Bowel sounds present, Non tender and not distended with no gaurding, rigidity or rebound. Extremities: B/L Lower Ext shows no edema, both legs are warm to touch Neurology:  speech clear,Non focal-However has some proximal hip girdle and shoulder girdle muscle weakness-although improved Psychiatric: Normal judgment and insight. Alert and oriented x 3.. Musculoskeletal:No digital cyanosis Skin: Coarse skin in his hands Wounds:N/A  I have personally reviewed following labs and imaging studies  LABORATORY DATA: CBC:  Recent Labs Lab 06/04/16 0343 06/05/16 0345 06/06/16 0350  WBC 8.6  7.2 7.1  HGB 10.7* 10.3* 10.4*  HCT 32.8* 31.7* 32.1*  MCV 87.7 88.1 87.9  PLT 281 261 241    Basic Metabolic Panel:  Recent Labs Lab 06/04/16 0343 06/05/16 0345 06/06/16 0350 06/07/16 0500 06/08/16 0200 06/09/16 0215 06/10/16 0217  NA 129* 128* 128* 129* 129* 131* 130*  K 4.3 4.4 4.3 4.9 4.7 4.2 4.0  CL 96* 96* 96* 98* 96* 97* 95*  CO2 24 24 25 24 25 27 27   GLUCOSE 83 76 89 110* 105* 102* 103*  BUN 16 19 19 19 18 17 15   CREATININE 0.78 0.91 0.85 0.65 0.59* 0.68 0.66  CALCIUM 8.4* 8.2* 8.1* 8.3* 8.4* 8.5* 8.3*  MG 1.9 2.0  --   --   --   --   --     GFR: Estimated Creatinine Clearance: 115.1 mL/min (by C-G formula based on SCr of 0.66 mg/dL).  Liver Function Tests:  Recent Labs Lab 06/06/16 0350 06/07/16 0500 06/08/16 0200 06/09/16 0215 06/10/16 0217  AST 151* 124* 129* 100* 88*  ALT 98* 95* 109* 97* 91*  ALKPHOS 45 48 50 49 45  BILITOT 0.5 0.4 0.4 0.5 0.4  PROT 4.5* 4.8* 5.2* 5.0* 4.8*  ALBUMIN 2.3* 2.4* 2.7* 2.6* 2.5*   No results for input(s): LIPASE, AMYLASE in the last 168 hours. No results for input(s): AMMONIA in the last 168 hours.  Coagulation Profile: No  results for input(s): INR, PROTIME in the last 168 hours.  Cardiac Enzymes:  Recent Labs Lab 06/06/16 0350 06/07/16 0500 06/08/16 0200 06/09/16 0215 06/10/16 0217  CKTOTAL 3,943* 2,994* 2,716* 2,005* 1,736*    BNP (last 3 results) No results for input(s): PROBNP in the last 8760 hours.  HbA1C: No results for input(s): HGBA1C in the last 72 hours.  CBG: No results for input(s): GLUCAP in the last 168 hours.  Lipid Profile: No results for input(s): CHOL, HDL, LDLCALC, TRIG, CHOLHDL, LDLDIRECT in the last 72 hours.  Thyroid Function Tests: No results for input(s): TSH, T4TOTAL, FREET4, T3FREE, THYROIDAB in the last 72 hours.  Anemia Panel: No results for input(s): VITAMINB12, FOLATE, FERRITIN, TIBC, IRON, RETICCTPCT in the last 72 hours.  Urine analysis:    Component Value  Date/Time   COLORURINE YELLOW 05/31/2016 1345   APPEARANCEUR CLEAR 05/31/2016 1345   LABSPEC 1.013 05/31/2016 1345   PHURINE 5.0 05/31/2016 1345   GLUCOSEU NEGATIVE 05/31/2016 1345   HGBUR MODERATE (A) 05/31/2016 1345   BILIRUBINUR NEGATIVE 05/31/2016 1345   KETONESUR NEGATIVE 05/31/2016 1345   PROTEINUR NEGATIVE 05/31/2016 1345   NITRITE NEGATIVE 05/31/2016 1345   LEUKOCYTESUR NEGATIVE 05/31/2016 1345    Sepsis Labs: Lactic Acid, Venous No results found for: LATICACIDVEN  MICROBIOLOGY: Recent Results (from the past 240 hour(s))  Culture, body fluid-bottle     Status: None   Collection Time: 06/04/16 11:47 AM  Result Value Ref Range Status   Specimen Description PLEURAL RIGHT  Final   Special Requests NONE  Final   Culture NO GROWTH 5 DAYS  Final   Report Status 06/09/2016 FINAL  Final  Gram stain     Status: None   Collection Time: 06/04/16 11:47 AM  Result Value Ref Range Status   Specimen Description PLEURAL RIGHT  Final   Special Requests NONE  Final   Gram Stain   Final    FEW WBC PRESENT, PREDOMINANTLY PMN NO ORGANISMS SEEN    Report Status 06/04/2016 FINAL  Final    RADIOLOGY STUDIES/RESULTS: Dg Chest 1 View  Result Date: 06/05/2016 CLINICAL DATA:  Followup left thoracentesis EXAM: CHEST 1 VIEW COMPARISON:  06/04/2016 FINDINGS: No pneumothorax. There is probably less pleural fluid on the left. Small effusions persist with basilar atelectasis. There is venous hypertension with interstitial edema. IMPRESSION: Less pleural fluid on the left. No pneumothorax following thoracentesis. Persistent basilar atelectasis and mild edema. Electronically Signed   By: Paulina Fusi M.D.   On: 06/05/2016 16:20   Dg Chest 1 View  Result Date: 06/04/2016 CLINICAL DATA:  Right-sided thoracentesis EXAM: CHEST 1 VIEW COMPARISON:  CT chest of 06/03/2016 FINDINGS: Much of the right pleural effusion has been evacuated by right thoracentesis. No pneumothorax is seen. A small left effusion is  present with bibasilar atelectasis left-greater-than-right. Cardiomegaly is stable. IMPRESSION: Much of the right pleural effusion has been evacuated by right thoracentesis. No pneumothorax is seen. Electronically Signed   By: Dwyane Dee M.D.   On: 06/04/2016 10:21   Dg Chest 2 View  Result Date: 05/31/2016 CLINICAL DATA:  Generalized weakness.  Shortness of breath. EXAM: CHEST  2 VIEW COMPARISON:  One-view chest x-ray 04/13/2015 FINDINGS: The heart is enlarged. Atherosclerotic calcifications are present at the aortic arch. Diffuse interstitial and airspace disease is present. Bilateral effusions are present, left greater than right. Left greater than right basilar airspace disease is present. IMPRESSION: 1. Cardiomegaly with interstitial and airspace disease likely reflecting edema and bilateral effusions suggesting congestive heart  failure. 2. Left greater than right pleural effusions and airspace disease. While this likely reflects atelectasis, infection is also considered. Electronically Signed   By: Marin Roberts M.D.   On: 05/31/2016 14:38   Ct Chest Wo Contrast  Result Date: 06/03/2016 CLINICAL DATA:  Pleural effusions.  Dyspnea. EXAM: CT CHEST WITHOUT CONTRAST TECHNIQUE: Multidetector CT imaging of the chest was performed following the standard protocol without IV contrast. COMPARISON:  CT chest without contrast 05/31/2016 FINDINGS: Cardiovascular: The heart size is normal. Dense coronary artery calcifications are present. The small pericardial effusion is stable. The aorta and pulmonary arteries are unremarkable. Mediastinum/Nodes: Subcentimeter mediastinal lymph nodes are stable. No significant axillary adenopathy is present. Lungs/Pleura: Moderate size pleural effusions are similar the prior study. Dependent airspace disease bilaterally is not significantly changed. There is no central obstructing mass. There is a ground-glass attenuation are present as well. High-density material is again  noted within the lower lobe bronchi bilaterally Upper Abdomen: Gallstones are present. No significant inflammatory changes are present to suggest cholecystitis. Limited imaging of the abdomen is otherwise unremarkable. Musculoskeletal: Vertebral body heights and alignment are maintained. No focal lytic or blastic lesions are present. IMPRESSION: 1. No significant interval change in moderate bilateral pleural effusions and bibasilar airspace disease. While this likely reflects atelectasis, infection is not excluded. 2. Stable small pericardial effusion. 3. Atherosclerotic disease including dense coronary artery disease. 4. High-density material within the lower lobe bronchi remains concerning for aspiration. Electronically Signed   By: Marin Roberts M.D.   On: 06/03/2016 14:24   Ct Chest Wo Contrast  Result Date: 05/31/2016 CLINICAL DATA:  Dyspnea EXAM: CT CHEST WITHOUT CONTRAST TECHNIQUE: Multidetector CT imaging of the chest was performed following the standard protocol without IV contrast. COMPARISON:  Chest x-ray 05/31/2016 FINDINGS: Cardiovascular: Limited vascular evaluation without intravenous contrast. Negative for aortic aneurysm. Mild atherosclerotic disease aortic arch. Extensive calcification in the left main and left anterior descending coronary artery and in the circumflex. Right coronary calcification. Heart size normal. Small pericardial effusion. Mediastinum/Nodes: No mass or adenopathy Lungs/Pleura: Moderate pleural effusion bilaterally. Bibasilar airspace disease may represent pneumonia or atelectasis. There is dense material in the lower lobe airways bilaterally which is likely aspirated material. No high-density material in the stomach. Upper lobes relatively clear. Upper Abdomen: Negative Musculoskeletal: Negative IMPRESSION: Moderately large bilateral pleural effusions. Small pericardial effusion Extensive coronary artery disease with heavy calcification left main and left coronary  artery. Right coronary artery also calcified Bibasilar airspace disease which may be atelectasis or infiltrate. There is high-density material in the lower lobe airway bilaterally which is likely aspirated material, possibly barium or other ingested material. No high-density material is seen in the stomach at this time. Electronically Signed   By: Marlan Palau M.D.   On: 05/31/2016 21:21   US Thoracentesis Asp Pleural Space W/img Guide  Result Date: 06/05/2016 INDICATION: Symptomatic left sided pleural effusion EXAM: US THORACENTESIS ASP PLEURAL SPACE W/IMG GUIDE COMPARISON:  None. MEDICATIONS: 10 cc 1% lidocaine COMPLICATIONS: None immediate. TECHNIQUE: Informed written consent was obtained from the patient after a discussion of the risks, benefits and alternatives to treatment. A timeout was performed prior to the initiation of the procedure. Initial ultrasound scanning demonstrates a left pleural effusion. The lower chest was prepped and draped in the usual sterile fashion. 1% lidocaine was used for local anesthesia. Under direct ultrasound guidance, a 19 gauge, 7-cm, Yueh catheter was introduced. An ultrasound image was saved for documentation purposes. The thoracentesis was performed. The catheter was  removed and a dressing was applied. The patient tolerated the procedure well without immediate post procedural complication. The patient was escorted to have an upright chest radiograph. FINDINGS: A total of approximately 500 cc of clear yellow fluid was removed. IMPRESSION: Successful ultrasound-guided left sided thoracentesis yielding 0.5 liters of pleural fluid. Read by Robet Leu [facility] Electronically Signed   By: Corlis Leak M.D.   On: 06/05/2016 15:48   US Thoracentesis Asp Pleural Space W/img Guide  Result Date: 06/04/2016 INDICATION: New onset decompensated systolic congestive heart with an ejection fraction of 25% and new bilateral pleural effusions. Request is made for diagnostic and therapeutic  thoracentesis. EXAM: ULTRASOUND GUIDED DIAGNOSTIC AND THERAPEUTIC THORACENTESIS MEDICATIONS: 1% lidocaine. COMPLICATIONS: None immediate. PROCEDURE: An ultrasound guided thoracentesis was thoroughly discussed with the patient and questions answered. The benefits, risks, alternatives and complications were also discussed. The patient understands and wishes to proceed with the procedure. Written consent was obtained. Ultrasound was performed to localize and mark an adequate pocket of fluid in the right chest. The area was then prepped and draped in the normal sterile fashion. 1% Lidocaine was used for local anesthesia. Under ultrasound guidance a Safe-T-Centesis catheter was introduced. Thoracentesis was performed. The catheter was removed and a dressing applied. FINDINGS: A total of approximately 0.8 L of yellow fluid was removed. Samples were sent to the laboratory as requested by the clinical team. IMPRESSION: Successful ultrasound guided right thoracentesis yielding 0.8 L of pleural fluid. Read by: Barnetta Chapel, PA-C Electronically Signed   By: Simonne Come M.D.   On: 06/04/2016 14:12     LOS: 10 days   Jeoffrey Massed, MD  [facility] Pager:[phone]  If 7PM-7AM, please contact night-coverage [email or web address] Password [email or web address] 06/10/2016, 3:06 PM

## 2016-06-11 ENCOUNTER — Inpatient Hospital Stay (HOSPITAL_COMMUNITY): Payer: BLUE CROSS/BLUE SHIELD | Admitting: Physical Therapy

## 2016-06-11 ENCOUNTER — Inpatient Hospital Stay (HOSPITAL_COMMUNITY): Payer: BLUE CROSS/BLUE SHIELD | Admitting: Occupational Therapy

## 2016-06-11 DIAGNOSIS — D72828 Other elevated white blood cell count: Secondary | ICD-10-CM

## 2016-06-11 DIAGNOSIS — K5901 Slow transit constipation: Secondary | ICD-10-CM

## 2016-06-11 DIAGNOSIS — E46 Unspecified protein-calorie malnutrition: Secondary | ICD-10-CM

## 2016-06-11 DIAGNOSIS — D62 Acute posthemorrhagic anemia: Secondary | ICD-10-CM | POA: Insufficient documentation

## 2016-06-11 DIAGNOSIS — E8809 Other disorders of plasma-protein metabolism, not elsewhere classified: Secondary | ICD-10-CM | POA: Diagnosis present

## 2016-06-11 DIAGNOSIS — K649 Unspecified hemorrhoids: Secondary | ICD-10-CM

## 2016-06-11 DIAGNOSIS — E871 Hypo-osmolality and hyponatremia: Secondary | ICD-10-CM

## 2016-06-11 DIAGNOSIS — M3320 Polymyositis, organ involvement unspecified: Secondary | ICD-10-CM

## 2016-06-11 DIAGNOSIS — I5041 Acute combined systolic (congestive) and diastolic (congestive) heart failure: Secondary | ICD-10-CM

## 2016-06-11 DIAGNOSIS — I2510 Atherosclerotic heart disease of native coronary artery without angina pectoris: Secondary | ICD-10-CM

## 2016-06-11 DIAGNOSIS — D72829 Elevated white blood cell count, unspecified: Secondary | ICD-10-CM | POA: Insufficient documentation

## 2016-06-11 LAB — CBC WITH DIFFERENTIAL/PLATELET
BASOS PCT: 0 %
Basophils Absolute: 0 10*3/uL (ref 0.0–0.1)
EOS PCT: 0 %
Eosinophils Absolute: 0 10*3/uL (ref 0.0–0.7)
HEMATOCRIT: 36.9 % — AB (ref 39.0–52.0)
HEMOGLOBIN: 11.9 g/dL — AB (ref 13.0–17.0)
LYMPHS ABS: 2.1 10*3/uL (ref 0.7–4.0)
Lymphocytes Relative: 15 %
MCH: 28.9 pg (ref 26.0–34.0)
MCHC: 32.2 g/dL (ref 30.0–36.0)
MCV: 89.6 fL (ref 78.0–100.0)
MONOS PCT: 9 %
Monocytes Absolute: 1.3 10*3/uL — ABNORMAL HIGH (ref 0.1–1.0)
Neutro Abs: 10.8 10*3/uL — ABNORMAL HIGH (ref 1.7–7.7)
Neutrophils Relative %: 76 %
Platelets: 248 10*3/uL (ref 150–400)
RBC: 4.12 MIL/uL — AB (ref 4.22–5.81)
RDW: 14.9 % (ref 11.5–15.5)
WBC: 14.2 10*3/uL — AB (ref 4.0–10.5)

## 2016-06-11 LAB — COMPREHENSIVE METABOLIC PANEL
ALK PHOS: 46 U/L (ref 38–126)
ALT: 93 U/L — AB (ref 17–63)
AST: 86 U/L — AB (ref 15–41)
Albumin: 2.7 g/dL — ABNORMAL LOW (ref 3.5–5.0)
Anion gap: 8 (ref 5–15)
BUN: 14 mg/dL (ref 6–20)
CALCIUM: 8.4 mg/dL — AB (ref 8.9–10.3)
CO2: 30 mmol/L (ref 22–32)
CREATININE: 0.68 mg/dL (ref 0.61–1.24)
Chloride: 95 mmol/L — ABNORMAL LOW (ref 101–111)
GFR calc Af Amer: >60 mL/min (ref >60)
GLUCOSE: 85 mg/dL (ref 65–99)
Potassium: 3.8 mmol/L (ref 3.5–5.1)
Sodium: 133 mmol/L — ABNORMAL LOW (ref 135–145)
TOTAL PROTEIN: 5 g/dL — AB (ref 6.5–8.1)
Total Bilirubin: 0.4 mg/dL (ref 0.3–1.2)

## 2016-06-11 LAB — CK: CK TOTAL: 1570 U/L — AB (ref 49–397)

## 2016-06-11 MED ORDER — DOCUSATE SODIUM 100 MG PO CAPS
200.0000 mg | ORAL_CAPSULE | Freq: Two times a day (BID) | ORAL | Status: DC
  Administered 2016-06-11 – 2016-06-20 (×19): 200 mg via ORAL
  Filled 2016-06-11 (×19): qty 2

## 2016-06-11 MED ORDER — PRO-STAT SUGAR FREE PO LIQD
30.0000 mL | Freq: Two times a day (BID) | ORAL | Status: DC
  Administered 2016-06-11 – 2016-06-20 (×19): 30 mL via ORAL
  Filled 2016-06-11 (×19): qty 30

## 2016-06-11 MED ORDER — PREDNISONE 20 MG PO TABS
60.0000 mg | ORAL_TABLET | Freq: Every day | ORAL | Status: DC
  Administered 2016-06-11 – 2016-06-16 (×6): 60 mg via ORAL
  Filled 2016-06-11 (×6): qty 3

## 2016-06-11 NOTE — Progress Notes (Signed)
[place] PHYSICAL MEDICINE & REHABILITATION     PROGRESS NOTE  Subjective/Complaints:  Pt laying in bed this AM.  He slept well.  Family at bedside.  He is ready to begin therapies.   ROS: Denies CP, SOB, N/V/D.  Objective: Vital Signs: Blood pressure (!) 108/58, pulse 83, temperature 98.2 F (36.8 C), temperature source Oral, resp. rate 16, height 6\' 1"  (1.854 m), weight 79.4 kg (175 lb 0.7 oz), SpO2 99 %. No results found.  Recent Labs  06/10/16 2203 06/11/16 0543  WBC 12.2* 14.2*  HGB 12.0* 11.9*  HCT 37.5* 36.9*  PLT 282 248    Recent Labs  06/10/16 0217 06/10/16 2203 06/11/16 0543  NA 130*  --  133*  K 4.0  --  3.8  CL 95*  --  95*  GLUCOSE 103*  --  85  BUN 15  --  14  CREATININE 0.66 0.70 0.68  CALCIUM 8.3*  --  8.4*   CBG (last 3)  No results for input(s): GLUCAP in the last 72 hours.  Wt Readings from Last 3 Encounters:  06/11/16 79.4 kg (175 lb 0.7 oz)  06/10/16 80.3 kg (177 lb)  04/13/15 79.2 kg (174 lb 9.7 oz)    Physical Exam:  BP (!) 108/58 (BP Location: Left Arm)   Pulse 83   Temp 98.2 F (36.8 C) (Oral)   Resp 16   Ht 6\' 1"  (1.854 m)   Wt 79.4 kg (175 lb 0.7 oz)   SpO2 99%   BMI 23.09 kg/m  Constitutional: He appears well-developed and well-nourished.  HENT: Normocephalic and atraumatic.  Eyes: EOMI. No discharge.  Cardiovascular: Normal rate, regular rhythm. No JVD.   Respiratory: Effort normal. Clear GI: Soft. Bowel sounds are normal. He exhibits no distension. There is no tenderness.  Musculoskeletal: He exhibits edema. He exhibits no tenderness.  Neurological: He is alert and oriented.  Motor: B/l UE shoulder abduction 4-/5, elbow flex/ext 4/5, wrist/hand 4+/5  B/l LE: 4+/5 (right slightly stronger than left) Sensation intact to light touch   Skin: Skin is warm and dry.  Psychiatric: He has a normal mood and affect. His behavior is normal.    Assessment/Plan: 1. 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 Physiatrist is providing close team supervision and 24 hour management of active medical problems listed below. Physiatrist and rehab team continue to assess barriers to discharge/monitor patient progress toward functional and medical goals.  Function:  Bathing Bathing position      Bathing parts      Bathing assist        Upper Body Dressing/Undressing Upper body dressing                    Upper body assist        Lower Body Dressing/Undressing Lower body dressing                                  Lower body assist        Toileting Toileting          Toileting assist     Transfers Chair/bed transfer Chair/bed transfer activity did not occur: Safety/medical concerns           LobbyistLocomotion Ambulation           Wheelchair  Cognition Comprehension Comprehension assist level: Understands basic 75 - 89% of the time/ requires cueing 10 - 24% of the time  Expression Expression assist level: Expresses basic 75 - 89% of the time/requires cueing 10 - 24% of the time. Needs helper to occlude trach/needs to repeat words.  Social Interaction Social Interaction assist level: Interacts appropriately with others - No medications needed.  Problem Solving Problem solving assist level: Solves complex problems: With extra time  Memory Memory assist level: Complete Independence: No helper    Medical Problem List and Plan: 1.  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 Plan muscle biopsy once CKs stabilized. Continue Solu-Medrol as directed  Begin CIR 2.  DVT Prophylaxis/Anticoagulation: Subcutaneous Lovenox. Monitor platelet counts and any signs of bleeding. Patient is ambulatory 3. Pain Management: Oxycodone as needed 4. Mood: Support 5. Neuropsych: This patient is capable of making decisions on  his own behalf. 6. Skin/Wound Care: Routine skin checks 7. Fluids/Electrolytes/Nutrition: Routine I&Os 8. 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 Monitor for any signs of fluid overload. Lasix 80 mg twice a day Follow-up cardiology services needed. Filed Weights   06/10/16 2115 06/11/16 0544  Weight: 79.4 kg (175 lb) 79.4 kg (175 lb 0.7 oz)   9. CAD. Significant coronary calcification noted on CT imaging. Plan right and left heart catheterization once fluid status stabilized and optimized. Follow-up per cardiology services 10. Hypothyroidism. Synthroid 175 mcg daily 11. Hyponatremia.   Na+ 133 on 3/8  Cont to monitor 12. Constipation with Hemorrhoids. Sitz baths and proctoscopy foam cream.   Reg increased 3/8 13. Leukocytosis  Likely Steroid induced  14.2 on 3/8  Cont to monitor 14. Hypoalbuminemia  Supplement initiated 3/8 15. ABLA  Hb 11.9 on 3/8  LOS (Days) 1 A FACE TO FACE EVALUATION WAS PERFORMED  Simora Dingee Karis Juba 06/11/2016 8:41 AM

## 2016-06-11 NOTE — IPOC Note (Signed)
Overall Plan of Care [facility](IPOC) Patient Details Name: Renae FickleRadovan Reiley MRN: [id number] DOB: [birth year]  Admitting Diagnosis: Debility  Hospital Problems: Active Problems:   Polymyositis (HCC)   Acute blood loss anemia   Leucocytosis   Hypoalbuminemia due to protein-calorie malnutrition [facility](HCC)     Functional Problem List: Nursing Bowel, Medication Management, Motor, Edema, Safety, Skin Integrity  PT Balance, Endurance, Motor, Safety  OT Balance, Endurance, Safety  SLP    TR         Basic ADL's: OT Grooming, Bathing, Dressing, Toileting     Advanced  ADL's: OT       Transfers: PT Bed Mobility, Bed to Chair, Car, Occupational psychologisturniture  OT Toilet, Research scientist (life sciences)Tub/Shower     Locomotion: PT Ambulation, Stairs     Additional Impairments: OT None  SLP        TR      Anticipated Outcomes Item Anticipated Outcome  Self Feeding n/a  Swallowing      Basic self-care  mod I   Toileting  mod I    Bathroom Transfers mod I - toileting, supervision - shower  Bowel/Bladder  manage with supervision  Transfers  mod I  Locomotion  mod I  Communication     Cognition     Pain  no c/o pain  Safety/Judgment  remain safe while on Rehab unit with supervision   Therapy Plan: PT Intensity: Minimum of 1-2 x/day ,45 to 90 minutes PT Frequency: 5 out of 7 days PT Duration Estimated Length of Stay: 7 days OT Intensity: Minimum of 1-2 x/day, 45 to 90 minutes OT Frequency: 5 out of 7 days OT Duration/Estimated Length of Stay: 7 days         Team Interventions: Nursing Interventions Patient/Family Education, Bowel Management, Disease Management/Prevention, Medication Management, Skin Care/Wound Management, Discharge Planning, Psychosocial Support  PT interventions Ambulation/gait training, Discharge planning, Functional mobility training, DME/adaptive equipment instruction, Pain management, Splinting/orthotics, Therapeutic Activities, UE/LE Strength taining/ROM, Wheelchair propulsion/positioning, UE/LE  Coordination activities, Therapeutic Exercise, Stair training, Patient/family education, Neuromuscular re-education, Functional electrical stimulation, Community reintegration, Warden/rangerBalance/vestibular training  OT Interventions Warden/rangerBalance/vestibular training, FirefighterCommunity reintegration, Discharge planning, Self Care/advanced ADL retraining, Therapeutic Exercise, UE/LE Strength taining/ROM, DME/adaptive equipment instruction, Patient/family education, UE/LE Coordination activities, Functional mobility training, Psychosocial support, Therapeutic Activities  SLP Interventions    TR Interventions    SW/CM Interventions Discharge Planning, Psychosocial Support, Patient/Family Education    Team Discharge Planning: Destination: PT-Home ,OT- Home , SLP-  Projected Follow-up: PT-Outpatient PT, OT-  None, SLP-  Projected Equipment Needs: PT-To be determined, OT- To be determined, SLP-  Equipment Details: PT- , OT-  Patient/family involved in discharge planning: PT- Patient,  OT-Patient, Family member/caregiver, SLP-   MD ELOS: 5-7 days. Medical Rehab Prognosis:  Excellent Assessment: 56 y.o.right handed malewith history of recent diagnosed hypothyroidism with TSH a few months ago reportedly greater than 90. Placed on hormone supplement and titrated but continued to decline with generalized weakness. [facility] was later discharged to home. He was scheduled to meet with a rheumatologist March 6. Presented 05/31/2016 with progressive weakness lower extremities as well as shortness of breath. 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 He lives with his wifein a split-level home. Found to be hyponatremic 125, BNP 1328, TSH 15.602, CK 5482, sedimentation rate 15, troponin 0.59. CT of the chest without contrast showed moderately large bilateral pleural effusions.Underwent right  effusion tap  06/04/2016 with 800 mL obtained and left effusion tap 06/05/2016 with a 500 mL yield Extensive coronary artery disease with heavy calcification left main and left coronary artery. CXR 3/2 reviewed, showing decreasing pleural effusion. Echocardiogram with ejection fraction of 30%. 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 Placed on intravenous Lasix. Cardiology services consulted and plan for right and left heart catheterization once fluid status improved and optimized. Patient remains on Synthroid as advised. 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 Rheumatology Dr. Selena Batten at [facility] was contacted and advised intravenous Solu-Medrol monitoring of CPK slow prednisone taper. Hold on muscle biopsy at this time until CK level stable due to high risk of muscle necrosis. Latest CK levels improved 1736. Hyponatremia persist likely from fluid overload and hypothyroidism. Pt with resulting functional deficits with gait, mobility, endurance.  Will set goals for Mod I with PT/OT.  See Team Conference Notes for weekly updates to the plan of care

## 2016-06-11 NOTE — Evaluation (Signed)
Occupational Therapy Assessment and Plan  Patient Details  Name: Connor Vargas MRN: [id number] Date of Birth: Mar 21, 1961  OT Diagnosis: abnormal posture and muscle weakness (generalized) Rehab Potential: Rehab Potential (ACUTE ONLY): Good ELOS: 7 days   Today's Date: 06/11/2016 OT Individual Time: 1300-1400 and 1445-1548 OT Individual Time Calculation (min):60 min and 63 min     Problem List:  Patient Active Problem List   Diagnosis Date Noted  . Acute blood loss anemia   . Leucocytosis   . Hypoalbuminemia due to protein-calorie malnutrition ([place])   . Polymyositis ([place])   . Acute pulmonary edema (HCC)   . Acute combined systolic and diastolic congestive heart failure ([place])   . Pain   . Slow transit constipation   . Hemorrhoids   . Pleural effusion   . S/P thoracentesis   . Hyponatremia   . Hypoalbuminemia   . Elevated troponin   . Myxedema   . Cardiomyopathy ([place])   . Coronary artery disease involving native coronary artery of native heart without angina pectoris   . Acute systolic congestive heart failure ([place])   . Pulmonary vascular congestion 05/31/2016  . Non-traumatic rhabdomyolysis 05/31/2016  . Transaminitis 05/31/2016  . Acquired hypothyroidism   . External hemorrhoids 04/14/2015  . Syncope 04/13/2015  . CAP (community acquired pneumonia) 04/13/2015  . Weakness 04/13/2015    Past Medical History:  Past Medical History:  Diagnosis Date  . Bell's palsy    1980  . Thyroid disease    Past Surgical History: History reviewed. No pertinent surgical history.  Assessment & Plan Clinical Impression: Patient is a 56 y.o. year old male with history of recent diagnosed hypothyroidism with TSH a few months ago reportedly greater than 90. History taken from chart review and patient. Placed on hormone supplement and titrated but continued to decline with generalized weakness. He was sent to [facility] about 2 weeks prior was later discharged to home. He  was scheduled to meet with a rheumatologist March 6. Presented 05/31/2016 with progressive weakness lower extremities as well as shortness of breath. 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 He lives with his wifein a split-level home. Found to be hyponatremic 125, BNP 1328, TSH 15.602, CK 5482, sedimentation rate 15, troponin 0.59. CT of the chest without contrast showed moderately large bilateral pleural effusions.Underwent right effusion tap 06/04/2016 with 800 mL obtained and left effusion tap 06/05/2016 with a 500 mL yield Extensive coronary artery disease with heavy calcification left main and left coronary artery. CXR 3/2 reviewed, showing decreasing pleural effusion. Echocardiogram with ejection fraction of 30%. 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 Placed on intravenous Lasix. Cardiology services consulted and plan for right and left heart catheterization once fluid status improved and optimized. Patient remains on Synthroid as advised. 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 Rheumatology Dr. Maudie Mercury at [facility] was contacted and advised intravenous Solu-Medrol monitoring of CPK slow prednisone taper. Hold on muscle biopsy at this time until CK level stable due to high risk of muscle necrosis. Latest CK levels improved 1736. Hyponatremia persist 129-130 likely from fluid overload and hypothyroidism. Placed on subcutaneous Lovenox for DVT prophylaxis. Tolerating a regular diet. Therapy evaluation completed 06/01/2016 with recommendations of physical medicine rehabilitation consult. Patient was admitted for a comprehensive rehabilitation program.  Patient transferred to CIR on 06/10/2016 .  Patient currently requires mod with basic self-care skills secondary to muscle weakness, decreased cardiorespiratoy endurance and  decreased sitting balance, decreased standing balance and decreased balance strategies.  Prior to hospitalization, patient could complete ADLs and IADLs with independent .  Patient will benefit from skilled intervention to increase independence with basic self-care skills prior to discharge home with care partner.  Anticipate patient will require mod I and no further OT follow recommended.  OT - End of Session Activity Tolerance: Decreased this session Endurance Deficit: Yes Endurance Deficit Description: multiple rest breaks secondary to fatigue OT Assessment Rehab Potential (ACUTE ONLY): Good OT Patient demonstrates impairments in the following area(s): Balance;Endurance;Safety OT Basic ADL's Functional Problem(s): Grooming;Bathing;Dressing;Toileting OT Transfers Functional Problem(s): Toilet;Tub/Shower OT Additional Impairment(s): None OT Plan OT Intensity: Minimum of 1-2 x/day, 45 to 90 minutes OT Frequency: 5 out of 7 days OT Duration/Estimated Length of Stay: 7 days OT Treatment/Interventions: Medical illustrator training;Community reintegration;Discharge planning;Self Care/advanced ADL retraining;Therapeutic Exercise;UE/LE Strength taining/ROM;DME/adaptive equipment instruction;Patient/family education;UE/LE Coordination activities;Functional mobility training;Psychosocial support;Therapeutic Activities OT Self Feeding Anticipated Outcome(s): n/a OT Basic Self-Care Anticipated Outcome(s): mod I  OT Toileting Anticipated Outcome(s): mod I  OT Bathroom Transfers Anticipated Outcome(s): mod I - toileting, supervision - shower OT Recommendation Patient destination: Home Follow Up Recommendations: None Equipment Recommended: To be determined   Skilled Therapeutic Intervention Session 1: Upon entering the room, pt supine in bed with two sisters present in the room. OT educated pt and caregivers on OT purpose, POC, and goals with them verbalizing understanding. Pt ambulated with RW and  min A to bathroom for bathing at shower level while seated on shower chair. Pt very modest and requesting therapist no intervene unless necessary. OT having to provide additional education to sisters regarding pt needing to perform tasks himself vs them helping. Pt returned to sitting on EOB to don clothing items with mod A for LB self care. TED donned with total A from therapist. Pt remained seated on EOB for meal. OT checked off sisters to assist pt to bathroom via RW.   Session 2: Pt supine in bed upon entering the room with sisters present this session. Pt with no c/o pain but reports extreme fatigue this session. Pt agreeable to sit on EOB with steady assistance for supine >sit. OT educated and demonstrated use of orange resistive theraband for B UE strengthening exercises. Pt performed 2 sets of 10 bicep curls, alternating punches, chest pulls, shoulder flexion, and shoulder diagonals. Pt needing rest break between each set and mod cues for pursed lip breathing. Pt returned to supine at end of session secondary to fatigue. Call bell and all needed items within reach upon exiting the room.   OT Evaluation Precautions/Restrictions  Precautions Precautions: Fall Restrictions Weight Bearing Restrictions: No Vital Signs Therapy Vitals Temp: 97.4 F (36.3 C) Temp Source: Oral Pulse Rate: 71 Resp: 18 BP: (!) 100/54 Patient Position (if appropriate): Lying Oxygen Therapy SpO2: 97 % O2 Device: Not Delivered Pain Pain Assessment Pain Assessment: No/denies pain Home Living/Prior Functioning Home Living Family/patient expects to be discharged to:: Private residence Living Arrangements: Spouse/significant other, Children Available Help at Discharge: Family, Available PRN/intermittently Type of Home: House Home Access: Level entry Home Layout: Multi-level Alternate Level Stairs-Number of Steps: 6-8 Alternate Level Stairs-Rails: Right Bathroom Shower/Tub: Tub/shower unit  Lives With:  Family Prior Function Level of Independence: Independent with basic ADLs, Independent with transfers, Independent with gait  Able to Take Stairs?: Yes Driving: Yes Vocation: Full time employment Comments: Works full time Vision/Perception  Vision- History Baseline Vision/History: No visual deficits Patient Visual Report: No change from baseline Vision- Assessment Vision Assessment?: No apparent visual deficits  Cognition Overall Cognitive Status: Within Functional Limits for tasks assessed Arousal/Alertness: Awake/alert Orientation Level: Person;Place;Situation Person: Oriented Place: Oriented Situation: Oriented Year: 2018 Month: March Day of Week: Correct Memory: Appears intact Immediate Memory Recall: Sock;Blue;Bed Memory Recall: Sock;Blue;Bed Memory Recall Sock: Without Cue Memory Recall Blue: Without Cue Memory Recall Bed: Without Cue Sensation Sensation Light Touch: Appears Intact Hot/Cold: Appears Intact Proprioception: Appears Intact Coordination Gross Motor Movements are Fluid and Coordinated: Yes Fine Motor Movements are Fluid and Coordinated: Yes Motor  Motor Motor - Skilled Clinical Observations: generalized weakness Mobility  Transfers Transfers: Sit to Stand;Stand to Sit Sit to Stand: 4: Min assist Stand to Sit: 4: Min assist  Trunk/Postural Assessment  Cervical Assessment Cervical Assessment: Exceptions to [facility] (forward head) Thoracic Assessment Thoracic Assessment: Exceptions to [facility] (kyphosis) Lumbar Assessment Lumbar Assessment: Exceptions to [facility] (posterior pelvic tilt) Postural Control Postural Control: Deficits on evaluation  Balance Dynamic Sitting Balance Sitting balance - Comments: min A Dynamic Standing Balance Dynamic Standing - Comments: min - mod A Extremity/Trunk Assessment RUE Assessment RUE Assessment: Exceptions to [facility] (3-/5 gross strength) LUE Assessment LUE Assessment: Exceptions to [facility] (3-/5 gross strength)   See  Function Navigator for Current Functional Status.   Refer to Care Plan for Long Term Goals  Recommendations for other services: None    Discharge Criteria: Patient will be discharged from OT if patient refuses treatment 3 consecutive times without medical reason, if treatment goals not met, if there is a change in medical status, if patient makes no progress towards goals or if patient is discharged from hospital.  The above assessment, treatment plan, treatment alternatives and goals were discussed and mutually agreed upon: by patient  Gypsy Decant 06/11/2016, 4:35 PM

## 2016-06-11 NOTE — Progress Notes (Signed)
Patient arrived on the unit at approximately 2115 via bed by staff. He is alert & responsive, verbal with family present. No c/o pain or discomfort. He has general muscle weakness with edema to BLE, ace wraps present. Patient speaks czechoslovakian, but also english. He is continent of bowel & bladder, on room air. No signs of respiratory distress noted. Vitals are stable. Will continue to monitor.

## 2016-06-11 NOTE — Evaluation (Signed)
Physical Therapy Assessment and Plan  Patient Details  Name: Connor Vargas MRN: [id number] Date of Birth: 02/13/1961  PT Diagnosis: Difficulty walking and Muscle weakness Rehab Potential: Good ELOS: 7 days   Today's Date: 06/11/2016 PT Individual Time: 1100-1200 PT Individual Time Calculation (min): 60 min    Problem List:  Patient Active Problem List   Diagnosis Date Noted  . Acute blood loss anemia   . Leucocytosis   . Hypoalbuminemia due to protein-calorie malnutrition ([place])   . Polymyositis ([place])   . Acute pulmonary edema (HCC)   . Acute combined systolic and diastolic congestive heart failure ([place])   . Pain   . Slow transit constipation   . Hemorrhoids   . Pleural effusion   . S/P thoracentesis   . Hyponatremia   . Hypoalbuminemia   . Elevated troponin   . Myxedema   . Cardiomyopathy ([place])   . Coronary artery disease involving native coronary artery of native heart without angina pectoris   . Acute systolic congestive heart failure ([place])   . Pulmonary vascular congestion 05/31/2016  . Non-traumatic rhabdomyolysis 05/31/2016  . Transaminitis 05/31/2016  . Acquired hypothyroidism   . External hemorrhoids 04/14/2015  . Syncope 04/13/2015  . CAP (community acquired pneumonia) 04/13/2015  . Weakness 04/13/2015    Past Medical History:  Past Medical History:  Diagnosis Date  . Bell's palsy    1980  . Thyroid disease    Past Surgical History: History reviewed. No pertinent surgical history.  Assessment & Plan Clinical Impression: Patient is a 56 y.o. year old male with recent admission to the hospital on 05/31/2016 with progressive weakness lower extremities as well as shortness of breath. 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 He lives with his wifein a split-level home. Found to be hyponatremic 125, BNP 1328, TSH 15.602, CK 5482, sedimentation rate 15, troponin 0.59. CT of the chest  without contrast showed moderately large bilateral pleural effusions.Underwent right effusion tap 06/04/2016 with 800 mL obtained and left effusion tap 06/05/2016 with a 500 mL yield Extensive coronary artery disease with heavy calcification left main and left coronary artery. CXR 3/2 reviewed, showing decreasing pleural effusion. Echocardiogram with ejection fraction of 30%. 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 Placed on intravenous Lasix. Cardiology services consulted and plan for right and left heart catheterization once fluid status improved and optimized. Patient remains on Synthroid as advised. 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  Patient transferred to CIR on 06/10/2016 .   Patient currently requires min with mobility secondary to muscle weakness and decreased standing balance, decreased postural control and decreased balance strategies.  Prior to hospitalization, patient was independent  with mobility and lived with Family in a House home.  Home access is  Level entry.  Patient will benefit from skilled PT intervention to maximize safe functional mobility, minimize fall risk and decrease caregiver burden for planned discharge home with intermittent assist.  Anticipate patient will benefit from follow up OP at discharge.  PT - End of Session Activity Tolerance: Tolerates 30+ min activity with multiple rests Endurance Deficit: Yes PT Assessment Rehab Potential (ACUTE/IP ONLY): Good Barriers to Discharge: Decreased caregiver support PT Patient demonstrates impairments in the following area(s): Balance;Endurance;Motor;Safety PT Transfers Functional Problem(s): Bed Mobility;Bed to Chair;Car;Furniture PT Locomotion Functional Problem(s): Ambulation;Stairs PT Plan PT Intensity: Minimum of 1-2 x/day ,  45 to 90 minutes PT Frequency: 5 out of 7 days PT Duration Estimated Length of Stay: 7 days PT  Treatment/Interventions: Ambulation/gait training;Discharge planning;Functional mobility training;DME/adaptive equipment instruction;Pain management;Splinting/orthotics;Therapeutic Activities;UE/LE Strength taining/ROM;Wheelchair propulsion/positioning;UE/LE Coordination activities;Therapeutic Exercise;Stair training;Patient/family education;Neuromuscular re-education;Functional electrical stimulation;Community reintegration;Balance/vestibular training PT Transfers Anticipated Outcome(s): mod I PT Locomotion Anticipated Outcome(s): mod I PT Recommendation Follow Up Recommendations: Outpatient PT Patient destination: Home Equipment Recommended: To be determined  Skilled Therapeutic Intervention Pt participated in skilled PT eval and was educated on PT POC and goals.  Pt performed gait without RW 20' x 2 with min A, wide BOS and pt fatigues quickly.  With RW pt able to gait with min guard 200', 150'.  Stair negotiation with min A with bilat handrails. Pt with heavy reliance on UEs during transfers and gait.  Sit to stand without UE support with min/mod A 2 x 5. Pt fatigues quickly but is motivated to improve.  PT Evaluation Precautions/Restrictions Precautions Precautions: Fall Restrictions Weight Bearing Restrictions: No Pain Pain Assessment Pain Assessment: No/denies pain Pain Score: 0-No pain Home Living/Prior Functioning Home Living Available Help at Discharge: Family;Available PRN/intermittently Type of Home: House Home Access: Level entry Home Layout: Multi-level Alternate Level Stairs-Number of Steps: 6-8 Alternate Level Stairs-Rails: Right  Lives With: Family Prior Function Level of Independence: Independent with basic ADLs;Independent with transfers;Independent with gait  Able to Take Stairs?: Yes Driving: Yes Vocation: Full time employment Comments: Works full time Tax adviser Overall Cognitive Status: Within Functional Limits for tasks  assessed Orientation Level: Oriented X4 Sensation Sensation Light Touch: Appears Intact Proprioception: Appears Intact Coordination Gross Motor Movements are Fluid and Coordinated: Yes Motor  Motor Motor - Skilled Clinical Observations: proximal mm weakness   Trunk/Postural Assessment  Cervical Assessment Cervical Assessment:  (forward head) Thoracic Assessment Thoracic Assessment:  (mild kyphosis) Lumbar Assessment Lumbar Assessment:  (posterior pelvic tilt) Postural Control Postural Control: Deficits on evaluation Righting Reactions: delayed  Balance Dynamic Standing Balance Dynamic Standing - Comments: mod A without AD, min A wiht RW Extremity Assessment      RLE Assessment RLE Assessment:  (hip flexors 2/5, knee and ankle 3/5) LLE Assessment LLE Assessment:  (hip flexor 2/5, knee and ankle 3/5)   See Function Navigator for Current Functional Status.   Refer to Care Plan for Long Term Goals  Recommendations for other services: None   Discharge Criteria: Patient will be discharged from PT if patient refuses treatment 3 consecutive times without medical reason, if treatment goals not met, if there is a change in medical status, if patient makes no progress towards goals or if patient is discharged from hospital.  The above assessment, treatment plan, treatment alternatives and goals were discussed and mutually agreed upon: by patient  Eulia Hatcher 06/11/2016, 12:00 PM

## 2016-06-11 NOTE — Progress Notes (Signed)
Ankit Karis Juba, MD Physician Signed Physical Medicine and Rehabilitation  Consult Note Date of Service: 06/01/2016 2:01 PM  Related encounter: ED to Hosp-Admission (Discharged) from 05/31/2016 in [facility] 2W CARDIAC UNIT     Expand All Collapse All   [] Hide copied text [] Hover for attribution information      Physical Medicine and Rehabilitation Consult Reason for Consult: Debilitation/rhabdomyolysis/multi-medical Referring Physician: [facility]   HPI: Connor Vargas is a 56 y.o. right handed male with history of recent diagnosed hypothyroidism with TSH a few months ago reportedly greater than 90. History taken from chart review and patient. Placed on hormone supplement and titrated but continued to decline with generalized weakness. He was sent to [facility] about 2 weeks prior was later discharged to home. He was scheduled to meet with a rheumatologist March 6. Presented 05/31/2016 with progressive weakness lower extremities as well as shortness of breath. 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 He lives with his wife in a split-level home. Found to be hyponatremic 125, BNP 1328, TSH 15.602, CK 5482, sedimentation rate 15, troponin 0.59. CT of the chest without contrast showed moderately large bilateral pleural effusions. Extensive coronary artery disease with heavy calcification left main and left coronary artery. Echocardiogram with ejection fraction of 30%. Placed on intravenous Lasix. Cardiology services consulted question plan for need cardiac catheterization. 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 Patient remains on Synthroid as advised. Placed on subcutaneous Lovenox for DVT prophylaxis. Tolerating a regular diet. Therapy evaluation completed 06/01/2016 with recommendations of physical medicine rehabilitation consult.   Review of Systems  Constitutional: Positive for  malaise/fatigue. Negative for chills and fever.  HENT: Negative for hearing loss and tinnitus.   Eyes: Negative for blurred vision and double vision.  Respiratory: Positive for shortness of breath. Negative for cough.   Cardiovascular: Positive for leg swelling. Negative for chest pain and palpitations.  Gastrointestinal: Positive for constipation. Negative for nausea and vomiting.  Genitourinary: Negative for dysuria, flank pain and hematuria.  Musculoskeletal: Positive for myalgias.  Skin: Negative for rash.  Neurological: Positive for weakness. Negative for seizures and loss of consciousness.  All other systems reviewed and are negative.      Past Medical History:  Diagnosis Date  . Bell's palsy    1980  . Thyroid disease    History reviewed. No pertinent surgical history.      Family History  Problem Relation Age of Onset  . Coronary artery disease Mother 90   Social History:  reports that he has never smoked. He has never used smokeless tobacco. He reports that he drinks alcohol. He reports that he does not use drugs. Allergies: No Known Allergies       Medications Prior to Admission  Medication Sig Dispense Refill  . aspirin EC 81 MG tablet Take 81 mg by mouth daily.    Marland Kitchen docusate sodium (COLACE) 100 MG capsule Take 1 capsule (100 mg total) by mouth 2 (two) times daily. 10 capsule 0  . levothyroxine (SYNTHROID, LEVOTHROID) 175 MCG tablet Take 175 mcg by mouth daily before breakfast.    . senna (SENOKOT) 8.6 MG tablet Take 1 tablet by mouth daily.    . traMADol (ULTRAM) 50 MG tablet Take 50 mg by mouth every 6 (six) hours as needed for moderate pain.    . hydrocortisone (ANUSOL-HC) 25 MG suppository Place 1 suppository (25 mg total) rectally 2 (  two) times daily. (Patient not taking: Reported on 05/31/2016) 12 suppository 0  . levofloxacin (LEVAQUIN) 750 MG tablet Take 1 tablet (750 mg total) by mouth daily. (Patient not taking: Reported on 05/31/2016) 4 tablet 0      Home: Home Living Family/patient expects to be discharged to:: Private residence Living Arrangements: Spouse/significant other, Children Available Help at Discharge: Family, Available PRN/intermittently Home Equipment: None  Functional History: Prior Function Level of Independence: Independent Comments: Works full time Functional Status:  Mobility: Bed Mobility Overal bed mobility: Needs Assistance Bed Mobility: Sit to Supine Sit to supine: Mod assist General bed mobility comments: A for legs only and pt did use rail Transfers Overall transfer level: Needs assistance Equipment used: Rolling walker (2 wheeled) Transfers: Sit to/from Stand Sit to Stand: Min assist General transfer comment: from recliner  ADL: ADL Overall ADL's : Needs assistance/impaired Eating/Feeding: Set up, Supervision/ safety, Sitting Eating/Feeding Details (indicate cue type and reason): increased time Grooming: Supervision/safety, Set up, Sitting Grooming Details (indicate cue type and reason): increased time Upper Body Bathing: Set up, Supervision/ safety, Sitting Upper Body Bathing Details (indicate cue type and reason): increased time Lower Body Bathing: Moderate assistance Lower Body Bathing Details (indicate cue type and reason): min A sit<>stand; increased time Upper Body Dressing : Minimal assistance, Sitting Upper Body Dressing Details (indicate cue type and reason): increased time Lower Body Dressing: Maximal assistance Lower Body Dressing Details (indicate cue type and reason): min A sit<>stand; increased time Toilet Transfer: Minimal assistance, Ambulation, RW Toilet Transfer Details (indicate cue type and reason): recliner>turn to bed Toileting- Clothing Manipulation and Hygiene: Moderate assistance Toileting - Clothing Manipulation Details (indicate cue type and reason): min A sit<>stand  Cognition: Cognition Overall Cognitive Status: Within Functional Limits for tasks  assessed Cognition Arousal/Alertness: Awake/alert Behavior During Therapy: WFL for tasks assessed/performed Overall Cognitive Status: Within Functional Limits for tasks assessed  Blood pressure 114/69, pulse 97, temperature 98.4 F (36.9 C), temperature source Oral, resp. rate 20, height 6\' 1"  (1.854 m), weight 82.3 kg (181 lb 8 oz), SpO2 97 %. Physical Exam  Vitals reviewed. Constitutional: He is oriented to person, place, and time. He appears well-developed and well-nourished.  HENT:  Head: Normocephalic and atraumatic.  Eyes: Conjunctivae and EOM are normal. Right eye exhibits no discharge. Left eye exhibits no discharge.  Neck: Normal range of motion. Neck supple. No thyromegaly present.  Cardiovascular: Normal rate and regular rhythm.   Respiratory: Effort normal and breath sounds normal. No respiratory distress.  GI: Soft. Bowel sounds are normal. He exhibits no distension.  Musculoskeletal: He exhibits edema (LLE>RLE, LUE). He exhibits no tenderness.  +2 edema lower extremities  Neurological: He is alert and oriented to person, place, and time.  Motor: B/l UE 4/5 proximal to distal RLE: HF 2+/5, KE 4/5, ADF/PF 4/5 LLE: HF 2/5, KE 4/5, ADF/PF 4/5 Sensation intact to light touch  Skin: Skin is warm and dry.  Psychiatric: He has a normal mood and affect. His behavior is normal.    Lab Results Last 24 Hours       Results for orders placed or performed during the hospital encounter of 05/31/16 (from the past 24 hour(s))  Troponin I     Status: Abnormal   Collection Time: 05/31/16  5:15 PM  Result Value Ref Range   Troponin I 0.53 (HH) <0.03 ng/mL  Troponin I     Status: Abnormal   Collection Time: 05/31/16 10:12 PM  Result Value Ref Range   Troponin I 0.80 (  HH) <0.03 ng/mL  Troponin I     Status: Abnormal   Collection Time: 06/01/16  4:37 AM  Result Value Ref Range   Troponin I 0.61 (HH) <0.03 ng/mL  Comprehensive metabolic panel     Status: Abnormal    Collection Time: 06/01/16  4:37 AM  Result Value Ref Range   Sodium 129 (L) 135 - 145 mmol/L   Potassium 4.0 3.5 - 5.1 mmol/L   Chloride 96 (L) 101 - 111 mmol/L   CO2 24 22 - 32 mmol/L   Glucose, Bld 84 65 - 99 mg/dL   BUN 14 6 - 20 mg/dL   Creatinine, Ser 1.610.70 0.61 - 1.24 mg/dL   Calcium 8.0 (L) 8.9 - 10.3 mg/dL   Total Protein 4.9 (L) 6.5 - 8.1 g/dL   Albumin 2.6 (L) 3.5 - 5.0 g/dL   AST 096187 (H) 15 - 41 U/L   ALT 112 (H) 17 - 63 U/L   Alkaline Phosphatase 47 38 - 126 U/L   Total Bilirubin 0.8 0.3 - 1.2 mg/dL   GFR calc non Af Amer >60 >60 mL/min   GFR calc Af Amer >60 >60 mL/min   Anion gap 9 5 - 15  CBC     Status: Abnormal   Collection Time: 06/01/16  4:37 AM  Result Value Ref Range   WBC 9.5 4.0 - 10.5 K/uL   RBC 3.78 (L) 4.22 - 5.81 MIL/uL   Hemoglobin 11.1 (L) 13.0 - 17.0 g/dL   HCT 04.533.4 (L) 40.939.0 - 81.152.0 %   MCV 88.4 78.0 - 100.0 fL   MCH 29.4 26.0 - 34.0 pg   MCHC 33.2 30.0 - 36.0 g/dL   RDW 91.414.8 78.211.5 - 95.615.5 %   Platelets 275 150 - 400 K/uL  CK     Status: Abnormal   Collection Time: 06/01/16  4:37 AM  Result Value Ref Range   Total CK 4,440 (H) 49 - 397 U/L  Cortisol-am, blood     Status: None   Collection Time: 06/01/16  8:33 AM  Result Value Ref Range   Cortisol - AM 15.2 6.7 - 22.6 ug/dL  Sedimentation rate     Status: None   Collection Time: 06/01/16  8:33 AM  Result Value Ref Range   Sed Rate 15 0 - 16 mm/hr  C-reactive protein     Status: Abnormal   Collection Time: 06/01/16  8:33 AM  Result Value Ref Range   CRP 1.5 (H) <1.0 mg/dL  Lactate dehydrogenase     Status: Abnormal   Collection Time: 06/01/16  8:33 AM  Result Value Ref Range   LDH 574 (H) 98 - 192 U/L      Imaging Results (Last 48 hours)  Dg Chest 2 View  Result Date: 05/31/2016 CLINICAL DATA:  Generalized weakness.  Shortness of breath. EXAM: CHEST  2 VIEW COMPARISON:  One-view chest x-ray 04/13/2015 FINDINGS: The heart is enlarged.  Atherosclerotic calcifications are present at the aortic arch. Diffuse interstitial and airspace disease is present. Bilateral effusions are present, left greater than right. Left greater than right basilar airspace disease is present. IMPRESSION: 1. Cardiomegaly with interstitial and airspace disease likely reflecting edema and bilateral effusions suggesting congestive heart failure. 2. Left greater than right pleural effusions and airspace disease. While this likely reflects atelectasis, infection is also considered. Electronically Signed   By: Marin Robertshristopher  Mattern M.D.   On: 05/31/2016 14:38   Ct Chest Wo Contrast  Result Date: 05/31/2016 CLINICAL DATA:  Dyspnea  EXAM: CT CHEST WITHOUT CONTRAST TECHNIQUE: Multidetector CT imaging of the chest was performed following the standard protocol without IV contrast. COMPARISON:  Chest x-ray 05/31/2016 FINDINGS: Cardiovascular: Limited vascular evaluation without intravenous contrast. Negative for aortic aneurysm. Mild atherosclerotic disease aortic arch. Extensive calcification in the left main and left anterior descending coronary artery and in the circumflex. Right coronary calcification. Heart size normal. Small pericardial effusion. Mediastinum/Nodes: No mass or adenopathy Lungs/Pleura: Moderate pleural effusion bilaterally. Bibasilar airspace disease may represent pneumonia or atelectasis. There is dense material in the lower lobe airways bilaterally which is likely aspirated material. No high-density material in the stomach. Upper lobes relatively clear. Upper Abdomen: Negative Musculoskeletal: Negative IMPRESSION: Moderately large bilateral pleural effusions. Small pericardial effusion Extensive coronary artery disease with heavy calcification left main and left coronary artery. Right coronary artery also calcified Bibasilar airspace disease which may be atelectasis or infiltrate. There is high-density material in the lower lobe airway bilaterally which is  likely aspirated material, possibly barium or other ingested material. No high-density material is seen in the stomach at this time. Electronically Signed   By: Marlan Palau M.D.   On: 05/31/2016 21:21     Assessment/Plan: Diagnosis: Debilitation Labs and images independently reviewed.  Records reviewed and summated above.  1. Does the need for close, 24 hr/day medical supervision in concert with the patient's rehab needs make it unreasonable for this patient to be served in a less intensive setting? Yes 2. Co-Morbidities requiring supervision/potential complications: hypothyroidism (cont meds), rhabdomyolysis (cont IVF, monitor labs), coronary artery disease (with hx of MI, cont meds, recs per cards, ?Cath), systolic CHF (Monitor in accordance with increased physical activity and avoid UE resistance excercises), Transaminitis (cont to monitor LFTs), hyponatremia (cont to monitor, supplement as necessary), elevated troponins (recs per cards) 3. Due to skin/wound care, disease management and patient education, does the patient require 24 hr/day rehab nursing? Yes 4. Does the patient require coordinated care of a physician, rehab nurse, PT (1-2 hrs/day, 5 days/week) and OT (1-2 hrs/day, 5 days/week) to address physical and functional deficits in the context of the above medical diagnosis(es)? Yes Addressing deficits in the following areas: balance, endurance, locomotion, strength, transferring, bathing, dressing, toileting and psychosocial support 5. Can the patient actively participate in an intensive therapy program of at least 3 hrs of therapy per day at least 5 days per week? Yes 6. The potential for patient to make measurable gains while on inpatient rehab is excellent 7. Anticipated functional outcomes upon discharge from inpatient rehab are modified independent  with PT, modified independent with OT, n/a with SLP. 8. Estimated rehab length of stay to reach the above functional goals is:  12-17 days. 9. Does the patient have adequate social supports and living environment to accommodate these discharge functional goals? Yes 10. Anticipated D/C setting: Home 11. Anticipated post D/C treatments: HH therapy and Home excercise program 12. Overall Rehab/Functional Prognosis: excellent  RECOMMENDATIONS: This patient's condition is appropriate for continued rehabilitative care in the following setting: If pt continues to have functional deficits after completion of medical workup, recommend CIR.   Patient has agreed to participate in recommended program. Yes Note that insurance prior authorization may be required for reimbursement for recommended care.  Comment: Rehab Admissions Coordinator to follow up.  Maryla Morrow, MD, [place] Dom Charlton Amor., PA-C 06/01/2016    Revision History                        Routing History

## 2016-06-11 NOTE — Progress Notes (Signed)
Fae PippinMelissa Ortencia [facility] Admission Coordinator Signed Physical Medicine and Rehabilitation  PMR Pre-admission Date of Service: 06/10/2016 4:03 PM  Related encounter: ED to Hosp-Admission (Discharged) from 05/31/2016 [facility] 2[facility] CARDI[facility] UN[facility]       [] Hide copied text PMR Admission Coordinator Pre-Admission Assessment  Patient: Connor Vargas is an 56 y.o., male MRN: [id number] DOB: 1960/08/16 Height: 6\' 1"  (185.4 cm) Weight: 80.3 kg (177 lb)                                                                                                                                                  Insurance Information HMO:     PPO: X     PCP:      IPA:      80/20:      OTHER:  PRIMARY: [employer] [place]       Policy#: [id number]: [id number]      Subscriber: Self        CM Name: Authorization Team       Phone#: Authorization Team Line: [phone]     Fax#: [phone] Pre-Cert#: [id number]  For 7 days 06/10/16-06/16/16 with expected discharge on 3/14; if patient needs longer faxed clinical updates with barriers to discharge are to be faxed to the Authorization Team    Employer: [employer] [place]  Benefits:  Phone #: [phone]     Name: automated line           Eff. Date: 04/06/13     Deduct: $750      Out of Pocket Max: $3,000      Life Max: N/A CIR: 80%/20%      SNF: 80%/20% Outpatient: 80%     Co-Pay: 20% Home Health: 80%      Co-Pay: 20% DME: 80%     Co-Pay: 20% Providers: in network   Medicaid Application Date:       Case Manager:  Disability Application Date:       Case Worker:   Emergency Contact Information        Contact Information    Name Relation Home Work Mobile   [name],[name] Son [phone]       Current Medical History  Patient Admitting Diagnosis: Debilitation   History of Present Illness: Connor Mrsicis a 56 y.o.right handed malewith history of recent diagnosed hypothyroidism with TSH a few months ago reportedly greater than Vargas. History taken from chart review and  patient. Placed on hormone supplement and titrated but continued to decline with generalized weakness. [facility] 2 weeks prior was later discharged to home. He was scheduled to meet with a rheumatologist March 6. Presented 05/31/2016 with progressive weakness lower extremities as well as shortness of breath. 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  He lives with his wifein a split-level home. Found to be hyponatremic 125, BNP 1328, TSH 15.602, CK 5482, sedimentation rate 15, troponin 0.59. CT of the chest without contrast showed moderately large bilateral pleural effusions.Underwent right effusion tap 06/04/2016 with 800 mL obtained and left effusion tap 06/05/2016 with a 500 mL yieldExtensive coronary artery disease with heavy calcification left main and left coronary artery. 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 Cardiology services consulted and plan for right and left heart catheterization once fluid status improved and optimized.. Patient remains on Synthroid as advised.In regards to bilateral lower extremity weakness with blood test for AntiJo+with ANApositive, elevated aldolaseall suggestive of POLYMYOSITIS. Rheumatology Dr. [facility] was contacted and advised intravenous Solu-Medrol monitoring of CPK slow prednisone taper. Hold on muscle biopsy at this time until CK level stable due to high risk of muscle necrosis. Latest CK levels improved 1736. Hyponatremia persist 129-130likely from fluid overload and hypothyroidism.Placed on subcutaneous Lovenox for DVT prophylaxis. Tolerating a regular diet. Therapy evaluation completed 06/01/2016 with recommendations of physical medicine rehabilitation consult. Patient was admitted for a comprehensive rehabilitation program 06/10/16.  Past Medical History        Past Medical History:  Diagnosis Date  . Bell's palsy    1980  . Thyroid disease     Family History  family history includes Coronary artery disease (age of onset: 20) in his mother.  Prior Rehab/Hospitalizations:  Has the patient had major surgery during 100 days prior to admission? No  Current Medications   Current Facility-Administered Medications:  .  aspirin EC tablet 81 mg, 81 mg, Oral, Daily, Dorothea Ogle, MD, 81 mg at 06/10/16 1141 .  enoxaparin (LOVENOX) injection 40 mg, 40 mg, Subcutaneous, Q24H, Dorothea Ogle, MD, 40 mg at 06/09/16 2144 .  furosemide (LASIX) tablet 80 mg, 80 mg, Oral, BID, Jake Bathe, MD .  hydrocortisone (ANUSOL-HC) suppository 25 mg, 25 mg, Rectal, BID, Dorothea Ogle, MD, 25 mg at 06/07/16 1029 .  hydrocortisone-pramoxine (PROCTOFOAM-HC) rectal foam 1 applicator, 1 applicator, Rectal, Once, Jacalyn Lefevre, MD .  levothyroxine (SYNTHROID, LEVOTHROID) tablet 175 mcg, 175 mcg, Oral, QAC breakfast, Dorothea Ogle, MD, 175 mcg at 06/10/16 [phone] .  lidocaine (XYLOCAINE) 5 % ointment, , Topical, TID PRN, Abigail Miyamoto, MD .  magnesium hydroxide (MILK OF MAGNESIA) suspension 15 mL, 15 mL, Oral, Daily PRN, Dorothea Ogle, MD .  magnesium hydroxide (MILK OF MAGNESIA) suspension 30 mL, 30 mL, Oral, BID, Dorothea Ogle, MD, 30 mL at 06/10/16 1141 .  ondansetron (ZOFRAN) tablet 4 mg, 4 mg, Oral, Q6H PRN **OR** ondansetron (ZOFRAN) injection 4 mg, 4 mg, Intravenous, Q6H PRN, Dorothea Ogle, MD, 4 mg at 06/01/16 1520 .  oxyCODONE (Oxy IR/ROXICODONE) immediate release tablet 5 mg, 5 mg, Oral, Q4H PRN, Dorothea Ogle, MD, 5 mg at 06/10/16 0514 .  pantoprazole (PROTONIX) EC tablet 40 mg, 40 mg, Oral, Q1200, Maretta Bees, MD .  predniSONE (DELTASONE) tablet 60 mg, 60 mg, Oral, Q breakfast, Shanker Levora Dredge, MD .  sodium chloride flush (NS) 0.9 % injection 3 mL, 3 mL, Intravenous, Q12H, Dorothea Ogle, MD, 3 mL at 06/10/16 1142 .  traZODone (DESYREL) tablet 50  mg, 50 mg, Oral, QHS PRN, Dorothea Ogle, MD .  witch hazel-glycerin (TUCKS) pad, , Topical, TID, Edson Snowball, PA-C  Patients Current Diet: Diet regular Room service appropriate? Yes; Fluid consistency: Thin; Fluid restriction: 1500 mL  Fluid Diet - low sodium heart healthy  Precautions / Restrictions Precautions Precautions: Fall Restrictions Weight Bearing Restrictions: No   Has the patient had 2 or more falls or a fall with injury in the past year?Yes, 1 fall with hospital admission on January 7th, 2018 reportedly due to dehydration   Prior Activity Level Community (5-7x/wk): Prior to admission patient was active and fully independent.  He worked full time and drove daily.  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     Home Assistive Devices / Equipment Home Assistive Devices/Equipment: None Home Equipment: None  Prior Device Use: Indicate devices/aids used by the patient prior to current illness, exacerbation or injury? None of the above  Prior Functional Level Prior Function Level of Independence: Independent Comments: Works full time  Self Care: Did the patient need help bathing, dressing, using the toilet or eating? Independent  Indoor Mobility: Did the patient need assistance with walking from room to room (with or without device)? Independent  Stairs: Did the patient need assistance with internal or external stairs (with or without device)? Independent  Functional Cognition: Did the patient need help planning regular tasks such as shopping or remembering to take medications? Independent  Current Functional Level Cognition  Overall Cognitive Status: Within Functional Limits for tasks assessed Orientation Level: Oriented X4 General Comments: pt emotional about medical condition since he was working 6 days a week before and now can hardly walk    Extremity Assessment (includes Sensation/Coordination)  Upper Extremity Assessment: Defer to  OT evaluation  Lower Extremity Assessment: Generalized weakness (noted edema, impaired coordination)    ADLs  Overall ADL's : Needs assistance/impaired Eating/Feeding: Set up, Supervision/ safety, Sitting Eating/Feeding Details (indicate cue type and reason): increased time Grooming: Standing, Min guard, Wash/dry hands, Designer, jewellery Details (indicate cue type and reason): increased time Upper Body Bathing: Set up, Sitting Upper Body Bathing Details (indicate cue type and reason): increased time Lower Body Bathing: Sit to/from stand, Minimal assistance (simulated) Lower Body Bathing Details (indicate cue type and reason): min A sit<>stand; increased time Upper Body Dressing : Set up, Supervision/safety, Sitting Upper Body Dressing Details (indicate cue type and reason): increased time Lower Body Dressing: Moderate assistance, Sit to/from stand Lower Body Dressing Details (indicate cue type and reason): simulated Toilet Transfer: Minimal assistance, Moderate assistance, Grab bars, Ambulation, Comfort height toilet Toilet Transfer Details (indicate cue type and reason): min A for stand - sit, mod A for sit - stand. Cues for correct hand placement Toileting- Clothing Manipulation and Hygiene: Minimal assistance, Sit to/from stand Toileting - Clothing Manipulation Details (indicate cue type and reason): min A sit<>stand Tub/ Shower Transfer: Minimal assistance, Rolling walker, Ambulation, Grab bars, 3 in 1 Functional mobility during ADLs: Minimal assistance, Rolling walker, Moderate assistance General ADL Comments: Proximal weakness makes LB ADL difficult. Pt also has weakness  in B shoulder girdles which makes UB dressing difficult. At times, pt demonstrates difficultywith  transitions anteriorly in sitting due to weak core muscles    Mobility  Overal bed mobility: Needs Assistance Bed Mobility: Supine to Sit Rolling: Min assist Sidelying to sit: Min assist Supine to sit: Min  assist Sit to supine: Mod assist General bed mobility comments: min A to elevate trunk, pt used rails    Transfers  Overall transfer level: Needs assistance Equipment used: Rolling walker (2 wheeled) Transfers: Sit to/from Stand Sit to Stand: Min assist General transfer comment: cues for correct hand placement    Ambulation / Gait /  Stairs / Psychologist, prison and probation services  Ambulation/Gait Ambulation/Gait assistance: Hydrographic surveyor (Feet): 150 Feet Assistive device: Rolling walker (2 wheeled) Gait Pattern/deviations: Step-through pattern, Decreased stride length General Gait Details: pt requiring >10 standing rest breaks due to SOB, onset of fatigue however pt determined to "make the loop" Gait velocity: decreased Gait velocity interpretation: <1.8 ft/sec, indicative of risk for recurrent falls    Posture / Balance Dynamic Sitting Balance Sitting balance - Comments: able to urinate in urinal at EOB without difficulty Balance Overall balance assessment: Needs assistance Sitting-balance support: Feet supported, No upper extremity supported Sitting balance-Leahy Scale: Good Sitting balance - Comments: able to urinate in urinal at EOB without difficulty Standing balance support: Bilateral upper extremity supported, Single extremity supported, During functional activity Standing balance-Leahy Scale: Poor Standing balance comment: requires RW for stability    Special needs/care consideration BiPAP/CPAP: No CPM: No Continuous Drip IV: No Dialysis: No         Life Vest: No Oxygen: No Special Bed: No Trach Size: No Wound Vac (area): No       Skin: WDL, Dry                               Bowel mgmt: 06/07/16  Bladder mgmt: Continent  Diabetic mgmt: No     Previous Home Environment Living Arrangements: Spouse/significant other, Children Available Help at Discharge: Family, Available PRN/intermittently Type of Home: House Home Layout: Multi-level Alternate Level  Stairs-Rails: Right Alternate Level Stairs-Number of Steps: 6-8 Home Access: Level entry Home Care Services: No  Discharge Living Setting Plans for Discharge Living Setting: Patient's home, House, Lives with (comment) (spouse and son) Type of Home at Discharge: House Discharge Home Layout: Multi-level, 1/2 bath on main level Alternate Level Stairs-Rails: Left Alternate Level Stairs-Number of Steps:  (7, then landing, then 7 more ) Discharge Home Access: Level entry Discharge Bathroom Shower/Tub: Tub/shower unit, Walk-in shower (has two bathrooms upstairs ) Discharge Bathroom Toilet: Standard Discharge Bathroom Accessibility: Yes How Accessible: Accessible via walker (reports one bathroom upstairs is accessible via walker ) Does the patient have any problems obtaining your medications?: No  Social/Family/Support Systems Patient Roles: Spouse, Parent, Other (Comment) (brother ) Contact Information: SonPAULMICHAEL Vargas [phone] Anticipated Caregiver: Patient with Mod I goals  Anticipated Caregiver's Contact Information: Please go through son whose English is better than spouse  Ability/Limitations of Caregiver: Wife, Sister, and Son all work full-time and are available intermittently  Caregiver Availability: Intermittent Discharge Plan Discussed with Primary Caregiver: Yes Is Caregiver In Agreement with Plan?: Yes Does Caregiver/Family have Issues with Lodging/Transportation while Pt is in Rehab?: No  Goals/Additional Needs Patient/Family Goal for Rehab: PT/OT Mod I  Expected length of stay: 7 days  Cultural Considerations: Patient is [place] but understands [place] well Dietary Needs: Regular and thin  Equipment Needs: TBD Special Service Needs: None Additional Information: N/A Pt/Family Agrees to Admission and willing to participate: Yes Program Orientation Provided & Reviewed with Pt/Caregiver Including Roles  & Responsibilities: Yes Additional Information Needs: Patient needs  to stay on steroids until seen by Rheumatologist as an outpatient  Information Needs to be Provided By: Medical Team    Decrease burden of Care through IP rehab admission: No  Possible need for SNF placement upon discharge: No  Patient Condition: This patient's medical and functional status has changed since the consult dated: 06/02/16 in which the Rehabilitation Physician determined and documented that the patient's condition is appropriate  for intensive rehabilitative care in an inpatient rehabilitation facility. See "History of Present Illness" (above) for medical update. Functional changes are: Min assist transfers and gait. Patient's medical and functional status update has been discussed with the Rehabilitation physician and patient remains appropriate for inpatient rehabilitation. Will admit to inpatient rehab today.  Preadmission Screen Completed By:  Fae Pippin, 06/10/2016 4:33 PM ______________________________________________________________________   Discussed status with Dr. Allena Katz on 06/10/16 at 1630 and received telephone approval for admission today.  Admission Coordinator:  Fae Pippin, time 1630/Date 06/10/16       Cosigned by: Ankit Karis Juba, MD at 06/10/2016 4:38 PM  Revision History

## 2016-06-11 NOTE — Progress Notes (Signed)
Patient information reviewed and entered into eRehab system by Tama Grosz, RN, CRRN, PPS Coordinator.  Information including medical coding and functional independence measure will be reviewed and updated through discharge.    

## 2016-06-12 ENCOUNTER — Inpatient Hospital Stay (HOSPITAL_COMMUNITY): Payer: BLUE CROSS/BLUE SHIELD | Admitting: Physical Therapy

## 2016-06-12 ENCOUNTER — Inpatient Hospital Stay (HOSPITAL_COMMUNITY): Payer: BLUE CROSS/BLUE SHIELD | Admitting: Occupational Therapy

## 2016-06-12 DIAGNOSIS — D72829 Elevated white blood cell count, unspecified: Secondary | ICD-10-CM

## 2016-06-12 DIAGNOSIS — R748 Abnormal levels of other serum enzymes: Secondary | ICD-10-CM | POA: Diagnosis present

## 2016-06-12 LAB — CK: Total CK: 1976 U/L — ABNORMAL HIGH (ref 49–397)

## 2016-06-12 NOTE — Progress Notes (Signed)
Occupational Therapy Session Note  Patient Details  Name: Connor Vargas MRN: [id number] Date of Birth: 09/26/1960  Today's Date: 06/12/2016 OT Individual Time: [id number] OT Individual Time Calculation (min): 75 min    Short Term Goals: Week 1:  OT Short Term Goal 1 (Week 1): STGs=LTGs secondary to short estimated LOS  Skilled Therapeutic Interventions/Progress Updates: Pt was lying in bed at time of arrival with family present. He was agreeable to session. Dressing completed at EOB with supervision with pt educated on threading L leg first due to weakness. [facility] supervision and w/c follow per request. Tx focus on standing endurance/LE strengthening by engaging in bilateral UE activities on elevated table. Blue foam cushion used to increase balance challenges. Pt was able to stand for 17 minutes without rest or LOB. Afterwards pt reported feeling too fatigued to ambulate or self propel back to room. Pt was returned to room by therapist in w/c and left in care of family at time of departure.       Therapy Documentation Precautions:  Precautions Precautions: Fall Restrictions Weight Bearing Restrictions: No General:   Vital Signs: Therapy Vitals BP: (!) 94/56 Pain: Pt c/o hemorrhoid pain when standing, subsided with rest per report    ADL:      See Function Navigator for Current Functional Status.   Therapy/Group: Individual Therapy  Trase Bunda A Jushua Waltman 06/12/2016, 12:23 PM

## 2016-06-12 NOTE — Progress Notes (Signed)
[place] PHYSICAL MEDICINE & REHABILITATION     PROGRESS NOTE  Subjective/Complaints:  Pt seen laying in bed this AM.  He states he had a very good day of therapies yesterday and slept well overnight.    ROS: Denies CP, SOB, N/V/D.  Objective: Vital Signs: Blood pressure (!) 98/58, pulse 72, temperature 98 F (36.7 C), temperature source Oral, resp. rate 16, height 6\' 1"  (1.854 m), weight 75 kg (165 lb 5.5 oz), SpO2 99 %. No results found.  Recent Labs  06/10/16 2203 06/11/16 0543  WBC 12.2* 14.2*  HGB 12.0* 11.9*  HCT 37.5* 36.9*  PLT 282 248    Recent Labs  06/10/16 0217 06/10/16 2203 06/11/16 0543  NA 130*  --  133*  K 4.0  --  3.8  CL 95*  --  95*  GLUCOSE 103*  --  85  BUN 15  --  14  CREATININE 0.66 0.70 0.68  CALCIUM 8.3*  --  8.4*   CBG (last 3)  No results for input(s): GLUCAP in the last 72 hours.  Wt Readings from Last 3 Encounters:  06/12/16 75 kg (165 lb 5.5 oz)  06/10/16 80.3 kg (177 lb)  04/13/15 79.2 kg (174 lb 9.7 oz)    Physical Exam:  BP (!) 98/58 (BP Location: Left Arm)   Pulse 72   Temp 98 F (36.7 C) (Oral)   Resp 16   Ht 6\' 1"  (1.854 m)   Wt 75 kg (165 lb 5.5 oz)   SpO2 99%   BMI 21.81 kg/m  Constitutional: He appears well-developed and well-nourished.  HENT: Normocephalic and atraumatic.  Eyes: EOMI. No discharge.  Cardiovascular: RRR. No JVD.   Respiratory: Effort normal. Clear GI: Soft. Bowel sounds are normal. He exhibits no distension. There is no tenderness.  Musculoskeletal: He exhibits edema. He exhibits no tenderness.  Neurological: He is alert and oriented.  Motor: B/l UE shoulder abduction 4-/5, elbow flex/ext 4/5, wrist/hand 4+/5  B/l LE: 4/5 proximally, 4+/5 distally (right slightly stronger than left) Skin: Skin is warm and dry.  Psychiatric: He has a normal mood and affect. His behavior is normal.    Assessment/Plan: 1. 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 Physiatrist is providing close team supervision and 24 hour management of active medical problems listed below. Physiatrist and rehab team continue to assess barriers to discharge/monitor patient progress toward functional and medical goals.  Function:  Bathing Bathing position   Position: Shower  Bathing parts Body parts bathed by patient: Right arm, Left arm, Chest, Abdomen, Front perineal area, Right upper leg, Left upper leg Body parts bathed by helper: Buttocks, Right lower leg, Left lower leg, Back  Bathing assist Assist Level:  (mod A)      Upper Body Dressing/Undressing Upper body dressing   What is the patient wearing?: Hospital gown                Upper body assist Assist Level: Set up      Lower Body Dressing/Undressing Lower body dressing   What is the patient wearing?: Underwear, Ted Hose, Non-skid slipper socks Underwear - Performed by patient: Pull underwear up/down Underwear - Performed by helper: Thread/unthread right underwear leg, Thread/unthread left underwear leg       Non-skid slipper socks- Performed by helper: Don/doff right sock, Don/doff left sock  TED Hose - Performed by helper: Don/doff right TED hose, Don/doff left TED hose  Lower body assist Assist for lower body dressing:  (max A)      Toileting Toileting Toileting activity did not occur: No continent bowel/bladder event        Toileting assist     Transfers Chair/bed transfer Chair/bed transfer activity did not occur: Safety/medical concerns   Chair/bed transfer assist level: Touching or steadying assistance (Pt > 75%) Chair/bed transfer assistive device: Patent attorneyWalker     Locomotion Ambulation     Max distance: 20 Assist level: Touching or steadying assistance (Pt > 75%)   Wheelchair          Cognition Comprehension Comprehension assist level: Understands basic 75 - 89% of the time/  requires cueing 10 - 24% of the time  Expression Expression assist level: Expresses basic 75 - 89% of the time/requires cueing 10 - 24% of the time. Needs helper to occlude trach/needs to repeat words.  Social Interaction Social Interaction assist level: Interacts appropriately with others - No medications needed.  Problem Solving Problem solving assist level: Solves complex problems: With extra time  Memory Memory assist level: Complete Independence: No helper    Medical Problem List and Plan: 1.  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 Plan muscle biopsy once CKs stabilized. Continue Solu-Medrol as directed  Cont CIR  CK trending pending 3/9 2.  DVT Prophylaxis/Anticoagulation: Subcutaneous Lovenox. Monitor platelet counts and any signs of bleeding. Patient is ambulatory 3. Pain Management: Oxycodone as needed 4. Mood: Support 5. Neuropsych: This patient is capable of making decisions on his own behalf. 6. Skin/Wound Care: Routine skin checks 7. Fluids/Electrolytes/Nutrition: Routine I&Os 8. 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 Monitor for any signs of fluid overload. Lasix 80 mg twice a day  Follow-up cardiology services needed. Filed Weights   06/10/16 2115 06/11/16 0544 06/12/16 0537  Weight: 79.4 kg (175 lb) 79.4 kg (175 lb 0.7 oz) 75 kg (165 lb 5.5 oz)   ?Reliability 9. CAD. Significant coronary calcification noted on CT imaging. Plan right and left heart catheterization once fluid status stabilized and optimized.   Follow-up per cardiology services 10. Hypothyroidism. Synthroid 175 mcg daily 11. Hyponatremia.   Na+ 133 on 3/8  Labs ordered for Monday  Cont to monitor 12. Constipation with Hemorrhoids. Sitz baths and proctoscopy foam cream.   Reg increased 3/8 13. Leukocytosis  Likely Steroid induced  14.2 on 3/8  Labs ordered for Monday  Cont to monitor 14. Hypoalbuminemia  Supplement initiated  3/8 15. ABLA  Hb 11.9 on 3/8  Labs ordered for Monday  LOS (Days) 2 A FACE TO FACE EVALUATION WAS PERFORMED  Clearence Vitug Karis Jubanil Jo Booze 06/12/2016 8:22 AM

## 2016-06-12 NOTE — Progress Notes (Signed)
Physical Therapy Session Note  Patient Details  Name: Connor Vargas MRN: [id number] Date of Birth: April 28, 1960  Today's Date: 06/12/2016 PT Individual Time: 0905-0950 PT Individual Time Calculation (min): 45 min   Short Term Goals: Week 1:  PT Short Term Goal 1 (Week 1): = LTGs due to anticipated LOS  Skilled Therapeutic Interventions/Progress Updates:    no c/o pain.  Session focus on activity tolerance, gait training, transfers, and balance.    Pt transfers throughout session with supervision and use of BUEs.  Gait training, max distance 200', throughout unit with RW and close supervision on non-compliant and compliant surfaces as well as up/down ramp and negotiating a curb step.  Pt completes car transfer with mod assist to boost from low car seat.  PT instructed pt in [place] (results below) and interpreted fall risk for pt.  Pt returned to room in w/c at end of session and positioned seated EOB with supervision.  Call bell in reach and needs met.   Therapy Documentation Precautions:  Precautions Precautions: Fall Restrictions Weight Bearing Restrictions: No   Balance: Standardized Balance Assessment Standardized Balance Assessment: Berg Balance Test Berg Balance Test Sit to Stand: Able to stand  independently using hands Standing Unsupported: Able to stand safely 2 minutes Sitting with Back Unsupported but Feet Supported on Floor or Stool: Able to sit safely and securely 2 minutes Stand to Sit: Controls descent by using hands Transfers: Able to transfer safely, minor use of hands Standing Unsupported with Eyes Closed: Able to stand 10 seconds safely Standing Ubsupported with Feet Together: Able to place feet together independently and stand 1 minute safely From Standing, Reach Forward with Outstretched Arm: Can reach forward >12 cm safely (5") From Standing Position, Pick up Object from Floor: Unable to pick up and needs supervision From Standing Position, Turn to  Look Behind Over each Shoulder: Looks behind one side only/other side shows less weight shift Turn 360 Degrees: Able to turn 360 degrees safely one side only in 4 seconds or less Standing Unsupported, Alternately Place Feet on Step/Stool: Able to complete >2 steps/needs minimal assist Standing Unsupported, One Foot in Front: Able to plae foot ahead of the other independently and hold 30 seconds Standing on One Leg: Tries to lift leg/unable to hold 3 seconds but remains standing independently Total Score: 41   See Function Navigator for Current Functional Status.   Therapy/Group: Individual Therapy  Dolph Tavano E Penven-Crew 06/12/2016, 9:50 AM

## 2016-06-12 NOTE — Progress Notes (Signed)
Occupational Therapy Session Note  Patient Details  Name: Renae FickleRadovan Suhre MRN: [id number] Date of Birth: 05-Sep-1960  Today's Date: 06/12/2016 OT Individual Time: 1350-1415 OT Individual Time Calculation (min): 25 min    Short Term Goals: Week 1:  OT Short Term Goal 1 (Week 1): STGs=LTGs secondary to short estimated LOS  Skilled Therapeutic Interventions/Progress Updates:    Treatment session with focus on sit > stand, standing balance, and activity tolerance.  Pt received seated EOB reporting just having a BM and now dealing with pain due to hemorrhoids, requesting to remain in room for therapy session.  Engaged in "Blink" in standing on foam surface to challenge dynamic balance.  Pt required mod assist for sit > stand from low bed height.  Maintained standing 4- 5 min x2 before requesting seated rest break due to pain from hemorrhoids.  Left seated EOB with family present.  Therapy Documentation Precautions:  Precautions Precautions: Fall Restrictions Weight Bearing Restrictions: No General:   Vital Signs: Therapy Vitals Temp: 97.8 F (36.6 C) Temp Source: Oral Pulse Rate: 66 Resp: 18 BP: (!) 95/54 Patient Position (if appropriate): Sitting Oxygen Therapy SpO2: 100 % O2 Device: Not Delivered Pain: Pain Assessment Pain Assessment: No/denies pain  See Function Navigator for Current Functional Status.   Therapy/Group: Individual Therapy  Rosalio LoudHOXIE, Lachelle Rissler 06/12/2016, 3:26 PM

## 2016-06-12 NOTE — Progress Notes (Signed)
Physical Therapy Session Note  Patient Details  Name: Connor FickleRadovan Pollina MRN: [id number] Date of Birth: 05-02-1960  Today's Date: 06/12/2016 PT Individual Time: 1515-1610 PT Individual Time Calculation (min): 55 min   Short Term Goals: Week 1:  PT Short Term Goal 1 (Week 1): = LTGs due to anticipated LOS  Skilled Therapeutic Interventions/Progress Updates:   Patient in bed upon arrival, c/o hemorrhoid pain. Patient initiated sitting EOB but family member brought BLE off bed and elevated patient's trunk despite cues to allow patient to attempt task first. Reinforced need for patient to attempt tasks before family assisting without carryover. Patient requested time to sit before continuing with therapy to "position hemorrhoids." While patient sat EOB, retrieved 18 x 18 wheelchair with Roho hybrid elite cushion for improved sitting tolerance and pain management. Patient ambulated to gym initially with and then without RW with close supervision x 200 ft. Patient performed NuStep using BLE only at level 5 x 5 min + level 4 x 5 min to focus on BLE neuro re-ed and muscular endurance after seated rest break on Nustep to "position hemorrhoids." Stair training up/down 4 (6") and 8 (3") stairs using 2 rails with supervision and one seated rest break. Patient ambulated back to room using RW with 3 standing rest breaks with supervision and left sitting in wheelchair with needs in reach and family present.   Therapy Documentation Precautions:  Precautions Precautions: Fall Restrictions Weight Bearing Restrictions: No Vital Signs: Seated BP 127/82 Pain: Pain Assessment Pain Assessment: 0-10 Pain Score: 7  Pain Type: Acute pain Pain Location: Rectum (hemorrhoid pain) Pain Onset: On-going Pain Intervention(s): Repositioned;Rest  See Function Navigator for Current Functional Status.   Therapy/Group: Individual Therapy  Lora Glomski, Prudencio PairRebecca A 06/12/2016, 4:17 PM

## 2016-06-13 ENCOUNTER — Inpatient Hospital Stay (HOSPITAL_COMMUNITY): Payer: BLUE CROSS/BLUE SHIELD | Admitting: Occupational Therapy

## 2016-06-13 LAB — CK: CK TOTAL: 1592 U/L — AB (ref 49–397)

## 2016-06-13 NOTE — Progress Notes (Signed)
[facility] PHYSICAL MEDICINE & REHABILITATION     PROGRESS NOTE  Subjective/Complaints:  No issues overnight. Feels weak in the hips and thighs. Milder weakness in the shoulders  ROS: Denies CP, SOB, N/V/D.  Objective: Vital Signs: Blood pressure (!) 97/53, pulse 63, temperature 98.3 F (36.8 C), temperature source Oral, resp. rate 18, height 6' 1" (1.854 m), weight 78.3 kg (172 lb 9.6 oz), SpO2 99 %. No results found.  Recent Labs  06/10/16 2203 06/11/16 0543  WBC 12.2* 14.2*  HGB 12.0* 11.9*  HCT 37.5* 36.9*  PLT 282 248    Recent Labs  06/10/16 2203 06/11/16 0543  NA  --  133*  K  --  3.8  CL  --  95*  GLUCOSE  --  85  BUN  --  14  CREATININE 0.70 0.68  CALCIUM  --  8.4*   CBG (last 3)  No results for input(s): GLUCAP in the last 72 hours.  Wt Readings from Last 3 Encounters:  06/13/16 78.3 kg (172 lb 9.6 oz)  06/10/16 80.3 kg (177 lb)  04/13/15 79.2 kg (174 lb 9.7 oz)    Physical Exam:  BP (!) 97/53 (BP Location: Right Arm)   Pulse 63   Temp 98.3 F (36.8 C) (Oral)   Resp 18   Ht 6' 1" (1.854 m)   Wt 78.3 kg (172 lb 9.6 oz)   SpO2 99%   BMI 22.77 kg/m  Constitutional: He appears well-developed and well-nourished.  HENT: Normocephalic and atraumatic.  Eyes: EOMI. No discharge.  Cardiovascular: RRR. No JVD.   Respiratory: Effort normal. Clear GI: Soft. Bowel sounds are normal. He exhibits no distension. There is no tenderness.  Musculoskeletal: He exhibits edema. He exhibits no tenderness.  Neurological: He is alert and oriented.  Motor: B/l UE shoulder abduction 4-/5, elbow flex/ext 4/5, wrist/hand 4+/5  B/l LE: 4/5 proximally, 4+/5 distally (right slightly stronger than left) Skin: Skin is warm and dry.  Psychiatric: He has a normal mood and affect. His behavior is normal.    Assessment/Plan: 1. 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 Physiatrist is providing close team supervision and 24 hour management of active medical problems listed below. Physiatrist and rehab team continue to assess barriers to discharge/monitor patient progress toward functional and medical goals.  Function:  Bathing Bathing position   Position: Shower  Bathing parts Body parts bathed by patient: Right arm, Left arm, Chest, Abdomen, Front perineal area, Right upper leg, Left upper leg Body parts bathed by helper: Buttocks, Right lower leg, Left lower leg, Back  Bathing assist Assist Level:  (mod A)      Upper Body Dressing/Undressing Upper body dressing   What is the patient wearing?: Pull over shirt/dress     Pull over shirt/dress - Perfomed by patient: Thread/unthread right sleeve, Thread/unthread left sleeve, Put head through opening, Pull shirt over trunk          Upper body assist Assist Level: Set up   Set up : To obtain clothing/put away  Lower Body Dressing/Undressing Lower body dressing   What is the patient wearing?: Pants, Ted Hose, Non-skid slipper socks Underwear - Performed by patient: Pull underwear up/down Underwear - Performed by helper: Thread/unthread right underwear leg, Thread/unthread left underwear leg Pants- Performed by patient: Thread/unthread left pants leg, Thread/unthread right pants leg, Pull pants up/down     Non-skid slipper socks- Performed by helper:  Don/doff right sock, Don/doff left sock               TED Hose - Performed by helper: Don/doff right TED hose, Don/doff left TED hose  Lower body assist Assist for lower body dressing:  (Mod A)      Toileting Toileting Toileting activity did not occur: No continent bowel/bladder event        Toileting assist     Transfers Chair/bed transfer Chair/bed transfer activity did not occur: Safety/medical concerns Chair/bed transfer method: Ambulatory Chair/bed transfer assist level: Touching or steadying assistance (Pt >  75%) Chair/bed transfer assistive device: Walker, Air cabin crew     Max distance: 200 Assist level: Supervision or verbal cues   Wheelchair          Cognition Comprehension Comprehension assist level: Understands basic 75 - 89% of the time/ requires cueing 10 - 24% of the time  Expression Expression assist level: Expresses basic 75 - 89% of the time/requires cueing 10 - 24% of the time. Needs helper to occlude trach/needs to repeat words.  Social Interaction Social Interaction assist level: Interacts appropriately with others - No medications needed.  Problem Solving Problem solving assist level: Solves complex problems: With extra time  Memory Memory assist level: Complete Independence: No helper    Medical Problem List and Plan: 1.  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 Plan muscle biopsy once CKs stabilized. Continue Solu-Medrol as directed, ESR was normal  Cont CIR PT, OT,  CK stable 1572 2.  DVT Prophylaxis/Anticoagulation: Subcutaneous Lovenox. Monitor platelet counts and any signs of bleeding. Patient is ambulatory 3. Pain Management: Oxycodone as needed 4. Mood: Support 5. Neuropsych: This patient is capable of making decisions on his own behalf. 6. Skin/Wound Care: Routine skin checks 7. Fluids/Electrolytes/Nutrition: Routine I&Os 8. 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 Monitor for any signs of fluid overload. Lasix 80 mg twice a day  Follow-up cardiology services needed. Filed Weights   06/11/16 0544 06/12/16 0537 06/13/16 0647  Weight: 79.4 kg (175 lb 0.7 oz) 75 kg (165 lb 5.5 oz) 78.3 kg (172 lb 9.6 oz)  stable 9. CAD. Significant coronary calcification noted on CT imaging. Plan right and left heart catheterization once fluid status stabilized and optimized.   Follow-up per cardiology services 10. Hypothyroidism. Synthroid 175 mcg daily 11. Hyponatremia.   Na+ 133 on  3/8  Labs ordered for Monday  Cont to monitor 12. Constipation with Hemorrhoids. Sitz baths and proctoscopy foam cream.   Reg increased 3/8 13. Leukocytosis- afebrile  Likely Steroid induced  14.2 on 3/8  Labs ordered for Monday  Cont to monitor 14. Hypoalbuminemia  Supplement initiated 3/8 15. ABLA  Hb 11.9 on 3/8  Labs ordered for Monday  LOS (Days) 3 A FACE TO FACE EVALUATION WAS PERFORMED  Ginnifer Creelman E 06/13/2016 10:30 AM

## 2016-06-13 NOTE — Progress Notes (Signed)
Occupational Therapy Session Note  Patient Details  Name: Connor FickleRadovan Olenik MRN: [id number] Date of Birth: [birth year]  Today's Date: 06/13/2016 OT Individual Time: [id number] OT Individual Time Calculation (min): 28 min    Short Term Goals: Week 1:  OT Short Term Goal 1 (Week 1): STGs=LTGs secondary to short estimated LOS  Skilled Therapeutic Interventions/Progress Updates: Skilled OT session completed with focus on dynamic standing balance and endurance during IADL completion. Pt was sitting EOB with family present at time of arrival, agreeable to session. He engaged in bedmaking task, requiring close supervision while ambulating around bed without device or bilateral UE support. Had him engage in dynamic tasks including tucking fitted sheets under mattress and placing pillows in pillowcases while standing. He required 1 rest break due to fatigue. No LOBs, min c/o hemorrhoid pain. At end of session pt was left at EOB with family.      Therapy Documentation Precautions:  Precautions Precautions: Fall Restrictions Weight Bearing Restrictions: No  Pain: Pt medicated prior to session, was provided rest breaks during tx    ADL:    See Function Navigator for Current Functional Status.   Therapy/Group: Individual Therapy  Nataliee Shurtz A Savannah Erbe 06/13/2016, 12:43 PM

## 2016-06-14 ENCOUNTER — Inpatient Hospital Stay (HOSPITAL_COMMUNITY): Payer: BLUE CROSS/BLUE SHIELD | Admitting: Occupational Therapy

## 2016-06-14 ENCOUNTER — Inpatient Hospital Stay (HOSPITAL_COMMUNITY): Payer: BLUE CROSS/BLUE SHIELD

## 2016-06-14 ENCOUNTER — Inpatient Hospital Stay (HOSPITAL_COMMUNITY): Payer: BLUE CROSS/BLUE SHIELD | Admitting: Physical Therapy

## 2016-06-14 LAB — CK: CK TOTAL: 1767 U/L — AB (ref 49–397)

## 2016-06-14 NOTE — Progress Notes (Signed)
Social Work  Social Work Assessment and Plan  Patient Details  Name: Connor FickleRadovan Saefong MRN: [id number] Date of Birth: 1961-03-02  Today's Date: 06/12/2016  Problem List:  Patient Active Problem List   Diagnosis Date Noted  . Elevated CK   . Acute blood loss anemia   . Leucocytosis   . Hypoalbuminemia due to protein-calorie malnutrition (HCC)   . Polymyositis (HCC)   . Acute pulmonary edema (HCC)   . Acute combined systolic and diastolic congestive heart failure (HCC)   . Pain   . Slow transit constipation   . Hemorrhoids   . Pleural effusion   . S/P thoracentesis   . Hyponatremia   . Hypoalbuminemia   . Elevated troponin   . Myxedema   . Cardiomyopathy (HCC)   . Coronary artery disease involving native coronary artery of native heart without angina pectoris   . Acute systolic congestive heart failure (HCC)   . Pulmonary vascular congestion 05/31/2016  . Non-traumatic rhabdomyolysis 05/31/2016  . Transaminitis 05/31/2016  . Acquired hypothyroidism   . External hemorrhoids 04/14/2015  . Syncope 04/13/2015  . CAP (community acquired pneumonia) 04/13/2015  . Weakness 04/13/2015   Past Medical History:  Past Medical History:  Diagnosis Date  . Bell's palsy    1980  . Thyroid disease    Past Surgical History: History reviewed. No pertinent surgical history. Social History:  reports that he has never smoked. He has never used smokeless tobacco. He reports that he drinks alcohol. He reports that he does not use drugs.  Family / Support Systems Marital Status: Married Patient Roles: Spouse, Parent Spouse/Significant Other: wife does not speak English very well Children: son, PJ Fox @ (C) [phone] Anticipated Caregiver: Patient with Mod I goals  Ability/Limitations of Caregiver: Wife, Sister, and Son all work full-time and are available intermittently  Caregiver Availability: Intermittent Family Dynamics: Family very supportive and encouraging to pt.  Social  History Preferred language: [place] Religion: Other Cultural Background: [place] originally from [place]  He and family moved to [place] in 1994. Education: college Read: Yes Write: Yes Employment Status: Employed Name of Employer: [employer] Length of Employment:  (since 2004) Return to Work Plans: Pt fully intends to be able to return to his work once medically cleared to do so. Legal Hisotry/Current Legal Issues: None Guardian/Conservator: None - per MD, pt is capable of making decisions on his own behalf.   Abuse/Neglect Physical Abuse: Denies Verbal Abuse: Denies Sexual Abuse: Denies Exploitation of patient/patient's resources: Denies Self-Neglect: Denies  Emotional Status Pt's affect, behavior adn adjustment status: Pt pleasant and able to complete assessment interview without difficulty.  He admits much frustration with his overall weakness and feels he may need longer on CIR than estimated by team.  He denies any significant emotional distress, however, wiill monitor and likely refer for neuropsychology to evaluate. Recent Psychosocial Issues: None Pyschiatric History: None Substance Abuse History: none  Patient / Family Perceptions, Expectations & Goals Pt/Family understanding of illness & functional limitations: Pt able to offer very good description of his medical issues and details per what his MDs have explained.  Good understanding of his current functional limitations/ need for CIR. Premorbid pt/family roles/activities: Pt was completely independent and working f/t - notes usually much greater that 40 hrs/week Anticipated changes in roles/activities/participation: goals are set for mod ind - little change in roles anticipated but wife and other family will need to offer some support. Pt/family expectations/goals: "I just want to be stronger.Marland Kitchen.Marland Kitchen.I may need a  little longer than they are saying."  [employer]: None Premorbid Home Care/DME Agencies:  None Transportation available at discharge: yes Resource referrals recommended: Neuropsychology  Discharge Planning Living Arrangements: Spouse/significant other, Children Support Systems: Spouse/significant other, Children, Other relatives, Architect, Friends/neighbors Type of Residence: Private residence Insurance Resources: Media planner (specify) ([employer] [place]) Financial Resources: Employment Surveyor, quantity Screen Referred: No Living Expenses: Database administrator Management: Patient Does the patient have any problems obtaining your medications?: No Home Management: pt and family share responsibilities Patient/Family Preliminary Plans: Pt to return home with wife and adult son.  Intermittent support available. Social Work Anticipated Follow Up Needs: HH/OP Expected length of stay: 7 days   Clinical Impression Very pleasant gentleman who is originally from [place] but in the [place] since 2004.  Good english and able to complete assessment interview without difficulty.  He has a very good understanding of his medical issues, current functional limitations.  Denies any significant emotional distress beyond general frustration.  Will follow for support and d/c planning needs.  Siera Beyersdorf 06/12/2016, 4:05 PM

## 2016-06-14 NOTE — Progress Notes (Signed)
Physical Therapy Session Note  Patient Details  Name: Connor FickleRadovan Vargas MRN: [id number] Date of Birth: [birth year]  Today's Date: 06/14/2016 PT Individual Time: 1104-1200 PT Individual Time Calculation (min): 56 min   Short Term Goals: Week 1:  PT Short Term Goal 1 (Week 1): = LTGs due to anticipated LOS  Skilled Therapeutic Interventions/Progress Updates:  Pt received in bed & agreeable to tx, noting significant fatigue in general & in BUE 2/2 OT session this AM. Pt's sister present for session and very involved in pt's care, not allowing pt much personal space at times. Pt transferred supine>sitting EOB with supervision and use of hospital bed features. Pt reported need to use restroom and utilized urinal sitting EOB with set up assist; (+) continent void. Pt ambulated room>gym with RW & supervision before requiring seated rest break. Pt negotiated 8 steps (6") with B rails progressing to L rail only to simulate home environment. Pt appears anxious when negotiating stairs with single rail, as he frequently reaches for B rails despite max cuing not to do so; pt also reports he cannot hold single rail with both hands. Pt engaged in zoom ball while standing for 2 minutes + 2 minutes with task focusing on standing tolerance and BUE endurance/strengthening. Pt utilized nu-step on level 4 with BLE only with task focusing on BLE strengthening & endurance training. Pt reported 6-7/10 RPE during activity. Pt able to perform activity for 5 minutes + 2.5 minutes + 1 minute with rest breaks in between. At end of session pt left sitting in w/c in room with sister present & all needs within reach.   Throughout session pt required frequent seated rest breaks 2/2 fatigue.   Therapy Documentation Precautions:  Precautions Precautions: Fall Restrictions Weight Bearing Restrictions: No   Pain: Denied c/o pain at beginning of session but noted BUE pain at end; RN made aware.   See Function Navigator for Current  Functional Status.   Therapy/Group: Individual Therapy  Sandi MariscalVictoria M Andrena Vargas 06/14/2016, 12:05 PM

## 2016-06-14 NOTE — Progress Notes (Signed)
[place] PHYSICAL MEDICINE & REHABILITATION     PROGRESS NOTE  Subjective/Complaints:  Patient feels like his upper extremity strength is progressing nicely, but his hip strength is still very poor. He is hoping for a few more days past his Wednesday discharge.  ROS: Denies CP, SOB, N/V/D.  Objective: Vital Signs: Blood pressure (!) 97/54, pulse 68, temperature 98.3 F (36.8 C), temperature source Oral, resp. rate 18, height 6' 1"  (1.854 m), weight 78.3 kg (172 lb 9.6 oz), SpO2 100 %. No results found. No results for input(s): WBC, HGB, HCT, PLT in the last 72 hours. No results for input(s): NA, K, CL, GLUCOSE, BUN, CREATININE, CALCIUM in the last 72 hours.  Invalid input(s): CO CBG (last 3)  No results for input(s): GLUCAP in the last 72 hours.  Wt Readings from Last 3 Encounters:  06/13/16 78.3 kg (172 lb 9.6 oz)  06/10/16 80.3 kg (177 lb)  04/13/15 79.2 kg (174 lb 9.7 oz)    Physical Exam:  BP (!) 97/54 (BP Location: Right Arm)   Pulse 68   Temp 98.3 F (36.8 C) (Oral)   Resp 18   Ht 6' 1"  (1.854 m)   Wt 78.3 kg (172 lb 9.6 oz)   SpO2 100%   BMI 22.77 kg/m  Constitutional: He appears well-developed and well-nourished.  HENT: Normocephalic and atraumatic.  Eyes: EOMI. No discharge.  Cardiovascular: RRR. No JVD.   Respiratory: Effort normal. Clear GI: Soft. Bowel sounds are normal. He exhibits no distension. There is no tenderness.  Musculoskeletal: He exhibits edema. He exhibits no tenderness.  Neurological: He is alert and oriented.  Motor: B/l UE shoulder abduction 4-/5, elbow flex/ext 4/5, wrist/hand 4+/5  B/l LE: 4/5 proximally, 4+/5 distally (right slightly stronger than left) Skin: Skin is warm and dry.  Psychiatric: He has a normal mood and affect. His behavior is normal.    Assessment/Plan: 1. 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 Physiatrist is providing close team supervision and 24 hour management of active medical problems listed below. Physiatrist and rehab team continue to assess barriers to discharge/monitor patient progress toward functional and medical goals.  Function:  Bathing Bathing position   Position: Shower  Bathing parts Body parts bathed by patient: Right arm, Left arm, Chest, Abdomen, Front perineal area, Right upper leg, Left upper leg Body parts bathed by helper: Buttocks, Right lower leg, Left lower leg, Back  Bathing assist Assist Level:  (mod A)      Upper Body Dressing/Undressing Upper body dressing   What is the patient wearing?: Pull over shirt/dress     Pull over shirt/dress - Perfomed by patient: Thread/unthread right sleeve, Thread/unthread left sleeve, Put head through opening, Pull shirt over trunk          Upper body assist Assist Level: Set up   Set up : To obtain clothing/put away  Lower Body Dressing/Undressing Lower body dressing   What is the patient wearing?: Pants, Ted Hose, Non-skid slipper socks Underwear - Performed by patient: Pull underwear up/down Underwear - Performed by helper: Thread/unthread right underwear leg, Thread/unthread left underwear leg Pants- Performed by patient: Thread/unthread left pants leg, Thread/unthread right pants leg, Pull pants up/down     Non-skid slipper socks- Performed by helper: Don/doff right sock, Don/doff left sock               TED Hose - Performed by helper: Don/doff right  TED hose, Don/doff left TED hose  Lower body assist Assist for lower body dressing:  (Mod A)      Toileting Toileting Toileting activity did not occur: No continent bowel/bladder event Toileting steps completed by patient: Adjust clothing prior to toileting, Performs perineal hygiene, Adjust clothing after toileting      Toileting assist     Transfers Chair/bed transfer Chair/bed transfer activity did not occur: Safety/medical  concerns Chair/bed transfer method: Ambulatory Chair/bed transfer assist level: Touching or steadying assistance (Pt > 75%) Chair/bed transfer assistive device: Walker, Air cabin crew     Max distance: 200 Assist level: Supervision or verbal cues   Wheelchair          Cognition Comprehension Comprehension assist level: Understands complex 90% of the time/cues 10% of the time  Expression Expression assist level: Expresses complex 90% of the time/cues < 10% of the time  Social Interaction Social Interaction assist level: Interacts appropriately with others - No medications needed.  Problem Solving Problem solving assist level: Solves complex problems: With extra time  Memory Memory assist level: Complete Independence: No helper    Medical Problem List and Plan: 1.  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 Plan muscle biopsy once CKs stabilized. 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 Expect slow improvement over the next several months.  CK stable 1767 2.  DVT Prophylaxis/Anticoagulation: Subcutaneous Lovenox. Monitor platelet counts and any signs of bleeding. Patient is ambulatory 3. Pain Management: Oxycodone as needed 4. Mood: Support 5. Neuropsych: This patient is capable of making decisions on his own behalf. 6. Skin/Wound Care: Routine skin checks 7. Fluids/Electrolytes/Nutrition: Routine I&Os 8. 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 Monitor for any signs of fluid overload. Lasix 80 mg twice a day  Follow-up cardiology services needed. Filed Weights   06/11/16 0544 06/12/16 0537 06/13/16 0647  Weight: 79.4 kg (175 lb 0.7 oz) 75 kg (165 lb 5.5 oz) 78.3 kg (172 lb 9.6 oz)  stable 9. CAD. Significant coronary calcification noted on CT imaging. Plan right and left heart catheterization once  fluid status stabilized and optimized.   Follow-up per cardiology services 10. Hypothyroidism. Synthroid 175 mcg daily 11. Hyponatremia.   Na+ 133 on 3/8  Labs ordered for Monday  Cont to monitor 12. Constipation with Hemorrhoids. Sitz baths and proctoscopy foam cream.   Reg increased 3/8 13. Leukocytosis- afebrile  Likely Steroid induced  14.2 on 3/8  Labs ordered for Monday 3/12  Cont to monitor 14. Hypoalbuminemia  Supplement initiated 3/8 15. ABLA  Hb 11.9 on 3/8  Labs ordered for Monday  LOS (Days) 4 A FACE TO FACE EVALUATION WAS PERFORMED  Charlett Blake 06/14/2016 9:41 AM

## 2016-06-14 NOTE — Progress Notes (Addendum)
Occupational Therapy Session Note  Patient Details  Name: Connor Vargas MRN: [id number] Date of Birth: 28-May-1960  Today's Date: 06/14/2016 OT Individual Time: [id number] and 1400-1444 OT Individual Time Calculation (min): 59 min and 44 minutes  Short Term Goals: Week 1:  OT Short Term Goal 1 (Week 1): STGs=LTGs secondary to short estimated LOS  Skilled Therapeutic Interventions/Progress Updates: Pt was lying in bed at time of arrival with family member Connor Vargas present. Tx focus on ADL retraining at shower level, dynamic balance, endurance, and activity tolerance. Pt ambulated with RW to shower chair and supervision. Bathing completed with therapist standing outside of ajar door to bathroom. Pt requested for this secondary to modesty with ADLs. Per pt, he was able to bathe 10/10 body parts with extra time while seated. He donned shorts/gripper socks while on shower chair and then ambulated to EOB to finish UB/LB dressing (without device and min guard). Pt very fatigued at this point, required extra time to don the rest of his clothes with min guard-supervision. Teds donned with Total A and Connor Vargas educated on adaptive technique. Discussed f/u therapies and DME needs for home. He has walk in shower and tub shower on 3rd split level (needs to ascend 14 steps in total). He would like either tub bench or chair for home. Will address further at later session. Discussed having BSC placed over toilet in home for increased ease with sit<stands. Pt required Mod A to complete sit<stand from low toilet to simulate transfer this AM. He reports feeling 20% of his baseline in terms of endurance, strength, and overall function, verbalized strong desire to stay at CIR until he reaches baseline again. At end of session pt was left at EOB with Connor Vargas with lunch set up and needs within reach.     2nd Session 1:1 tx (44 min) Pt was lying in bed at time of arrival, reported fatigue but agreeable to participate in tx. Tx focus  on functional transfers and d/c planning. Pt ambulated with RW to therapy apartment and supervision. Had him practice tub bench transfers and shower stall transfers with RW to assess which DME he preferred for home. Discussed benefits/cons of both options. Pt decided on having tub bench for home for increased ease of bathing while seated. He will not have grab bars in shower at home and therefore pt does not plan to stand while bathing at time of discharge. Sit<stand transitions from elevated DME surfaces completed without UE support for LE strengthening and close supervision. Pt required Mod A for sit<stands from low shower chair. After he ambulated back to room with RW and supervision, discussed toilet options for hospital room/home. Pt reports having difficultly with his low toilet in room. [facility], encouraged to transfer as much as possible without UE assist for LE strengthening. Without UE support, he completed sit<stand from elevated toilet with Mod A (due to fatigue). With UE support, he did this with supervision. At end of session pt was left with daughter Connor Vargas and all needs within reach.   Therapy Documentation Precautions:  Precautions Precautions: Fall Restrictions Weight Bearing Restrictions: No   Pain: Pt reported pain to be manageable with rest breaks.  Pain Assessment Pain Assessment: 0-10 Pain Score: 5  Pain Type: Acute pain Pain Location: Shoulder Pain Orientation: Right Pain Descriptors / Indicators: Aching Pain Frequency: Intermittent Pain Onset: Gradual Pain Intervention(s): Medication (See eMAR) ADL:      See Function Navigator for Current Functional Status.   Therapy/Group: Individual Therapy  Judiann Celia A Gehrig Patras 06/14/2016, 12:30 PM

## 2016-06-14 NOTE — Progress Notes (Addendum)
Physical Therapy Note  Patient Details  Name: Renae FickleRadovan Dermody MRN: [id number] Date of Birth: 06-Dec-1960 Today's Date: 06/14/2016  1300-1333, 33 min individual tx Pain: no pain reported  Gait on level tile with RW, min assist to stand.  Pt needed cues not to pick up RW during turns.  Standing Therapeutic exercise performed with LE to increase strength for functional mobility.with bil UE support :: 10 x 1 calf raises, mini squats, R/L hip abduction.  Sit>< stand using Bobath method and/or hands on knees  for wt shift forward and focus on quad and hip ext to rise.  Pt left resting in w/c with quick release belt applied and all needs within reach.  See function navigator for current status.   Venna Berberich 06/14/2016, 12:28 PM

## 2016-06-15 ENCOUNTER — Inpatient Hospital Stay (HOSPITAL_COMMUNITY): Payer: BLUE CROSS/BLUE SHIELD | Admitting: Physical Therapy

## 2016-06-15 ENCOUNTER — Inpatient Hospital Stay (HOSPITAL_COMMUNITY): Payer: BLUE CROSS/BLUE SHIELD | Admitting: Occupational Therapy

## 2016-06-15 LAB — BASIC METABOLIC PANEL
Anion gap: 8 (ref 5–15)
BUN: 24 mg/dL — ABNORMAL HIGH (ref 6–20)
CALCIUM: 8.3 mg/dL — AB (ref 8.9–10.3)
CO2: 32 mmol/L (ref 22–32)
CREATININE: 0.7 mg/dL (ref 0.61–1.24)
Chloride: 93 mmol/L — ABNORMAL LOW (ref 101–111)
Glucose, Bld: 80 mg/dL (ref 65–99)
Potassium: 3.7 mmol/L (ref 3.5–5.1)
SODIUM: 133 mmol/L — AB (ref 135–145)

## 2016-06-15 LAB — CBC WITH DIFFERENTIAL/PLATELET
BASOS ABS: 0 10*3/uL (ref 0.0–0.1)
BASOS PCT: 0 %
EOS ABS: 0.1 10*3/uL (ref 0.0–0.7)
Eosinophils Relative: 1 %
HCT: 38.5 % — ABNORMAL LOW (ref 39.0–52.0)
HEMOGLOBIN: 12.4 g/dL — AB (ref 13.0–17.0)
Lymphocytes Relative: 12 %
Lymphs Abs: 1.5 10*3/uL (ref 0.7–4.0)
MCH: 28.6 pg (ref 26.0–34.0)
MCHC: 32.2 g/dL (ref 30.0–36.0)
MCV: 88.9 fL (ref 78.0–100.0)
MONOS PCT: 10 %
Monocytes Absolute: 1.2 10*3/uL — ABNORMAL HIGH (ref 0.1–1.0)
NEUTROS PCT: 77 %
Neutro Abs: 9.6 10*3/uL — ABNORMAL HIGH (ref 1.7–7.7)
Platelets: 227 10*3/uL (ref 150–400)
RBC: 4.33 MIL/uL (ref 4.22–5.81)
RDW: 14.9 % (ref 11.5–15.5)
WBC: 12.4 10*3/uL — ABNORMAL HIGH (ref 4.0–10.5)

## 2016-06-15 LAB — CK: CK TOTAL: 1809 U/L — AB (ref 49–397)

## 2016-06-15 NOTE — Progress Notes (Signed)
Occupational Therapy Session Note  Patient Details  Name: [name] MRN: [id number] Date of Birth: 1960/07/16  Today's Date: 06/15/2016 OT Individual Time: 1305-1420 OT Individual Time Calculation (min): 75 min    Short Term Goals: Week 1:  OT Short Term Goal 1 (Week 1): STGs=LTGs secondary to short estimated LOS  Skilled Therapeutic Interventions/Progress Updates:    Pt seen this session to focus on activity tolerance, standing balance, LE strength, L shoulder pain. Pt ambulated with RW to gym with contact guard.  Mat activities in sitting: -L shoulder AROM with education for upright posture for improved scapular alignment.  Push pull exercises and external AROM ex.  Education to complete in room also.  -sit to stand 10x with hands on thighs, 10 x with hands holding dowel - forced use of each leg with crossed leg exercise and rocking hip to hip Mat activities in supine: - L shoulder at 90 degrees from trunk for holding with arm circles in 10, out 10 -bridges 10 x followed by 10 sec hold: 1st set feet placed in neutral, 2nd set feet closer to hips, 3rd set feet on ball Standing exercises: -Up/down 3" stairs with reciprocal stepping pattern 2 sets (contact guard) -standing at end of 6 " step using B rails: Step R, L up and down 15 x, rest followed by step L, R up and down 15 x, rest Step tap ups with L foot on, R tap 10x, rest and then opposite foot Step ups with R foot only up/down 15x, rest, repeated with L  Pt did need short rest breaks but was able to tolerate the long session well.  His wife and sister were present for observation/ education. Pt ambulated back to room with RW with contact guard. Pt resting in recliner with all needs met.    Therapy Documentation Precautions:  Precautions Precautions: Fall Restrictions Weight Bearing Restrictions: No   Pain: Pain Assessment Pain Assessment: 0-10 Pain Score: 5  Pain Type: Acute pain Pain Location: Shoulder Pain  Orientation: Left Pain Descriptors / Indicators: Aching Pain Frequency: Intermittent Pain Onset: With Activity Pain Intervention(s): Medication (See eMAR) ADL:   See Function Navigator for Current Functional Status.   Therapy/Group: Individual Therapy  [place] 06/15/2016, 1:01 PM

## 2016-06-15 NOTE — Care Management Note (Signed)
[facility] Individual Statement of Services  Patient Name:  Connor Vargas  Date:  06/12/2016  Welcome to the [facility].  Our goal is to provide you with an individualized program based on your diagnosis and situation, designed to meet your specific needs.  With this comprehensive rehabilitation program, you will be expected to participate in at least 3 hours of rehabilitation therapies Monday-Friday, with modified therapy programming on the weekends.  Your rehabilitation program will include the following services:  Physical Therapy (PT), Occupational Therapy (OT), 24 hour per day rehabilitation nursing, Therapeutic Recreaction (TR), Neuropsychology, Case Management (Social Worker), Rehabilitation Medicine, Nutrition Services and Pharmacy Services  Weekly team conferences will be held on Wednesdays to discuss your progress.  Your Social Worker will talk with you frequently to get your input and to update you on team discussions.  Team conferences with you and your family in attendance may also be held.  Expected length of stay: 7 days     Overall anticipated outcome: modified independent  Depending on your progress and recovery, your program may change. Your Social Worker will coordinate services and will keep you informed of any changes. Your Social Worker's name and contact numbers are listed  below.  The following services may also be recommended but are not provided by the [facility]:   Driving Evaluations  Home Health Rehabiltiation Services  Outpatient Rehabilitation Services  Vocational Rehabilitation   Arrangements will be made to provide these services after discharge if needed.  Arrangements include referral to agencies that provide these services.  Your insurance has been verified to be:  [employer] [place] Your primary doctor is:  Dr. Jeannetta NapElkins  Pertinent information will be shared with your doctor and your insurance  company.  Social Worker:  MassacLucy Eladia Frame, [place] [phone] or ([phone]) [phone]   Information discussed with and copy given to patient by: Amada JupiterHOYLE, Trany Chernick, 06/12/2016, 2:01 PM

## 2016-06-15 NOTE — Progress Notes (Signed)
Occupational Therapy Session Note  Patient Details  Name: Connor Vargas MRN: [id number] Date of Birth: Dec 19, 1960  Today's Date: 06/15/2016 OT Individual Time: 0700-0750 OT Individual Time Calculation (min): 50 min    Short Term Goals: Week 1:  OT Short Term Goal 1 (Week 1): STGs=LTGs secondary to short estimated LOS  Skilled Therapeutic Interventions/Progress Updates:    Upon entering the room, pt supine in bed with wife present in the room. Pt with no c/o pain but reports, "I am tired from yesterday but I will try." Pt agreeable to OT intervention. Pt declined bathing but requesting to change clothing. Pt ambulating with RW to bathroom with close supervision. Pt standing at sink for 9 minutes to shave and brush teeth with overall supervision. Pt returns to sit on EOB to don clothing items. Family still attempting to be very involved in treatments but pt asks wife to not assist him with actual tasks this session. Pt donned UB and LB clothing with overall supervision. Pt ambulating 100' to ADL apartment with close supervision and taking seated rest break before returning. Pt states, " I am too tired." Pt returned to bed with call bell and all needed items within reach upon exiting the room.   Therapy Documentation Precautions:  Precautions Precautions: Fall Restrictions Weight Bearing Restrictions: No General:   Vital Signs: Therapy Vitals Temp: 98.4 F (36.9 C) Temp Source: Oral Pulse Rate: 66 Resp: 16 BP: 102/60 Patient Position (if appropriate): Lying Oxygen Therapy SpO2: 98 % O2 Device: Not Delivered  See Function Navigator for Current Functional Status.   Therapy/Group: Individual Therapy  [name], Lola Czerwonka P 06/15/2016, 7:55 AM

## 2016-06-15 NOTE — Progress Notes (Signed)
[place] PHYSICAL MEDICINE & REHABILITATION     PROGRESS NOTE  Subjective/Complaints:  Pt seen sitting up in bed this AM.  He slept well overnight.  He had a good weekend.  He states he would like to focus on exercises to improve his hip flexors.   ROS: Denies CP, SOB, N/V/D.  Objective: Vital Signs: Blood pressure 102/60, pulse 66, temperature 98.4 F (36.9 C), temperature source Oral, resp. rate 16, height 6\' 1"  (1.854 m), weight 78.3 kg (172 lb 9.9 oz), SpO2 98 %. No results found.  Recent Labs  06/15/16 0613  WBC 12.4*  HGB 12.4*  HCT 38.5*  PLT 227    Recent Labs  06/15/16 0613  NA 133*  K 3.7  CL 93*  GLUCOSE 80  BUN 24*  CREATININE 0.70  CALCIUM 8.3*   CBG (last 3)  No results for input(s): GLUCAP in the last 72 hours.  Wt Readings from Last 3 Encounters:  06/15/16 78.3 kg (172 lb 9.9 oz)  06/10/16 80.3 kg (177 lb)  04/13/15 79.2 kg (174 lb 9.7 oz)    Physical Exam:  BP 102/60 (BP Location: Right Arm)   Pulse 66   Temp 98.4 F (36.9 C) (Oral)   Resp 16   Ht 6\' 1"  (1.854 m)   Wt 78.3 kg (172 lb 9.9 oz)   SpO2 98%   BMI 22.77 kg/m  Constitutional: He appears well-developed and well-nourished.  HENT: Normocephalic and atraumatic.  Eyes: EOMI. No discharge.  Cardiovascular: RRR. No JVD.   Respiratory: Effort normal. Clear GI: Soft. Bowel sounds are normal.  Musculoskeletal: He exhibits no edema and no tenderness.  Neurological: He is alert and oriented.  Motor: B/l UE shoulder abduction 4+/5, elbow flex/ext 4+/5, wrist/hand 5/5  B/l LE: 2+/5 proximally, 4+/5 distally  Skin: Skin is warm and dry.  Psychiatric: He has a normal mood and affect. His behavior is normal.    Assessment/Plan: 1. 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 Physiatrist is providing close team supervision and 24 hour management of active medical problems  listed below. Physiatrist and rehab team continue to assess barriers to discharge/monitor patient progress toward functional and medical goals.  Function:  Bathing Bathing position   Position: Shower  Bathing parts Body parts bathed by patient: Right arm, Left arm, Chest, Abdomen, Front perineal area, Right upper leg, Left upper leg, Buttocks, Right lower leg, Left lower leg, Back (per pt report) Body parts bathed by helper: Buttocks, Right lower leg, Left lower leg, Back  Bathing assist Assist Level: Supervision or verbal cues      Upper Body Dressing/Undressing Upper body dressing   What is the patient wearing?: Pull over shirt/dress     Pull over shirt/dress - Perfomed by patient: Thread/unthread right sleeve, Thread/unthread left sleeve, Put head through opening, Pull shirt over trunk          Upper body assist Assist Level: Supervision or verbal cues   Set up : To obtain clothing/put away  Lower Body Dressing/Undressing Lower body dressing   What is the patient wearing?: Underwear, Pants Underwear - Performed by patient: Thread/unthread right underwear leg, Thread/unthread left underwear leg, Pull underwear up/down Underwear - Performed by helper: Thread/unthread right underwear leg, Thread/unthread left underwear leg Pants- Performed by patient: Thread/unthread left pants leg, Thread/unthread right pants leg, Pull pants up/down   Non-skid slipper socks- Performed by patient: Don/doff right sock, Don/doff left  sock Non-skid slipper socks- Performed by helper: Don/doff right sock, Don/doff left sock               TED Hose - Performed by helper: Don/doff right TED hose, Don/doff left TED hose  Lower body assist Assist for lower body dressing: Supervision or verbal cues      Toileting Toileting Toileting activity did not occur: No continent bowel/bladder event Toileting steps completed by patient: Adjust clothing prior to toileting, Performs perineal hygiene, Adjust  clothing after toileting      Toileting assist     Transfers Chair/bed transfer Chair/bed transfer activity did not occur: Safety/medical concerns Chair/bed transfer method: Stand pivot Chair/bed transfer assist level: Touching or steadying assistance (Pt > 75%) Chair/bed transfer assistive device: Patent attorney     Max distance: 100 Assist level: Supervision or verbal cues   Wheelchair          Cognition Comprehension Comprehension assist level: Understands complex 90% of the time/cues 10% of the time  Expression Expression assist level: Expresses complex 90% of the time/cues < 10% of the time  Social Interaction Social Interaction assist level: Interacts appropriately with others - No medications needed.  Problem Solving Problem solving assist level: Solves complex problems: With extra time  Memory Memory assist level: Complete Independence: No helper    Medical Problem List and Plan: 1.  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 Plan muscle biopsy once CKs stabilized. Continue Solu-Medrol as directed  Cont CIR   CK ?trending up 3/12  No issues per On call MD 2.  DVT Prophylaxis/Anticoagulation: Subcutaneous Lovenox. Monitor platelet counts and any signs of bleeding. Patient is ambulatory 3. Pain Management: Oxycodone as needed 4. Mood: Support 5. Neuropsych: This patient is capable of making decisions on his own behalf. 6. Skin/Wound Care: Routine skin checks 7. Fluids/Electrolytes/Nutrition: Routine I&Os 8. 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 Monitor for any signs of fluid overload. Lasix 80 mg twice a day  Follow-up cardiology services needed. Filed Weights   06/12/16 0537 06/13/16 0647 06/15/16 0448  Weight: 75 kg (165 lb 5.5 oz) 78.3 kg (172 lb 9.6 oz) 78.3 kg (172 lb 9.9 oz)   Stable 3/12 9. CAD. Significant coronary calcification noted on CT imaging. Plan right and left  heart catheterization once fluid status stabilized and optimized.   Follow-up per cardiology services 10. Hypothyroidism. Synthroid 175 mcg daily 11. Hyponatremia.   Na+ 133 on 3/12  Cont to monitor 12. Constipation with Hemorrhoids. Sitz baths and proctoscopy foam cream.   Reg increased 3/8 13. Leukocytosis  Likely Steroid induced  12.4 on 3/12  Labs ordered for Monday 3/12  Cont to monitor 14. Hypoalbuminemia  Supplement initiated 3/8 15. ABLA  Hb 12.4 on 3/12  LOS (Days) 5 A FACE TO FACE EVALUATION WAS PERFORMED  Michaelene Dutan Karis Juba 06/15/2016 9:01 AM

## 2016-06-15 NOTE — Progress Notes (Signed)
Physical Therapy Session Note  Patient Details  Name: Renae FickleRadovan Granja MRN: [id number] Date of Birth: 12/29/1960  Today's Date: 06/15/2016 PT Individual Time: 0900-1000 PT Individual Time Calculation (min): 60 min   Short Term Goals: Week 1:  PT Short Term Goal 1 (Week 1): = LTGs due to anticipated LOS  Skilled Therapeutic Interventions/Progress Updates:  Pt presented in bed agreeable to therapy. Stating would like to working on LE so that "legs can come up a little higher". Supine to sit with HOB elevated and supervision. Ambulated room to gym with RW with x 1 standing rest break. Pt required extended break once in gym due to per pt "soreness in arms and legs". Difficulty discerning initially if pain or fatigue from pt, nsg notified. NuStep LE only L3 x 10 min maintaining avg 30-40 steps per min. LE stregthening including standing SLR x 15, toe taps/hip flexion to 6 in x 15 bilaterally, LAQ with 2-3 sec hold, sit to/from stand from elevated height (20in) x 5 with UE assist, and standing hip abd/add x 15 bilaterally. Pt required seated rest due to increased fatigue in BLE. Pt returned to room in same manner as prior with no standing rests and left sitting at EOB with bed alarm on and wife present.      Therapy Documentation Precautions:  Precautions Precautions: Fall Restrictions Weight Bearing Restrictions: No General:   Vital Signs: Therapy Vitals Temp: 98.8 F (37.1 C) Temp Source: Oral Resp: 18 BP: 108/64 Patient Position (if appropriate): Sitting Oxygen Therapy SpO2: 98 % O2 Device: Not Delivered Pain:    See Function Navigator for Current Functional Status.   Therapy/Group: Individual Therapy  Jennica Tagliaferri  Aleksandr Pellow, PTA  06/15/2016, 3:59 PM

## 2016-06-16 ENCOUNTER — Inpatient Hospital Stay (HOSPITAL_COMMUNITY): Payer: BLUE CROSS/BLUE SHIELD | Admitting: Physical Therapy

## 2016-06-16 ENCOUNTER — Inpatient Hospital Stay (HOSPITAL_COMMUNITY): Payer: BLUE CROSS/BLUE SHIELD | Admitting: Occupational Therapy

## 2016-06-16 DIAGNOSIS — R5381 Other malaise: Secondary | ICD-10-CM | POA: Diagnosis present

## 2016-06-16 LAB — CK: CK TOTAL: 2017 U/L — AB (ref 49–397)

## 2016-06-16 MED ORDER — POLYETHYLENE GLYCOL 3350 17 G PO PACK
34.0000 g | PACK | Freq: Two times a day (BID) | ORAL | Status: DC
  Administered 2016-06-17: 34 g via ORAL
  Filled 2016-06-16 (×4): qty 2

## 2016-06-16 MED ORDER — METHYLPREDNISOLONE SODIUM SUCC 125 MG IJ SOLR
60.0000 mg | Freq: Every day | INTRAMUSCULAR | Status: DC
  Administered 2016-06-16 – 2016-06-20 (×5): 60 mg via INTRAVENOUS
  Filled 2016-06-16 (×5): qty 2

## 2016-06-16 MED ORDER — MILK AND MOLASSES ENEMA
1.0000 | Freq: Once | RECTAL | Status: DC
  Filled 2016-06-16: qty 250

## 2016-06-16 MED ORDER — SENNA 8.6 MG PO TABS
2.0000 | ORAL_TABLET | Freq: Every day | ORAL | Status: DC
  Administered 2016-06-17 – 2016-06-20 (×4): 17.2 mg via ORAL
  Filled 2016-06-16 (×4): qty 2

## 2016-06-16 MED ORDER — POLYETHYLENE GLYCOL 3350 17 G PO PACK: 17.0000 g | PACK | Freq: Every day | ORAL | Status: DC

## 2016-06-16 NOTE — Progress Notes (Signed)
[facility]     PROGRESS NOTE  Subjective/Complaints:  Pt seen sitting up at the edge of the bed working with therapies this AM.  He states his hemorrhoids flared up last night and has medication.    ROS: +Hemorrhoids. Denies CP, SOB, N/V/D.  Objective: Vital Signs: Blood pressure (!) 103/56, pulse 70, temperature 97.6 F (36.4 C), temperature source Oral, resp. rate 18, height 6\' 1"  (1.854 m), weight 63 kg (138 lb 14.2 oz), SpO2 100 %. No results found.  Recent Labs  06/15/16 0613  WBC 12.4*  HGB 12.4*  HCT 38.5*  PLT 227    Recent Labs  06/15/16 0613  NA 133*  K 3.7  CL 93*  GLUCOSE 80  BUN 24*  CREATININE 0.70  CALCIUM 8.3*   CBG (last 3)  No results for input(s): GLUCAP in the last 72 hours.  Wt Readings from Last 3 Encounters:  06/16/16 63 kg (138 lb 14.2 oz)  06/10/16 80.3 kg (177 lb)  04/13/15 79.2 kg (174 lb 9.7 oz)    Physical Exam:  BP (!) 103/56 (BP Location: Right Arm)   Pulse 70   Temp 97.6 F (36.4 C) (Oral)   Resp 18   Ht 6\' 1"  (1.854 m)   Wt 63 kg (138 lb 14.2 oz)   SpO2 100%   BMI 18.32 kg/m  Constitutional: He appears well-developed and well-nourished.  HENT: Normocephalic and atraumatic.  Eyes: EOMI. No discharge.  Cardiovascular: RRR. No JVD.   Respiratory: Effort normal. Clear GI: Soft. Bowel sounds are normal.  Musculoskeletal: He exhibits no edema and no tenderness.  Neurological: He is alert and oriented.  Motor: B/l UE shoulder abduction 4+/5, elbow flex/ext 4+/5, wrist/hand 5/5  B/l LE: 3+/5 proximally, 4+/5 distally  Skin: Skin is warm and dry.  Psychiatric: He has a normal mood and affect. His behavior is normal.    Assessment/Plan: 1. 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 Physiatrist is providing close team supervision and 24 hour management of active medical problems  listed below. Physiatrist and rehab team continue to assess barriers to discharge/monitor patient progress toward functional and medical goals.  Function:  Bathing Bathing position   Position: Shower  Bathing parts Body parts bathed by patient: Right arm, Left arm, Chest, Abdomen, Front perineal area, Right upper leg, Left upper leg, Buttocks, Right lower leg, Left lower leg, Back (per pt report) Body parts bathed by helper: Buttocks, Right lower leg, Left lower leg, Back  Bathing assist Assist Level: Supervision or verbal cues      Upper Body Dressing/Undressing Upper body dressing   What is the patient wearing?: Pull over shirt/dress     Pull over shirt/dress - Perfomed by patient: Thread/unthread right sleeve, Thread/unthread left sleeve, Put head through opening, Pull shirt over trunk          Upper body assist Assist Level: Supervision or verbal cues   Set up : To obtain clothing/put away  Lower Body Dressing/Undressing Lower body dressing   What is the patient wearing?: Underwear, Pants Underwear - Performed by patient: Thread/unthread right underwear leg, Thread/unthread left underwear leg, Pull underwear up/down Underwear - Performed by helper: Thread/unthread right underwear leg, Thread/unthread left underwear leg Pants- Performed by patient: Thread/unthread left pants leg, Thread/unthread right pants leg, Pull pants up/down   Non-skid slipper socks- Performed by patient: Don/doff right sock, Don/doff left sock Non-skid slipper  socks- Performed by helper: Don/doff right sock, Don/doff left sock               TED Hose - Performed by helper: Don/doff right TED hose, Don/doff left TED hose  Lower body assist Assist for lower body dressing: Supervision or verbal cues      Toileting Toileting   Toileting steps completed by patient: Adjust clothing prior to toileting, Performs perineal hygiene, Adjust clothing after toileting Toileting steps completed by helper:  Adjust clothing prior to toileting, Performs perineal hygiene, Adjust clothing after toileting (per Armeniahina Ingram, NT)    Toileting assist     Transfers Chair/bed transfer Chair/bed transfer activity did not occur: Safety/medical concerns Chair/bed transfer method: Stand pivot Chair/bed transfer assist level: Touching or steadying assistance (Pt > 75%) Chair/bed transfer assistive device: Patent attorneyWalker     Locomotion Ambulation     Max distance: 100 Assist level: Supervision or verbal cues   Wheelchair       Assist Level: Dependent (Pt equals 0%) (for transport when fatigued)  Cognition Comprehension Comprehension assist level: Understands complex 90% of the time/cues 10% of the time  Expression Expression assist level: Expresses complex 90% of the time/cues < 10% of the time  Social Interaction Social Interaction assist level: Interacts appropriately with others - No medications needed.  Problem Solving Problem solving assist level: Solves complex problems: With extra time  Memory Memory assist level: Complete Independence: No helper    Medical Problem List and Plan: 1.  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 Plan muscle biopsy once CKs stabilized. Continue Solu-Medrol as directed  Cont CIR   CK continue to trend up, will speak with IM 2.  DVT Prophylaxis/Anticoagulation: Subcutaneous Lovenox. Monitor platelet counts and any signs of bleeding. Patient is ambulatory 3. Pain Management: Oxycodone as needed 4. Mood: Support 5. Neuropsych: This patient is capable of making decisions on his own behalf. 6. Skin/Wound Care: Routine skin checks 7. Fluids/Electrolytes/Nutrition: Routine I&Os 8. 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 Monitor for any signs of fluid overload. Lasix 80 mg twice a day  Follow-up cardiology services needed. Filed Weights   06/13/16 0647 06/15/16 0448 06/16/16 0428  Weight: 78.3 kg (172 lb  9.6 oz) 78.3 kg (172 lb 9.9 oz) 63 kg (138 lb 14.2 oz)   Stable 3/13 9. CAD. Significant coronary calcification noted on CT imaging. Plan right and left heart catheterization once fluid status stabilized and optimized.   Follow-up per cardiology services 10. Hypothyroidism. Synthroid 175 mcg daily 11. Hyponatremia.   Na+ 133 on 3/12  Cont to monitor 12. Constipation with Hemorrhoids. Sitz baths and proctoscopy foam cream.   Reg increased 3/8 13. Leukocytosis  Likely Steroid induced  WBCs 12.4 on 3/12  Cont to monitor 14. Hypoalbuminemia  Supplement initiated 3/8 15. ABLA  Hb 12.4 on 3/12  LOS (Days) 6 A FACE TO FACE EVALUATION WAS PERFORMED  Francois Elk Karis Jubanil Quintrell Baze 06/16/2016 8:56 AM

## 2016-06-16 NOTE — Progress Notes (Addendum)
Occupational Therapy Session Note  Patient Details  Name: Renae FickleRadovan Brossman MRN: [id number] Date of Birth: 10-29-1960  Today's Date: 06/16/2016 OT Individual Time: 0704-0800 and 1300-1404 OT Individual Time Calculation (min): 56 min and 64 min   Short Term Goals: Week 1:  OT Short Term Goal 1 (Week 1): STGs=LTGs secondary to short estimated LOS  Skilled Therapeutic Interventions/Progress Updates:   Session 1: Upon entering the room, pt reports feeling unwell as he had large BM last night and now has c/o hemorid pain. Pt reports receiving medication for this issue prior to OT arrival. Pt declined all out of bed mobility this session secondary to pain. He was agreeable to B UE strengthening exercises with use of 3 lb resistive dowel rod. Pt performed 2 sets of 10 chest press, forward row, reverse row, straight arm raises, and bicep curls with rest breaks needed between each set. Pt needing min verbal cues and demonstrational cues for pursed lip breathing with exercise. Min verbal cues for proper technique. Pt remained seated on EOB with wife present in room as OT exited. Call bell and all needed items within reach.   Session 2: Upon entering the room, pt seated on EOB with sister and wife present in room. Pt with no c/o pain this session but does report fatigue from prior therapies. Pt is agreeable to OT intervention. Pt ambulating 100' with RW to dayroom with supervision. Pt engaged in modified sit ups with use of wedge on mat and pt utilized elbow to assist in pushing self up from wedge x 10 reps with rest breaks as needed. Pt performed seated trunk rotation exercises with lateral weighted ( 4 lbs) ball pass x 2 minutes in both directions. Pt engaged in stand with controlled sit 2 sets of 10 without use of UE support and alternating punches once in standing. Pt able to maintain balance at supervision level. Pt engaged in seated step up exercise with B LEs on 4 inch step x 20 reps each. Pt ambulated back  to room at end of session in same manner as above with call bell and all needed items within reach upon exiting the room.   Therapy Documentation Precautions:  Precautions Precautions: Fall Restrictions Weight Bearing Restrictions: No General:   Vital Signs:  Pain: Pain Assessment Faces Pain Scale: Hurts even more Pain Location: Rectum Pain Descriptors / Indicators: Burning Pain Intervention(s): Repositioned;Emotional support;Other (Comment) (monitored though session) ADL:   Exercises:   Other Treatments:    See Function Navigator for Current Functional Status.   Therapy/Group: Individual Therapy  [name], Bryn Perkin P 06/16/2016, 12:33 PM

## 2016-06-16 NOTE — Progress Notes (Signed)
Late entry. Patient participated in therapy today. Patient given sorbitol previous day and night shift RN and patient reported BM on 06-15-16 , not documented. Patient received solumedrol 60mg  IV at 1122 after IV NSL started. Patient verbalized dissatisfaction with previous regimen "not working". Patient reported feeling pain from let thigh up to left arm and mild dizziness approximately 1 hour after receiving solumedrol and was quickly resolved. Patient refused sitz bath at 1130 and refused new order for enema and senokot. Patient reported feeling pain all over and oxycodone 5mg  po given at 1516 with relief of pain. Patient reported at 1930 he felt better and did not have dizziness or fatigue. Continue with plan of care.  Cleotilde NeerJoyce, Kuzey Ogata S

## 2016-06-16 NOTE — Progress Notes (Signed)
Physical Therapy Session Note  Patient Details  Name: Trinidad Petron MRN: [id number] Date of Birth: 06/10/1960  Today's Date: 06/16/2016 PT Individual Time: 0905-1000 PT Individual Time Calculation (min): 55 min   Short Term Goals: Week 1:  PT Short Term Goal 1 (Week 1): = LTGs due to anticipated LOS  Skilled Therapeutic Interventions/Progress Updates: Pt presented sitting at EOB speaking with nsg. Pt agreeable to therapy however pt stating increased pain/discomfort due to hemorrhoids causing lack of sleep previous night (nsg aware). Pt ambulated to rehab gym >161f with no rest breaks demonstrating good cadence and equal step length. Pt required extended rest after gait/activities throughout session for pain management. Performed [place] exercises and education provided to pt and wife during session. Pt with overall good tolerance during session and maintaining safety with activities. Pt ambulated back to room in same manner as prior and remained sitting at EOB with alarm set and wife present with all needs met.       Therapy Documentation Precautions:  Precautions Precautions: Fall Restrictions Weight Bearing Restrictions: No General:   Vital Signs:  Pain: Pain Assessment Faces Pain Scale: Hurts even more Pain Location: Rectum Pain Descriptors / Indicators: Burning Pain Intervention(s): Repositioned;Emotional support;Other (Comment) (monitored though session) Other Treatments:     See Function Navigator for Current Functional Status.   Therapy/Group: Individual Therapy  Lysha Schrade  Leroi Haque, PTA  06/16/2016, 12:18 PM

## 2016-06-16 NOTE — Consult Note (Signed)
Consultation Note   Connor Vargas WUJ:[id number] DOB: June 05, 1960 DOA: 06/10/2016   PCP: Connor Vargas,Connor Vargas, Connor Vargas   Patient coming from/Resides with:   Requesting physician: Connor Vargas/CIR  Reason for consultation: Elevated CK  HPI: Connor Vargas is a 56 y.o. male with medical history significant for diagnosed hypothyroidism several months ago TSH greater than 90 with PCP adjusting Synthroid. Early February admitted to [facility] for presumed myxedematous state. No changes in Synthroid done at that time and patient was scheduled to see his rheumatologist on March 6. Patient presented to our ER on 2/25 with progressive weakness, bilateral upper and lower extremity swelling, dyspnea on exertion and orthopnea. Over the past several months patient's physical conditioning has declined to the point where he could not bear weight on his on and was having extensive extremity pain and was requiring 2 people to assist him with mobility. 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  During the acute care portion of the hospitalization here patient was discovered to have new combined systolic and diastolic heart failure with an ejection fraction of 25%. Cardiology was consulted and assisted with the management of the heart failure. Recommendations were to follow up with cardiology after discharge to discuss plans to pursue right/left heart catheterization once patient better conditioned. Etiology to the cardiopathy myopathy unclear although ischemic etiology as well as related to hypothyroidism and the differential. He did require bilateral thoracentesis for heart failure related pleural effusions. During that admission it was discovered the patient had a CK of greater than 5000. Blood test for AntiJo Ab + with ANA positive, elevated aldolase, all suggestive of Polymyositis. Patient's hypothyroidism much better controlled with recent TSH down to 15 during previous acute care admission. No inpatient  r[facility]umatology available at [facility] therefore attending physician Dr. Izola Vargas spoke with Dr. [name] with [facility] who agreed the blood work was suggestive of polymyositis. Recommendations were for IV Solu-Medrol 60 mg daily until CK down in the 1000s range. After that patient was to be on an extended steroid taper until could be followed up with rheumatology as an outpatient. On 3/7 patient lost IV access and CK was 1570 therefore the decision was made to begin prednisone 60 mg daily. At this point patient's Lasix 40 mg IV twice a day was transitioned to 80 mg by mouth twice a day. Patient was subsequently discharged to CIR on the same date with recommendations to follow CK closely.  Since arrival to CIR patient has done well from a physical therapy standpoint with his only limitations being secondary to pain related to severe hemorrhoids. Since the adjustment in the Solu-Medrol patient CK briefly went up to 1976 but then went back down the 1592. Over the next several days CK has trended back upward steadily to 2017 prompting a request for internal medicine evaluation.   Review of Systems:  In addition to the HPI above,  No Fever-chills, myalgias or other constitutional symptoms No Headache, changes with Vision or hearing, no change in known extremity weakness, tingling, numbness in any extremity, dizziness, dysarthria or word finding difficulty, no new gait disturbance or imbalance, tremors or seizure activity No problems swallowing food or Liquids, indigestion/reflux, choking or coughing while eating, abdominal pain with or after eating No Chest pain, Cough or Shortness of Breath, palpitations, orthopnea or DOE No Abdominal pain, N/V, melena,hematochezia, dark tarry stools No dysuria, malodorous urine, hematuria or flank pain No new skin rashes, lesions, masses or bruises, No new joint pains, aches, swelling  or redness No recent unintentional weight gain or loss No polyuria, polydypsia or  polyphagia   Past Medical History:  Diagnosis Date  . Bell's palsy    1980  . Thyroid disease     History reviewed. No pertinent surgical history.  Social History   Social History  . Marital status: Married    Spouse name: N/A  . Number of children: N/A  . Years of education: N/A   Occupational History  . Not on file.   Social History Main Topics  . Smoking status: Never Smoker  . Smokeless tobacco: Never Used  . Alcohol use Yes  . Drug use: No  . Sexual activity: Not on file   Other Topics Concern  . Not on file   Social History Narrative  . No narrative on file    Mobility: Actively participate eating and physical therapy; utilizes rolling walker Work history: Engineer, mining   No Known Allergies  Family History  Problem Relation Age of Onset  . Coronary artery disease Mother 26     Prior to Admission medications   Medication Sig Start Date End Date Taking? Authorizing Provider  aspirin EC 81 MG tablet Take 81 mg by mouth daily.    Historical Provider, Connor Vargas  docusate sodium (COLACE) 100 MG capsule Take 1 capsule (100 mg total) by mouth 2 (two) times daily. 04/15/15   Connor Vargas, Connor Vargas  furosemide (LASIX) 80 MG tablet Take 1 tablet (80 mg total) by mouth 2 (two) times daily. 06/10/16   Connor Vargas, Connor Vargas  hydrocortisone (ANUSOL-HC) 25 MG suppository Place 1 suppository (25 mg total) rectally 2 (two) times daily. Patient not taking: Reported on 05/31/2016 04/15/15   Connor Vargas, Connor Vargas  levothyroxine (SYNTHROID, LEVOTHROID) 175 MCG tablet Take 175 mcg by mouth daily before breakfast.    Historical Provider, Connor Vargas  pantoprazole (PROTONIX) 40 MG tablet Take 1 tablet (40 mg total) by mouth daily at 12 noon. 06/10/16   Connor Vargas, Connor Vargas  predniSONE (DELTASONE) 20 MG tablet Take 3 tablets (60 mg total) by mouth daily with breakfast. 06/10/16   Connor Vargas, Connor Vargas  senna (SENOKOT) 8.6 MG tablet Take 1 tablet by mouth daily.    Historical Provider, Connor Vargas  traMADol  (ULTRAM) 50 MG tablet Take 50 mg by mouth every 6 (six) hours as needed for moderate pain.    Historical Provider, Connor Vargas    Physical Exam: Vitals:   06/14/16 1307 06/15/16 0448 06/15/16 1438 06/16/16 0428  BP: 102/62 102/60 108/64 (!) 103/56  Pulse: 76 66  70  Resp: 12 16 18 18   Temp: 97.8 F (36.6 C) 98.4 F (36.9 C) 98.8 F (37.1 C) 97.6 F (36.4 C)  TempSrc: Oral Oral Oral Oral  SpO2: 99% 98% 98% 100%  Weight:  78.3 kg (172 lb 9.9 oz)  63 kg (138 lb 14.2 oz)  Height:          Constitutional: NAD, calm, comfortable-Appears deconditioned and somewhat underweight-seated in wheelchair in hallway Eyes: PERRL, lids and conjunctivae normal ENMT: Mucous membranes are dry. Posterior pharynx clear of any exudate or lesions.Normal dentition.  Neck: normal, supple, no masses, no thyromegaly Respiratory: clear to auscultation bilaterally, no wheezing, no crackles. Normal respiratory effort. No accessory muscle use.  Cardiovascular: Regular rate and rhythm, no murmurs / rubs / gallops. No extremity edema. 2+ pedal pulses. No carotid bruits.  Abdomen: no tenderness, no masses palpated. No hepatosplenomegaly. Bowel sounds positive.  Musculoskeletal: no clubbing / cyanosis. No joint  deformity upper and lower extremities. Good ROM, no contractures. Generalized weak muscle tone noted with atrophy/wasting of proximal muscle groups. Skin: no rashes, lesions, ulcers. No induration Neurologic: CN 2-12 grossly intact. Sensation intact, DTR normal. Strength 4/5 x all 4 extremities with proximal muscle strength weaker-distal muscle strength normal except for weakness in hands and wrists 4/5 bilaterally  Psychiatric: Normal judgment and insight. Alert and oriented x 3. Normal mood.    Labs on Admission: I have personally reviewed following labs and imaging studies  CBC:  Recent Labs Lab 06/10/16 2203 06/11/16 0543 06/15/16 0613  WBC 12.2* 14.2* 12.4*  NEUTROABS  --  10.8* 9.6*  HGB 12.0* 11.9*  12.4*  HCT 37.5* 36.9* 38.5*  MCV 88.9 89.6 88.9  PLT 282 248 227   Basic Metabolic Panel:  Recent Labs Lab 06/10/16 0217 06/10/16 2203 06/11/16 0543 06/15/16 0613  NA 130*  --  133* 133*  K 4.0  --  3.8 3.7  CL 95*  --  95* 93*  CO2 27  --  30 32  GLUCOSE 103*  --  85 80  BUN 15  --  14 24*  CREATININE 0.66 0.70 0.68 0.70  CALCIUM 8.3*  --  8.4* 8.3*   GFR: Estimated Creatinine Clearance: 89.7 mL/min (by C-G formula based on SCr of 0.7 mg/dL). Liver Function Tests:  Recent Labs Lab 06/10/16 0217 06/11/16 0543  AST 88* 86*  ALT 91* 93*  ALKPHOS 45 46  BILITOT 0.4 0.4  PROT 4.8* 5.0*  ALBUMIN 2.5* 2.7*   No results for input(s): LIPASE, AMYLASE in the last 168 hours. No results for input(s): AMMONIA in the last 168 hours. Coagulation Profile: No results for input(s): INR, PROTIME in the last 168 hours. Cardiac Enzymes:  Recent Labs Lab 06/12/16 0804 06/13/16 0651 06/14/16 0538 06/15/16 0613 06/16/16 0541  CKTOTAL 1,976* 1,592* 1,767* 1,809* 2,017*   BNP (last 3 results) No results for input(s): PROBNP in the last 8760 hours. HbA1C: No results for input(s): HGBA1C in the last 72 hours. CBG: No results for input(s): GLUCAP in the last 168 hours. Lipid Profile: No results for input(s): CHOL, HDL, LDLCALC, TRIG, CHOLHDL, LDLDIRECT in the last 72 hours. Thyroid Function Tests: No results for input(s): TSH, T4TOTAL, FREET4, T3FREE, THYROIDAB in the last 72 hours. Anemia Panel: No results for input(s): VITAMINB12, FOLATE, FERRITIN, TIBC, IRON, RETICCTPCT in the last 72 hours. Urine analysis:    Component Value Date/Time   COLORURINE YELLOW 05/31/2016 1345   APPEARANCEUR CLEAR 05/31/2016 1345   LABSPEC 1.013 05/31/2016 1345   PHURINE 5.0 05/31/2016 1345   GLUCOSEU NEGATIVE 05/31/2016 1345   HGBUR MODERATE (A) 05/31/2016 1345   BILIRUBINUR NEGATIVE 05/31/2016 1345   KETONESUR NEGATIVE 05/31/2016 1345   PROTEINUR NEGATIVE 05/31/2016 1345   NITRITE  NEGATIVE 05/31/2016 1345   LEUKOCYTESUR NEGATIVE 05/31/2016 1345   Sepsis Labs: @LABRCNTIP (procalcitonin:4,lacticidven:4) )No results found for this or any previous visit (from the past 240 hour(s)).   Radiological Exams on Admission: No results found.    Assessment/Plan Principal Problem:   Polymyositis/Elevated CK -Transitioned to prednisone on 3/7 due to lack of IV access -Subsequently CK has begun to trend back upward -Hold prednisone for now and resume Solu-Medrol 60 mg IV -Continue to follow CK -Electrolyte panel from 3/12 demonstrates BUN 24 (was 14 on 3/8) which could be reflective of over diuresis therefore will check orthostatic vital signs -Repeat BMET in a.m. -May need to adjust Lasix dosing (see below) -Will need eventual muscle biopsy to  confirm diagnosis; unable to perform acutely with current elevated CK levels due to high risk of muscle necrosis -The following is prednisone taper recommended by rheumatologist:    Prednisone 60 mg PO QD for 2 weeks followed by    Prednisone 50 mf PO QD for 2 weeks    Prednisone 40 mg PO QD for 2 weeks until sees rheumatologist  -Has an appointment with rheumatologist Connor Vargas on march 30th, 2018 at [facility] [address] (information in AVS)  Active Problems:   Chronic combined systolic and diastolic congestive heart failure  -Appears clinically compensated -Unknown expected dry weight; patient has concomitant significant muscle wasting although prior to onset of current symptoms patient's weight was about 85 kg per his report -Has diuresed at least 15 L since original admission on 2/25 -Currently on Lasix 80 mg PO BID-consideration should be given to idea that patient now euvolemic with normal renal function and therefore may tolerate lower dose of Lasix; suspected overdiuresis could be contributing to up trend in CK -During acute portion of stay his losartan and spironolactone were discontinued because of hypotension -Plan is  for R/L heart cath when medically stable after dc -Etiology for HF suspected to be related to either hypothyroidism or undetected ischemia versus both -Last PCXR on 3/2 demonstrated less pleural load on the left but persistent basilar atelectasis and mild edema-we'll repeat chest x-ray in the event this may help guide decisions regarding diuretics      Acquired hypothyroidism -TSH was 15.602 on 2/25 -Repeat in 6 weeks -Continue Synthroid    Slow transit constipation/Hemorrhoids -Rectal pain is limiting patient's ability to mobilize -Continue Colace 200 twice a day -Add MiraLAX at bedtime -Continue Anusol suppositories -Continue prn sorbitol -If considered to be over diuresed this could be contributing to patient's constipation as well-likely at baseline was related to severe hypothyroidism and prior myxedema state -Sitz baths prn -has leukocytosis w/o fever- if persists in setting of rectal pain and known hemorrhoids may need to examine rectal area and consider CT pelvis to r/o perirectal abscess    Hypoalbuminemia due to protein-calorie malnutrition -Nutrition following -Continue supplements    Physical deconditioning -Management per CIR team -Continue PT/OT      DVT prophylaxis: Lovenox  Code Status: Full  Family Communication: Wife  Disposition Plan: Home      Landree Fernholz L. ANP-BC [facility] Pager [phone]   If 7PM-7AM, please contact night-coverage [email or web address] Password [facility]  06/16/2016, 10:04 AM

## 2016-06-17 ENCOUNTER — Inpatient Hospital Stay (HOSPITAL_COMMUNITY): Payer: BLUE CROSS/BLUE SHIELD | Admitting: Physical Therapy

## 2016-06-17 ENCOUNTER — Inpatient Hospital Stay (HOSPITAL_COMMUNITY): Payer: BLUE CROSS/BLUE SHIELD

## 2016-06-17 ENCOUNTER — Inpatient Hospital Stay (HOSPITAL_COMMUNITY): Payer: BLUE CROSS/BLUE SHIELD | Admitting: Occupational Therapy

## 2016-06-17 DIAGNOSIS — D72829 Elevated white blood cell count, unspecified: Secondary | ICD-10-CM

## 2016-06-17 DIAGNOSIS — R5381 Other malaise: Secondary | ICD-10-CM

## 2016-06-17 DIAGNOSIS — E039 Hypothyroidism, unspecified: Secondary | ICD-10-CM

## 2016-06-17 DIAGNOSIS — I5042 Chronic combined systolic (congestive) and diastolic (congestive) heart failure: Secondary | ICD-10-CM

## 2016-06-17 LAB — CBC WITH DIFFERENTIAL/PLATELET
BASOS PCT: 0 %
Basophils Absolute: 0 10*3/uL (ref 0.0–0.1)
EOS ABS: 0 10*3/uL (ref 0.0–0.7)
Eosinophils Relative: 0 %
HCT: 36.2 % — ABNORMAL LOW (ref 39.0–52.0)
HEMOGLOBIN: 11.9 g/dL — AB (ref 13.0–17.0)
LYMPHS ABS: 1.7 10*3/uL (ref 0.7–4.0)
Lymphocytes Relative: 12 %
MCH: 29.5 pg (ref 26.0–34.0)
MCHC: 32.9 g/dL (ref 30.0–36.0)
MCV: 89.8 fL (ref 78.0–100.0)
Monocytes Absolute: 1.1 10*3/uL — ABNORMAL HIGH (ref 0.1–1.0)
Monocytes Relative: 8 %
NEUTROS ABS: 11.5 10*3/uL — AB (ref 1.7–7.7)
Neutrophils Relative %: 80 %
Platelets: 231 10*3/uL (ref 150–400)
RBC: 4.03 MIL/uL — ABNORMAL LOW (ref 4.22–5.81)
RDW: 15.3 % (ref 11.5–15.5)
WBC: 14.3 10*3/uL — ABNORMAL HIGH (ref 4.0–10.5)

## 2016-06-17 LAB — CK: CK TOTAL: 1539 U/L — AB (ref 49–397)

## 2016-06-17 LAB — BASIC METABOLIC PANEL
ANION GAP: 10 (ref 5–15)
BUN: 25 mg/dL — AB (ref 6–20)
CO2: 30 mmol/L (ref 22–32)
Calcium: 8.6 mg/dL — ABNORMAL LOW (ref 8.9–10.3)
Chloride: 95 mmol/L — ABNORMAL LOW (ref 101–111)
Creatinine, Ser: 0.72 mg/dL (ref 0.61–1.24)
GFR calc Af Amer: >60 mL/min (ref >60)
GFR calc non Af Amer: >60 mL/min (ref >60)
GLUCOSE: 93 mg/dL (ref 65–99)
POTASSIUM: 3.5 mmol/L (ref 3.5–5.1)
Sodium: 135 mmol/L (ref 135–145)

## 2016-06-17 MED ORDER — FUROSEMIDE 40 MG PO TABS
40.0000 mg | ORAL_TABLET | Freq: Two times a day (BID) | ORAL | Status: DC
  Administered 2016-06-18 – 2016-06-20 (×5): 40 mg via ORAL
  Filled 2016-06-17 (×5): qty 1

## 2016-06-17 NOTE — Progress Notes (Signed)
Called to room for patient complaints of left arm pain radiating into ribs.  Patient states it is not chest pain but more muscular.  He explained that it started shortly after Dr. Izola PriceMyers left.  He was sitting on his bed, leaning to his left side, propped up on his left elbow when the pain started abruptly.  Upon assessment, patient states pain is better now, more like a soreness than a severe pain.  He is concerned that the pain is due to the "pee medicine" (meaning lasix) because similar symptoms have happened before after getting lasix.  VSS.  BP slightly more elevated than normal and HR elevated in low 100's.  Notified Deatra Inaan Angiulli, PA of patients concerns who says his care will be discussed during team conference with Dr. Allena KatzPatel today.  Notified Carmie EndAngie Joyce, Charge nurse of symptoms since she cared for patient yesterday and will be present during team conference.  Will continue to monitor.  Dani Gobbleeardon, Zianne Schubring J, RN

## 2016-06-17 NOTE — Progress Notes (Signed)
Physical Therapy Session Note  Patient Details  Name: Jaquise Faux MRN: [id number] Date of Birth: 1961/03/18  Today's Date: 06/17/2016 PT Individual Time: 0900-0930 1500-1530 PT Individual Time Calculation (min): 30 min and 30 min  Short Term Goals: Week 1:  PT Short Term Goal 1 (Week 1): = LTGs due to anticipated LOS  Skilled Therapeutic Interventions/Progress Updates: Tx 1: Pt presented sitting at EOB stating feeling more fatigued this am due to x-ray and previous therapy however agreeable to therapy. Pt ambulated to rehab gym with RW but no rest breaks required. NuStep L3 x 7 min LE only for endurance and LE strengthening. Pt unable to tolerate UE activity due to pain.  Pt took several rest breaks while on NuStep for UE pain and stating feelng dizzy at times. BP 127/78, HR 89, SpO2 100%. Session ended early due to pain limiting pt's level of participation in therapy and pt agreeable. Pt left in w/c in room with wife and sister present and nsg notified.   Tx2: Pt presented at EOB agreeable to therapy. Pt stating feeling more comfortable this afternoon and also understands that needs to monitor how much activity he is doing. Pt ambulated to rehab gym with RW for energy conservation. Performed LE therex with breaks, LAQ, standing SLR 2x10 with 2# wts, standing hip flexion with no additional wt.  Pt indicated urgency during session, ambulated to BR (+void) with wife present to assist. Pt returned to room with RW and returned to bed with family present and all needs met.       Therapy Documentation Precautions:  Precautions Precautions: Fall Restrictions Weight Bearing Restrictions: No General: PT Amount of Missed Time (min): 30 Minutes PT Missed Treatment Reason: Patient fatigue Vital Signs:    See Function Navigator for Current Functional Status.   Therapy/Group: Individual Therapy  Xoe Hoe  Amir Fick, PTA  06/17/2016, 9:39 AM

## 2016-06-17 NOTE — Progress Notes (Signed)
[facility] PHYSICAL MEDICINE & REHABILITATION     PROGRESS NOTE  Subjective/Complaints:  Pt seen working with OT this AM.  He slept well overnight until he was woken up for Xray. He thinks he is improving, but requests more time.   ROS: Denies CP, SOB, N/V/D.  Objective: Vital Signs: Blood pressure (!) 102/59, pulse 61, temperature 98.4 F (36.9 C), temperature source Oral, resp. rate 18, height 6\' 1"  (1.854 m), weight 72.7 kg (160 lb 4.4 oz), SpO2 99 %. Dg Chest 2 View  Result Date: 06/17/2016 CLINICAL DATA:  Weakness, CHF, increased symptoms over the past 2 months with some clinical improvement recently. EXAM: CHEST  2 VIEW COMPARISON:  Portable chest x-ray of June 05, 2016 FINDINGS: The lungs are adequately inflated. The interstitial markings remain increased. There are patchy areas of airspace opacity at the lung bases. The cardiac silhouette is enlarged. The pulmonary vascularity is not engorged. There is calcification in the wall of the aortic arch. A trace of pleural fluid blunts the posterior costophrenic angles. The bony structures exhibit no acute abnormalities. 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 No discrete alveolar pneumonia. The significant pleural effusions seen on February 2018 CT scans are not evident today. Only a small amount of pleural fluid blunts the posterior costophrenic angles. Mild cardiomegaly without significant pulmonary vascular congestion. Thoracic aortic atherosclerosis. Electronically Signed   By: David  [place] M.D.   On: 06/17/2016 07:50    Recent Labs  06/15/16 0613 06/17/16 0431  WBC 12.4* 14.3*  HGB 12.4* 11.9*  HCT 38.5* 36.2*  PLT 227 231    Recent Labs  06/15/16 0613 06/17/16 0431  NA 133* 135  K 3.7 3.5  CL 93* 95*  GLUCOSE 80 93  BUN 24* 25*  CREATININE 0.70 0.72  CALCIUM 8.3* 8.6*   CBG (last 3)  No results for input(s): GLUCAP in the last 72 hours.  Wt Readings from Last 3  Encounters:  06/17/16 72.7 kg (160 lb 4.4 oz)  06/10/16 80.3 kg (177 lb)  04/13/15 79.2 kg (174 lb 9.7 oz)    Physical Exam:  BP (!) 102/59 (BP Location: Right Arm)   Pulse 61   Temp 98.4 F (36.9 C) (Oral)   Resp 18   Ht 6\' 1"  (1.854 m)   Wt 72.7 kg (160 lb 4.4 oz)   SpO2 99%   BMI 21.15 kg/m  Constitutional: He appears well-developed and well-nourished.  HENT: Normocephalic and atraumatic.  Eyes: EOMI. No discharge.  Cardiovascular: RRR. No JVD.   Respiratory: Effort normal. Clear GI: Soft. Bowel sounds are normal.  Musculoskeletal: He exhibits no edema and no tenderness.  Neurological: He is alert and oriented.  Motor: B/l UE shoulder abduction 4+/5, elbow flex/ext 4+/5, wrist/hand 5/5  B/l LE: 3+/5 proximally, 4+/5 distally (improving) Skin: Skin is warm and dry.  Psychiatric: He has a normal mood and affect. His behavior is normal.    Assessment/Plan: 1. 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 Physiatrist is providing close team supervision and 24 hour management of active medical problems listed below. Physiatrist and rehab team continue to assess barriers to discharge/monitor patient progress toward functional and medical goals.  Function:  Bathing Bathing position   Position: Shower  Bathing parts Body parts bathed by patient: Right arm, Left arm, Chest, Abdomen, Front perineal area, Right upper leg, Left upper leg, Buttocks, Right lower leg, Left lower  leg, Back (per pt report) Body parts bathed by helper: Buttocks, Right lower leg, Left lower leg, Back  Bathing assist Assist Level: Supervision or verbal cues      Upper Body Dressing/Undressing Upper body dressing   What is the patient wearing?: Pull over shirt/dress     Pull over shirt/dress - Perfomed by patient: Thread/unthread right sleeve, Thread/unthread left sleeve, Put head through  opening, Pull shirt over trunk          Upper body assist Assist Level: Supervision or verbal cues   Set up : To obtain clothing/put away  Lower Body Dressing/Undressing Lower body dressing   What is the patient wearing?: Underwear, Pants Underwear - Performed by patient: Thread/unthread right underwear leg, Thread/unthread left underwear leg, Pull underwear up/down Underwear - Performed by helper: Thread/unthread right underwear leg, Thread/unthread left underwear leg Pants- Performed by patient: Thread/unthread left pants leg, Thread/unthread right pants leg, Pull pants up/down   Non-skid slipper socks- Performed by patient: Don/doff right sock, Don/doff left sock Non-skid slipper socks- Performed by helper: Don/doff right sock, Don/doff left sock               TED Hose - Performed by helper: Don/doff right TED hose, Don/doff left TED hose  Lower body assist Assist for lower body dressing: Supervision or verbal cues      Toileting Toileting   Toileting steps completed by patient: Adjust clothing prior to toileting, Performs perineal hygiene, Adjust clothing after toileting Toileting steps completed by helper: Adjust clothing prior to toileting, Performs perineal hygiene, Adjust clothing after toileting (per Armeniahina Ingram, NT)    Toileting assist     Transfers Chair/bed transfer Chair/bed transfer activity did not occur: Safety/medical concerns Chair/bed transfer method: Stand pivot Chair/bed transfer assist level: Touching or steadying assistance (Pt > 75%) Chair/bed transfer assistive device: Patent attorneyWalker     Locomotion Ambulation     Max distance: 100 Assist level: Supervision or verbal cues   Wheelchair       Assist Level: Dependent (Pt equals 0%) (for transport when fatigued)  Cognition Comprehension Comprehension assist level: Understands complex 90% of the time/cues 10% of the time  Expression Expression assist level: Expresses complex 90% of the time/cues < 10%  of the time  Social Interaction Social Interaction assist level: Interacts appropriately with others - No medications needed.  Problem Solving Problem solving assist level: Solves complex problems: With extra time  Memory Memory assist level: Complete Independence: No helper    Medical Problem List and Plan: 1.  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 Plan muscle biopsy once CKs stabilized.   Cont CIR   IM consulted, pt placed back on IV steroids, CK trending back down 2.  DVT Prophylaxis/Anticoagulation: Subcutaneous Lovenox. Monitor platelet counts and any signs of bleeding. Patient is ambulatory 3. Pain Management: Oxycodone as needed 4. Mood: Support 5. Neuropsych: This patient is capable of making decisions on his own behalf. 6. Skin/Wound Care: Routine skin checks 7. Fluids/Electrolytes/Nutrition: Routine I&Os 8. 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 Monitor for any signs of fluid overload. Lasix 80 mg twice a day  Follow-up cardiology services needed. Filed Weights   06/15/16 0448 06/16/16 0428 06/17/16 0459  Weight: 78.3 kg (172 lb 9.9 oz) 63 kg (138 lb 14.2 oz) 72.7 kg (160 lb 4.4 oz)   ?Reliability, but overall improving 3/14  CXR reviewed with patient, showing improvement 9. CAD. Significant coronary calcification noted on CT imaging. Plan right and left heart  catheterization once fluid status stabilized and optimized.   Follow-up per cardiology services 10. Hypothyroidism. Synthroid 175 mcg daily 11. Hyponatremia.   Na+ 135 on 3/14  Cont to monitor 12. Constipation with Hemorrhoids. Sitz baths and proctoscopy foam cream.   Reg increased 3/8 13. Leukocytosis  Likely Steroid induced  WBCs 14.3 on 3/14  Cont to monitor 14. Hypoalbuminemia  Supplement initiated 3/8 15. ABLA  Hb 11.9 on 3/14  >35 minutes spent with >30 minutes in counseling and coordination of care answering questions related to  cardiac issues, CK, and rehab  LOS (Days) 7 A FACE TO FACE EVALUATION WAS PERFORMED  Ankit Karis Juba 06/17/2016 8:16 AM

## 2016-06-17 NOTE — Progress Notes (Signed)
PROGRESS NOTE  Connor Vargas  NWG:[id number] DOB: 1961-01-14 DOA: 06/10/2016   PCP: Kaleen Mask, MD   Brief Narrative:  56 y.o. male with recently diagnosed hypothyroidism (few months ago when TSH was reportedly > 90) and his PCP started him on synthroid and the dose increased from initially 50 mcg --> currently 175 mcg but pt continued to decline in terms of overall clinical state despite treatment. He was sent to [facility] hospital about two weeks prior to this admission and was discharged home with same diagnosis of hypothyroidism and ? Myxedema. His medications were not changed and pt was told to follow up with rheumatologist which pt is scheduled to see March 6th. Pt now comes here with progressively worsening weakness, LE and UE swelling, dyspnea exertional and at rest. 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 In addition, pt's says he has not eaten anything in the past two days.   Echo done in the hospital showed new onset sCHF ef 25%, cardiology team has been assisting with management. During this hospital stay, pt had blood work done suggestive of new diagnosis of polymyositis. Pt was however, recently diagnosed with acute hypothyroid flare and has been treated with synthroid. His TSH has trended down from > 95 down to 15 on this admission. After discussion with Dr. 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   Pt currently in Inpatient rehab and [facility] re consulted due to CK levels trending up again.   Assessment & Plan:   Generalized B/L LE Weakness - multifactorial and  secondary to hypothyroidism, rhabdomyolysis, polymyositis  - blood test for AntiJo Ab + with ANA positive, elevated aldolase, all suggestive of POLYMYOSITIS  - continue following steroid regimen  Prednisone 60 mg PO QD for 2 weeks followed by Prednisone 50 mf PO QD for 2 weeks Prednisone 40 mg PO QD for 2 weeks until sees rheumatologist  - pt will eventually need muscle biopsy to confirm the actual diagnosis in an effort to place on appropriate long term medical regimen but for now this plan has been on hold as rheumatologist recommended holding off on muscle biopsy until CK level stable due to high risk of muscle necrosis  - pt now has an appointment with rheumatologist Dr. 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 No infiltrates noted on the echo.  - cardiology plans for LHC and RHC once medically more optimized   Hyponatremia - likely from fluid overload and hypothyroidism - now WNL    Acquired hypothyroidism - TSH is trending down based on record review  - continue synthroid  - pt has seen Dr. Mechele Dawley at [facility]  Rhabdomyolysis  - suspect from his thyroid condition at this time vs polymyositis  - anti Alvino Chapel - ab > 8 (high) and with elevated aldolase and CK levels this is highly suggestive of polymyositis  -  pt has an appointment with rheumatologist as outlined above  - CK trending down - repeat CK in AM - steroids as noted above     Transaminitis - suspect this is related to the above rhabdo and hypothyroid - CMET in AM    External hemorrhoids - surgery consulted for assistance and provided recommendations - this is better now  - keep on bowel regimen     B/L Moderate Pleural effusion - related to cardiac etiology and potentially PM -  breathing overall better, no significant fluid on CXR this AM  DVT prophylaxis: Lovenox SQ Code Status: Full  Family Communication: Wife at bedside. Disposition Plan:  Inpatient rehab still in progress    Subjective: Patient reports improved strength in LE and UE.  Objective: Vitals:   06/16/16 1454 06/16/16 1507 06/16/16 1510 06/17/16 0459  BP: 95/65 119/84 126/80 (!) 102/59  Pulse: 84 97 (!) 101 61  Resp: 18   18  Temp: 98 F (36.7 C)   98.4 F (36.9 C)  TempSrc: Oral   Oral  SpO2: 99%     Weight:    72.7 kg (160 lb 4.4 oz)  Height:        Intake/Output Summary (Last 24 hours) at 06/17/16 1043 Last data filed at 06/17/16 0500  Gross per 24 hour  Intake              480 ml  Output             2650 ml  Net            -2170 ml   Filed Weights   06/15/16 0448 06/16/16 0428 06/17/16 0459  Weight: 78.3 kg (172 lb 9.9 oz) 63 kg (138 lb 14.2 oz) 72.7 kg (160 lb 4.4 oz)    Examination:  General exam: Appears calm and comfortable  Respiratory system: improved air movement bilaterally  Cardiovascular system: S1 & S2 heard, RRR. No JVD, murmurs, rubs, gallops or clicks. Gastrointestinal system: Abdomen is nondistended, soft and nontender. No organomegaly or masses felt. Normal bowel sounds heard. Central nervous system: Alert and oriented. No focal neurological deficits.  Data Reviewed:   CBC:  Recent Labs Lab 06/10/16 2203 06/11/16 0543 06/15/16 0613 06/17/16 0431  WBC 12.2* 14.2* 12.4* 14.3*  NEUTROABS  --  10.8* 9.6* 11.5*  HGB 12.0* 11.9* 12.4* 11.9*  HCT 37.5* 36.9* 38.5* 36.2*  MCV 88.9 89.6 88.9 89.8  PLT 282 248 227 231   Basic Metabolic Panel:  Recent Labs Lab 06/10/16 2203 06/11/16 0543 06/15/16 0613 06/17/16 0431  NA  --  133* 133* 135  K  --  3.8 3.7 3.5  CL  --  95* 93* 95*  CO2  --  30 32 30  GLUCOSE  --  85 80 93  BUN  --  14 24* 25*  CREATININE 0.70 0.68 0.70 0.72  CALCIUM  --  8.4* 8.3* 8.6*   Liver Function Tests:  Recent  Labs Lab 06/11/16 0543  AST 86*  ALT 93*  ALKPHOS 46  BILITOT 0.4  PROT 5.0*  ALBUMIN 2.7*   Cardiac Enzymes:  Recent Labs Lab 06/13/16 0651 06/14/16 0538 06/15/16 0613 06/16/16 0541 06/17/16 0431  CKTOTAL 1,592* 1,767* 1,809* 2,017* 1,539*    Radiology Studies: Dg Chest 2 View  Result Date: 06/17/2016 CLINICAL DATA:  Weakness, CHF, increased symptoms over the past 2 months with some clinical improvement recently. EXAM: CHEST  2 VIEW COMPARISON:  Portable chest x-ray of June 05, 2016  FINDINGS: The lungs are adequately inflated. The interstitial markings remain increased. There are patchy areas of airspace opacity at the lung bases. The cardiac silhouette is enlarged. The pulmonary vascularity is not engorged. There is calcification in the wall of the aortic arch. A trace of pleural fluid blunts the posterior costophrenic angles. The bony structures exhibit no acute abnormalities. 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 No discrete alveolar pneumonia. The significant pleural effusions seen on February 2018 CT scans are not evident today. Only a small amount of pleural fluid blunts the posterior costophrenic angles. Mild cardiomegaly without significant pulmonary vascular congestion. Thoracic aortic atherosclerosis. Electronically Signed   By: David  SwazilandJordan M.D.   On: 06/17/2016 07:50    Scheduled Meds: . aspirin EC  81 mg Oral Daily  . docusate sodium  200 mg Oral BID  . enoxaparin (LOVENOX) injection  40 mg Subcutaneous Q24H  . feeding supplement (PRO-STAT SUGAR FREE 64)  30 mL Oral BID  . furosemide  80 mg Oral BID  . hydrocortisone  25 mg Rectal BID  . levothyroxine  175 mcg Oral QAC breakfast  . methylPREDNISolone (SOLU-MEDROL) injection  60 mg Intravenous Daily  . milk and molasses  1 enema Rectal Once  . polyethylene glycol  34 g Oral BID  . senna  2 tablet Oral Daily  . witch hazel-glycerin   Topical TID   Continuous  Infusions:   LOS: 7 days   Time spent: 35 mins   Debbora PrestoMAGICK-Antanette Richwine, MD [facility] Pager [phone]  If 7PM-7AM, please contact night-coverage [email or web address] Password [email or web address] 06/17/2016, 10:43 AM

## 2016-06-17 NOTE — Progress Notes (Signed)
Occupational Therapy Session Note  Patient Details  Name: Connor FickleRadovan Vargas MRN: [id number] Date of Birth: Jan 13, 1961  Today's Date: 06/17/2016 OT Individual Time: 0703-0800 and 1100-1140 OT Individual Time Calculation (min): 57 min and 40 min  20 missed minutes in second session secondary to pt fatigue and MD arrival to provided education to pt and caregivers  Short Term Goals: Week 1:  OT Short Term Goal 1 (Week 1): STGs=LTGs secondary to short estimated LOS  Skilled Therapeutic Interventions/Progress Updates:   Session 1: Upon entering the room, pt seated on EOB and reports just arriving back to room from x ray. Pt having multiple questions regarding conference and discharge planning. OT answering pt and caregiver questions until satisfied. Pt required increased encouragement to participate this session. Pt engaged in standing at sink for 5 minutes and then 8 minutes this session for grooming tasks at sink side. Pt reports increased fatigue with tasks and requests to return to seated position on EOB. Call bell and all needed items within reach upon exiting the room.   Session 2: Upon entering the room, pt seated in recliner chair with no c/o pain but c/o fatigue. Pt agreeable to OT intervention and reports, " I don't know but I will try." Pt ambulates 150' from room to ADL apartment without use of AD and supervision for safety. Pt needing min cues for forward gaze. Pt taking seated rest break secondary to fatigue. Pt also demonstrated transfer onto bed with close supervision for sit >supine with pt needing increased time to get B LEs onto bed. Pt demonstrated transfer onto TTB with supervision as well. OT recommended pt purchase steady strips in shower in order to decrease fall risk. Pt reports increased fatigue and requests to return to room via wheelchair. MD arrives to discuss test results with pt and caregivers. Call bell and all needed items within reach upon exiting the room.   Therapy  Documentation Precautions:  Precautions Precautions: Fall Restrictions Weight Bearing Restrictions: No General:   Vital Signs:  Pain:   ADL:   Exercises:   Other Treatments:    See Function Navigator for Current Functional Status.   Therapy/Group: Individual Therapy  [name], Gaylene Moylan P 06/17/2016, 9:07 AM

## 2016-06-18 ENCOUNTER — Inpatient Hospital Stay (HOSPITAL_COMMUNITY): Payer: BLUE CROSS/BLUE SHIELD | Admitting: Physical Therapy

## 2016-06-18 ENCOUNTER — Inpatient Hospital Stay (HOSPITAL_COMMUNITY): Payer: BLUE CROSS/BLUE SHIELD | Admitting: Occupational Therapy

## 2016-06-18 DIAGNOSIS — E876 Hypokalemia: Secondary | ICD-10-CM

## 2016-06-18 LAB — COMPREHENSIVE METABOLIC PANEL
ALBUMIN: 2.9 g/dL — AB (ref 3.5–5.0)
ALK PHOS: 42 U/L (ref 38–126)
ALT: 110 U/L — ABNORMAL HIGH (ref 17–63)
ANION GAP: 7 (ref 5–15)
AST: 87 U/L — ABNORMAL HIGH (ref 15–41)
BUN: 28 mg/dL — ABNORMAL HIGH (ref 6–20)
CO2: 32 mmol/L (ref 22–32)
CREATININE: 0.74 mg/dL (ref 0.61–1.24)
Calcium: 8.3 mg/dL — ABNORMAL LOW (ref 8.9–10.3)
Chloride: 97 mmol/L — ABNORMAL LOW (ref 101–111)
GFR calc Af Amer: >60 mL/min (ref >60)
GFR calc non Af Amer: >60 mL/min (ref >60)
Glucose, Bld: 87 mg/dL (ref 65–99)
Potassium: 3.4 mmol/L — ABNORMAL LOW (ref 3.5–5.1)
SODIUM: 136 mmol/L (ref 135–145)
TOTAL PROTEIN: 5.1 g/dL — AB (ref 6.5–8.1)
Total Bilirubin: 0.9 mg/dL (ref 0.3–1.2)

## 2016-06-18 LAB — CK: Total CK: 1261 U/L — ABNORMAL HIGH (ref 49–397)

## 2016-06-18 MED ORDER — POTASSIUM CHLORIDE CRYS ER 20 MEQ PO TBCR
20.0000 meq | EXTENDED_RELEASE_TABLET | Freq: Every day | ORAL | Status: DC
  Administered 2016-06-18 – 2016-06-20 (×3): 20 meq via ORAL
  Filled 2016-06-18 (×3): qty 1

## 2016-06-18 NOTE — Progress Notes (Signed)
Physical Therapy Note  Patient Details  Name: Connor Vargas MRN: [id number] Date of Birth: May 30, 1960 Today's Date: 06/18/2016    Time: [phone] 71 minutes  1:1 Pt c/o Lt UE pain, PT provided therapeutic massage which pt states reduces pain.   Gait with RW and supervision in room and in hallways all with supervision, no LOB.  Stair training to simulate home environment with Lt railing only x 12 steps with 1 seated rest break with supervision.  [place] exercise program performed with 2# bilat with pt able to perform with min cuing. Pt given handout of otago exercises and states he is able to understand and follow program for home.  Nu step x 5 minutes level 3 with pt able to self monitor to take rest breaks as needed. Pt educated on energy conservation, expresses understanding.   Edwinna Rochette 06/18/2016, 10:46 AM

## 2016-06-18 NOTE — Progress Notes (Signed)
Occupational Therapy Session Note  Patient Details  Name: Connor FickleRadovan Mealing MRN: [id number] Date of Birth: January 02, 1961  Today's Date: 06/18/2016 OT Individual Time: [id number] and 1300-1345 OT Individual Time Calculation (min): 68 min and 45 min    Short Term Goals: Week 1:  OT Short Term Goal 1 (Week 1): STGs=LTGs secondary to short estimated LOS  Skilled Therapeutic Interventions/Progress Updates:    Session 1: Upon entering the room, pt seated on EOB with wife and sister present this session. Pt reports feeling tired from yesterdays therapies but agreeable to OT intervention. OT educated pt on energy conservation strategies for ADLs and IADL task at home. Pt actively engaged in conversation and verbalizing understanding. Education to continue. Pt engaged in standing for 10 minutes at sink to shave, brush teeth, and wash face this session at mod I level. Pt sitting on EOB to take rest break and donning UB shirt with set up A to obtain clothing and supervision for LB dressing. Pt able to cross ankle to opposite knee and don/doff B socks. Pt ambulating this session throughout room without use of RW and supervision progressing to mod I level. Pt remained seated on EOB for breakfast as OT exited the room. Call bell and all needed items within reach.   Session 2: Upon entering the room, pt seated on EOB awaiting therapist. OT provided pt with paper handout regarding energy conservation education from prior session. OT also provided pt with paper handout regarding B UE strengthening exercises with use of level 1 theraband. Pt performing exercises against gravity and then with theraband. Pt performed 2 sets of 10 chest pulls, shoulder flexion, shoulder diagonals, bicep curls, and alternating punches. Pt required min verbal cues for proper technique. Pt needing frequent rest breaks this session. Pt remained EOB with family present in room upon exiting.   Therapy Documentation Precautions:   Precautions Precautions: Fall Restrictions Weight Bearing Restrictions: No See Function Navigator for Current Functional Status.   Therapy/Group: Individual Therapy  [name], Wes Lezotte P 06/18/2016, 8:39 AM

## 2016-06-18 NOTE — Progress Notes (Signed)
[place] PHYSICAL MEDICINE & REHABILITATION     PROGRESS NOTE  Subjective/Complaints:  Pt seen sitting up in bed working with therapies this AM.  He slept well overnight.  He would like to know if he can be d/ced on Friday due to convenience.   ROS: Denies CP, SOB, N/V/D.  Objective: Vital Signs: Blood pressure 103/61, pulse 61, temperature 97.9 F (36.6 C), temperature source Oral, resp. rate 16, height 6\' 1"  (1.854 m), weight 72.7 kg (160 lb 4.4 oz), SpO2 97 %. Dg Chest 2 View  Result Date: 06/17/2016 CLINICAL DATA:  Weakness, CHF, increased symptoms over the past 2 months with some clinical improvement recently. EXAM: CHEST  2 VIEW COMPARISON:  Portable chest x-ray of June 05, 2016 FINDINGS: The lungs are adequately inflated. The interstitial markings remain increased. There are patchy areas of airspace opacity at the lung bases. The cardiac silhouette is enlarged. The pulmonary vascularity is not engorged. There is calcification in the wall of the aortic arch. A trace of pleural fluid blunts the posterior costophrenic angles. The bony structures exhibit no acute abnormalities. 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 No discrete alveolar pneumonia. The significant pleural effusions seen on February 2018 CT scans are not evident today. Only a small amount of pleural fluid blunts the posterior costophrenic angles. Mild cardiomegaly without significant pulmonary vascular congestion. Thoracic aortic atherosclerosis. Electronically Signed   By: David  [place] M.D.   On: 06/17/2016 07:50    Recent Labs  06/17/16 0431  WBC 14.3*  HGB 11.9*  HCT 36.2*  PLT 231    Recent Labs  06/17/16 0431 06/18/16 0513  NA 135 136  K 3.5 3.4*  CL 95* 97*  GLUCOSE 93 87  BUN 25* 28*  CREATININE 0.72 0.74  CALCIUM 8.6* 8.3*   CBG (last 3)  No results for input(s): GLUCAP in the last 72 hours.  Wt Readings from Last 3 Encounters:  06/17/16 72.7 kg  (160 lb 4.4 oz)  06/10/16 80.3 kg (177 lb)  04/13/15 79.2 kg (174 lb 9.7 oz)    Physical Exam:  BP 103/61 (BP Location: Right Arm)   Pulse 61   Temp 97.9 F (36.6 C) (Oral)   Resp 16   Ht 6\' 1"  (1.854 m)   Wt 72.7 kg (160 lb 4.4 oz)   SpO2 97%   BMI 21.15 kg/m  Constitutional: He appears well-developed and well-nourished.  HENT: Normocephalic and atraumatic.  Eyes: EOMI. No discharge.  Cardiovascular: RRR. No JVD.   Respiratory: Effort normal. Clear GI: Soft. Bowel sounds are normal.  Musculoskeletal: He exhibits no edema and no tenderness.  Neurological: He is alert and oriented.  Motor: B/l UE shoulder abduction 4+/5, elbow flex/ext 4+/5, wrist/hand 5/5  B/l LE: 4-/5 proximally, 4+/5 distally  Skin: Skin is warm and dry. Intact.  Psychiatric: He has a normal mood and affect. His behavior is normal.    Assessment/Plan: 1. 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 Physiatrist is providing close team supervision and 24 hour management of active medical problems listed below. Physiatrist and rehab team continue to assess barriers to discharge/monitor patient progress toward functional and medical goals.  Function:  Bathing Bathing position   Position: Shower  Bathing parts Body parts bathed by patient: Right arm, Left arm, Chest, Abdomen, Front perineal area, Right upper leg, Left upper leg, Buttocks, Right lower leg, Left lower leg, Back (per  pt report) Body parts bathed by helper: Buttocks, Right lower leg, Left lower leg, Back  Bathing assist Assist Level: Supervision or verbal cues      Upper Body Dressing/Undressing Upper body dressing   What is the patient wearing?: Pull over shirt/dress     Pull over shirt/dress - Perfomed by patient: Thread/unthread right sleeve, Thread/unthread left sleeve, Put head through opening, Pull shirt over trunk           Upper body assist Assist Level: No help, No cues   Set up : To obtain clothing/put away  Lower Body Dressing/Undressing Lower body dressing   What is the patient wearing?: Underwear, Pants, Non-skid slipper socks Underwear - Performed by patient: Thread/unthread right underwear leg, Thread/unthread left underwear leg, Pull underwear up/down Underwear - Performed by helper: Thread/unthread right underwear leg, Thread/unthread left underwear leg Pants- Performed by patient: Thread/unthread left pants leg, Thread/unthread right pants leg, Pull pants up/down   Non-skid slipper socks- Performed by patient: Don/doff right sock, Don/doff left sock Non-skid slipper socks- Performed by helper: Don/doff right sock, Don/doff left sock               TED Hose - Performed by helper: Don/doff right TED hose, Don/doff left TED hose  Lower body assist Assist for lower body dressing: Supervision or verbal cues      Toileting Toileting   Toileting steps completed by patient: Adjust clothing prior to toileting, Performs perineal hygiene, Adjust clothing after toileting Toileting steps completed by helper: Adjust clothing prior to toileting, Performs perineal hygiene, Adjust clothing after toileting (per [place] Ingram, NT)    Toileting assist Assist level: More than reasonable time   Transfers Chair/bed transfer Chair/bed transfer activity did not occur: Safety/medical concerns Chair/bed transfer method: Stand pivot Chair/bed transfer assist level: Supervision or verbal cues Chair/bed transfer assistive device: Patent attorney     Max distance: 150 Assist level: Supervision or verbal cues   Wheelchair       Assist Level: Dependent (Pt equals 0%) (for transport when fatigued)  Cognition Comprehension Comprehension assist level: Understands basic 90% of the time/cues < 10% of the time  Expression Expression assist level: Expresses complex 90% of the time/cues < 10% of  the time  Social Interaction Social Interaction assist level: Interacts appropriately with others - No medications needed.  Problem Solving Problem solving assist level: Solves complex problems: Recognizes & self-corrects  Memory Memory assist level: Complete Independence: No helper    Medical Problem List and Plan: 1.  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 Plan muscle biopsy once CKs stabilized.   Cont CIR   IM consulted, transition back to PO steroids, however, still on IV, will speak with IM  CK trending back down 3/15 2.  DVT Prophylaxis/Anticoagulation: Subcutaneous Lovenox. Monitor platelet counts and any signs of bleeding. Patient is ambulatory 3. Pain Management: Oxycodone as needed 4. Mood: Support 5. Neuropsych: This patient is capable of making decisions on his own behalf. 6. Skin/Wound Care: Routine skin checks 7. Fluids/Electrolytes/Nutrition: Routine I&Os 8. 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 Monitor for any signs of fluid overload. Lasix 80 mg twice a day  Follow-up cardiology services needed. Filed Weights   06/15/16 0448 06/16/16 0428 06/17/16 0459  Weight: 78.3 kg (172 lb 9.9 oz) 63 kg (138 lb 14.2 oz) 72.7 kg (160 lb 4.4 oz)   ?Reliability, but overall improving 3/14  CXR reviewed with patient, showing improvement on 3/14 9.  CAD. Significant coronary calcification noted on CT imaging. Plan right and left heart catheterization once fluid status stabilized and optimized.   Follow-up per cardiology services 10. Hypothyroidism. Synthroid 175 mcg daily 11. Hyponatremia.   Na+ 136 on 3/15  Cont to monitor 12. Constipation with Hemorrhoids. Sitz baths and proctoscopy foam cream.   Reg increased 3/8 13. Leukocytosis  Likely Steroid induced  WBCs 14.3 on 3/14  Cont to monitor 14. Hypoalbuminemia  Supplement initiated 3/8 15. ABLA  Hb 11.9 on 3/14 16. Hypokalemia  K+ 3.4 on  3/15  Supplement initiated on    LOS (Days) 8 A FACE TO FACE EVALUATION WAS PERFORMED  Unique Sillas Karis Jubanil Kyran Connaughton 06/18/2016 11:21 AM

## 2016-06-18 NOTE — Patient Care Conference (Signed)
Inpatient RehabilitationTeam Conference and Plan of Care Update Date: 06/17/2016   Time: 2:35  PM    Patient Name: Connor Vargas      Medical Record Number: [id number]  Date of Birth: [birth year] Sex: Male         Room/Bed: 4W02C/4W02C-01 Payor Info: Payor: BLUE CROSS BLUE SHIELD / Plan: BCBS OTHER / Product Type: *No Product type* /    Admitting Diagnosis: Debility  Admit Date/Time:  06/10/2016  9:37 PM Admission Comments: No comment available   Primary Diagnosis:  Polymyositis (HCC) Principal Problem: Polymyositis (HCC)  Patient Active Problem List   Diagnosis Date Noted  . Hypokalemia   . Leukocytosis   . Physical deconditioning 06/16/2016  . Elevated CK   . Acute blood loss anemia   . Leucocytosis   . Hypoalbuminemia due to protein-calorie malnutrition (HCC)   . Polymyositis (HCC)   . Acute pulmonary edema (HCC)   . Chronic combined systolic and diastolic congestive heart failure (HCC)   . Pain   . Slow transit constipation   . Hemorrhoids   . Pleural effusion   . S/P thoracentesis   . Hyponatremia   . Hypoalbuminemia   . Elevated troponin   . Myxedema   . Cardiomyopathy (HCC)   . Coronary artery disease involving native coronary artery of native heart without angina pectoris   . Acute systolic congestive heart failure (HCC)   . Pulmonary vascular congestion 05/31/2016  . Non-traumatic rhabdomyolysis 05/31/2016  . Transaminitis 05/31/2016  . Acquired hypothyroidism   . External hemorrhoids 04/14/2015  . Syncope 04/13/2015  . CAP (community acquired pneumonia) 04/13/2015  . Weakness 04/13/2015    Expected Discharge Date: Expected Discharge Date: 06/19/16  Team Members Present: Physician leading conference: Dr. Maryla MorrowAnkit Patel Social Worker Present: Amada JupiterLucy Omari Mcmanaway, LCSW Nurse Present: Carmie EndAngie Joyce, RN PT Present: Katherine Mantleodney Wishart, PT;Rosita Dechalus, PTA OT Present: Callie FieldingKatie Pittman, OT SLP Present: Feliberto Gottronourtney Payne, SLP PPS Coordinator present : Tora DuckMarie Noel, RN, CRRN   Current Status/Progress Goal Weekly Team Focus  Medical   Debilitation with decreased functional mobility secondary to suspect polymyositis/rhabdomyolysis. Plan muscle biopsy once CKs stabilized  Improve mobility, transfers, CK  See above   Bowel/Bladder   Continent of bowel and bladder; LBM 3/14- chronic constipation  Min assist  Assess and treat for constipation as needed; adjust bowel meds to promote regular elimination pattern   Swallow/Nutrition/ Hydration   1500 fluid restriction         ADL's   supervision overall with self care tasks and functional transfers with use of RW. Family is very involved in care.  Mod I overall, Supervision for shower transfer  B UE/LE strengthening, balance, pt/family education, d/c planning   Mobility   Gait supervision with RW, supervison with transfers and stairs. Mod I bed mobitly   Mod I bed mobilty, transfers, supervision stairs  endurance, LE strengthening, balance   Communication             Safety/Cognition/ Behavioral Observations  No unsafe behaviors noted         Pain   C/o generalized pain; oxycodone 5mg  po prn effective  < 3  Assess and treat for pain q shift and prn   Skin   external hemorrhoids- tucks pads, annusol supp, stool softeners  mod I  Assess skin q shift and prn    Rehab Goals Patient on target to meet rehab goals: Yes *See Care Plan and progress notes for long and short-term goals.  Barriers to Discharge: Mobility, transfers,  CK, acute on chronic CHF, ABLA, leukocytosis    Possible Resolutions to Barriers:  Therapies, follow labs, IM consult, Cards after CK improved    Discharge Planning/Teaching Needs:  Plan to return home with wife and adult son who can provide intermittent support.  Teaching is ongoing as wife usually at the hospital.   Team Discussion:  Medical issues being monitored very closely.  Cont b/b.  MD reports pt should not be exerting himself in therapies to the point of exhaustion.  More fatigued  today with txs.  Therapy educating pt energy conservation.  Hope to be ok for d/c Sat.  Revisions to Treatment Plan:  None   Continued Need for Acute Rehabilitation Level of Care: The patient requires daily medical management by a physician with specialized training in physical medicine and rehabilitation for the following conditions: Daily direction of a multidisciplinary physical rehabilitation program to ensure safe treatment while eliciting the highest outcome that is of practical value to the patient.: Yes Daily medical management of patient stability for increased activity during participation in an intensive rehabilitation regime.: Yes Daily analysis of laboratory values and/or radiology reports with any subsequent need for medication adjustment of medical intervention for : Cardiac problems;Other  Piper Hassebrock 06/19/2016, 2:40 PM

## 2016-06-18 NOTE — Progress Notes (Signed)
PROGRESS NOTE  Caster Knab  ZOX:[id number] DOB: [birth year] DOA: 06/10/2016   PCP: Kaleen Mask, MD   Brief Narrative:  56 y.o. male with recently diagnosed hypothyroidism (few months ago when TSH was reportedly > 90) and his PCP started him on synthroid and the dose increased from initially 50 mcg --> currently 175 mcg but pt continued to decline in terms of overall clinical state despite treatment. He was sent to [facility] hospital about two weeks prior to this admission and was discharged home with same diagnosis of hypothyroidism and ? Myxedema. His medications were not changed and pt was told to follow up with rheumatologist which pt is scheduled to see March 6th. Pt now comes here with progressively worsening weakness, LE and UE swelling, dyspnea exertional and at rest. 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 In addition, pt's says he has not eaten anything in the past two days.   Echo done in the hospital showed new onset sCHF ef 25%, cardiology team has been assisting with management. During this hospital stay, pt had blood work done suggestive of new diagnosis of polymyositis. Pt was however, recently diagnosed with acute hypothyroid flare and has been treated with synthroid. His TSH has trended down from > 95 down to 15 on this admission. After discussion with Dr. Selena Batten rheumatologist at [facility], current blood work highly suggestive of coexistent polymyositis, IV Solumedrol 60 mg QD recommended until CK level down in 1000's, after that prolonged steroid taper recommended until pt seen in follow up appointment with rheumatologist.   Pt currently in Inpatient rehab and [facility] re consulted due to CK levels trending up again.   Assessment & Plan:   Generalized B/L LE Weakness - multifactorial and  secondary to hypothyroidism, rhabdomyolysis, polymyositis  - blood test for AntiJo Ab + with ANA positive, elevated aldolase, all suggestive of POLYMYOSITIS  - continue following steroid regimen  Prednisone 60 mg PO QD for 2 weeks followed by Prednisone 50 mf PO QD for 2 weeks Prednisone 40 mg PO QD for 2 weeks until sees rheumatologist  - pt will eventually need muscle biopsy to confirm the actual diagnosis in an effort to place on appropriate long term medical regimen but for now this plan has been on hold as rheumatologist recommended holding off on muscle biopsy until CK level stable due to high risk of muscle necrosis  - pt now has an appointment with rheumatologist Dr. Amedeo Kinsman on march 30th, 2018 at [facility] [address], will place information in AVS  - I would continue IV solumedrol until CK level <1000 as was recommended by rheumatologist  - once CK < 1000, resume oral prednisone taper   New onset Decompensated Systolic CHF ef 25% - unknown etiology at this time  - unclear if related to hypothyroidism  - echo done- shows ef 25%-30% with severely reduced systolic function. No infiltrates noted on the echo.  - cardiology plans for LHC and RHC once medically more optimized  - dose of Lasix cut down from 80 mg to 40 mg BID PO due to pt's dizziness and hypotension   Hyponatremia - likely from fluid overload and hypothyroidism - now WNL    Acquired hypothyroidism - TSH is trending down based on record review  - continue synthroid  -  pt has [facility]ec[facility]  Rhabdomyolysis  - suspect from his thyroid condition at this time vs polymyositis  - anti [name] - ab > 8 (high) and with elevated aldolase and CK levels this is highly suggestive of polymyositis  - pt has an appointment with rheumatologist as outlined above  - CK trending down - repeat CK in AM - steroids as noted above     Transaminitis - suspect this is related to the above rhabdo and hypothyroid - CMET in  AM    External hemorrhoids - surgery consulted for assistance and provided recommendations - this is better now  - keep on bowel regimen     B/L Moderate Pleural effusion - related to cardiac etiology and potentially PM - breathing overall better, no significant fluid on CXR this AM  DVT prophylaxis: Lovenox SQ Code Status: Full  Family Communication: Wife at bedside. Disposition Plan:  Inpatient rehab still in progress   Subjective: Patient reports improved strength in LE and UE.  Objective: Vitals:   06/17/16 0459 06/17/16 1323 06/17/16 1442 06/18/16 0438  BP: (!) 102/59 (!) 141/93 116/66 103/61  Pulse: 61 (!) 102 77 61  Resp: 18  18 16   Temp: 98.4 F (36.9 C)  98.5 F (36.9 C) 97.9 F (36.6 C)  TempSrc: Oral  Oral Oral  SpO2:  98% 98% 97%  Weight: 72.7 kg (160 lb 4.4 oz)     Height:        Intake/Output Summary (Last 24 hours) at 06/18/16 1350 Last data filed at 06/18/16 0830  Gross per 24 hour  Intake              480 ml  Output             2625 ml  Net            -2145 ml   Filed Weights   06/15/16 0448 06/16/16 0428 06/17/16 0459  Weight: 78.3 kg (172 lb 9.9 oz) 63 kg (138 lb 14.2 oz) 72.7 kg (160 lb 4.4 oz)    Examination:  General exam: Appears calm and comfortable  Respiratory system: improved air movement bilaterally  Cardiovascular system: S1 & S2 heard, RRR. No JVD, murmurs, rubs, gallops or clicks. Gastrointestinal system: Abdomen is nondistended, soft and nontender. No organomegaly or masses felt. Normal bowel sounds heard. Central nervous system: Alert and oriented. No focal neurological deficits.  Data Reviewed:   CBC:  Recent Labs Lab 06/15/16 0613 06/17/16 0431  WBC 12.4* 14.3*  NEUTROABS 9.6* 11.5*  HGB 12.4* 11.9*  HCT 38.5* 36.2*  MCV 88.9 89.8  PLT 227 231   Basic Metabolic Panel:  Recent Labs Lab 06/15/16 0613 06/17/16 0431 06/18/16 0513  NA 133* 135 136  K 3.7 3.5 3.4*  CL 93* 95* 97*  CO2 32 30 32  GLUCOSE 80  93 87  BUN 24* 25* 28*  CREATININE 0.70 0.72 0.74  CALCIUM 8.3* 8.6* 8.3*   Liver Function Tests:  Recent Labs Lab 06/18/16 0513  AST 87*  ALT 110*  ALKPHOS 42  BILITOT 0.9  PROT 5.1*  ALBUMIN 2.9*   Cardiac Enzymes:  Recent Labs Lab 06/14/16 0538 06/15/16 0613 06/16/16 0541 06/17/16 0431 06/18/16 0513  CKTOTAL 1,767* 1,809* 2,017* 1,539* 1,261*    Radiology Studies: Dg Chest 2 View  Result Date: 06/17/2016 CLINICAL DATA:  Weakness, CHF, increased symptoms over the past 2 months with some clinical improvement recently. EXAM: CHEST  2 VIEW COMPARISON:  Portable chest x-ray of June 05, 2016 FINDINGS: The lungs are adequately inflated. The interstitial markings remain increased. There are patchy areas of airspace opacity at the lung bases. The cardiac silhouette is enlarged. The pulmonary vascularity is not engorged. There is calcification in the wall of the aortic arch. A trace of pleural fluid blunts the posterior costophrenic angles. The bony structures exhibit no acute abnormalities. 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 No discrete alveolar pneumonia. The significant pleural effusions seen on February 2018 CT scans are not evident today. Only a small amount of pleural fluid blunts the posterior costophrenic angles. Mild cardiomegaly without significant pulmonary vascular congestion. Thoracic aortic atherosclerosis. Electronically Signed   By: David  [place] M.D.   On: 06/17/2016 07:50    Scheduled Meds: . aspirin EC  81 mg Oral Daily  . docusate sodium  200 mg Oral BID  . enoxaparin (LOVENOX) injection  40 mg Subcutaneous Q24H  . feeding supplement (PRO-STAT SUGAR FREE 64)  30 mL Oral BID  . furosemide  40 mg Oral BID  . hydrocortisone  25 mg Rectal BID  . levothyroxine  175 mcg Oral QAC breakfast  . methylPREDNISolone (SOLU-MEDROL) injection  60 mg Intravenous Daily  . milk and molasses  1 enema Rectal Once  .  polyethylene glycol  34 g Oral BID  . potassium chloride  20 mEq Oral Daily  . senna  2 tablet Oral Daily  . witch hazel-glycerin   Topical TID   Continuous Infusions:   LOS: 8 days   Time spent: 35 mins   Debbora Presto, MD [facility] Pager [phone]  If 7PM-7AM, please contact night-coverage [email or web address] Password [email or web address] 06/18/2016, 1:50 PM

## 2016-06-18 NOTE — Progress Notes (Signed)
Social Work Patient ID: Programmer, systems, male   DOB: [birth year], 56 y.o.   MRN: [id number]   Met with pt and wife following team conference yesterday.  They are both aware and agreeable with targeted d/c for Sat 3/17 pending medical clearance.  Reviewed recommended DME and HH follow up.  Will check with them again tomorrow and with MD to confirm ok for d/c.  Shawntel Farnworth, LCSW

## 2016-06-19 ENCOUNTER — Inpatient Hospital Stay (HOSPITAL_COMMUNITY): Payer: BLUE CROSS/BLUE SHIELD | Admitting: Physical Therapy

## 2016-06-19 ENCOUNTER — Inpatient Hospital Stay (HOSPITAL_COMMUNITY): Payer: BLUE CROSS/BLUE SHIELD | Admitting: Occupational Therapy

## 2016-06-19 LAB — COMPREHENSIVE METABOLIC PANEL
ALT: 111 U/L — ABNORMAL HIGH (ref 17–63)
ANION GAP: 11 (ref 5–15)
AST: 95 U/L — ABNORMAL HIGH (ref 15–41)
Albumin: 3.2 g/dL — ABNORMAL LOW (ref 3.5–5.0)
Alkaline Phosphatase: 48 U/L (ref 38–126)
BUN: 24 mg/dL — ABNORMAL HIGH (ref 6–20)
CALCIUM: 8.7 mg/dL — AB (ref 8.9–10.3)
CHLORIDE: 95 mmol/L — AB (ref 101–111)
CO2: 31 mmol/L (ref 22–32)
Creatinine, Ser: 0.67 mg/dL (ref 0.61–1.24)
Glucose, Bld: 84 mg/dL (ref 65–99)
Potassium: 3.7 mmol/L (ref 3.5–5.1)
SODIUM: 137 mmol/L (ref 135–145)
Total Bilirubin: 0.9 mg/dL (ref 0.3–1.2)
Total Protein: 5.5 g/dL — ABNORMAL LOW (ref 6.5–8.1)

## 2016-06-19 LAB — CK: CK TOTAL: 1692 U/L — AB (ref 49–397)

## 2016-06-19 MED ORDER — TRAZODONE HCL 50 MG PO TABS: 50.0000 mg | ORAL_TABLET | Freq: Every evening | ORAL | 1 refills | Status: DC | PRN

## 2016-06-19 MED ORDER — OXYCODONE HCL 5 MG PO TABS: 5.0000 mg | ORAL_TABLET | ORAL | 0 refills | Status: DC | PRN

## 2016-06-19 MED ORDER — PREDNISONE 20 MG PO TABS: ORAL_TABLET | ORAL | 0 refills | Status: DC

## 2016-06-19 MED ORDER — WITCH HAZEL-GLYCERIN EX PADS: MEDICATED_PAD | Freq: Three times a day (TID) | CUTANEOUS | 12 refills | Status: DC

## 2016-06-19 MED ORDER — PREDNISONE 20 MG PO TABS: 60.0000 mg | ORAL_TABLET | Freq: Every day | ORAL | 0 refills | Status: DC

## 2016-06-19 MED ORDER — PANTOPRAZOLE SODIUM 40 MG PO TBEC: 40.0000 mg | DELAYED_RELEASE_TABLET | Freq: Every day | ORAL | 1 refills | Status: DC

## 2016-06-19 MED ORDER — FUROSEMIDE 40 MG PO TABS: 40.0000 mg | ORAL_TABLET | Freq: Two times a day (BID) | ORAL | 1 refills | Status: AC

## 2016-06-19 MED ORDER — FUROSEMIDE 40 MG PO TABS: 40.0000 mg | ORAL_TABLET | Freq: Two times a day (BID) | ORAL | 1 refills | Status: DC

## 2016-06-19 MED ORDER — LEVOTHYROXINE SODIUM 175 MCG PO TABS: 175.0000 ug | ORAL_TABLET | Freq: Every day | ORAL | 1 refills | Status: AC

## 2016-06-19 MED ORDER — POTASSIUM CHLORIDE CRYS ER 20 MEQ PO TBCR: 20.0000 meq | EXTENDED_RELEASE_TABLET | Freq: Every day | ORAL | 1 refills | Status: DC

## 2016-06-19 MED ORDER — POLYETHYLENE GLYCOL 3350 17 G PO PACK
17.0000 g | PACK | Freq: Every day | ORAL | Status: DC | PRN
  Administered 2016-06-19: 17 g via ORAL
  Filled 2016-06-19: qty 1

## 2016-06-19 NOTE — Discharge Summary (Signed)
NAME:  Connor Vargas, Connor Vargas               ACCOUNT NO.:  [id number]  MEDICAL RECORD NO.:  [id number]  LOCATION:  4W02C                        FACILITY:  [facility]  PHYSICIAN:  Maryla Morrow, MD        DATE OF BIRTH:  1961/01/28  DATE OF ADMISSION:  06/10/2016 DATE OF DISCHARGE:  06/20/2016                              DISCHARGE SUMMARY   DISCHARGE DIAGNOSES: 1. Polymyositis with rhabdomyolysis. 2. Subcutaneous Lovenox for deep venous thrombosis prophylaxis. 3. Pain management. 4. 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 5. Coronary artery disease. 6. Hypothyroidism. 7. Hyponatremia, resolved. 8. Constipation. 9. Hypokalemia, resolved.  HISTORY OF PRESENT ILLNESS:  This is a 56 year old right-handed male, history of recently-diagnosed hypothyroidism with TSH a few months ago reportedly greater than 90.  History taken from chart review.  Placed on hormone supplement, titrated, but continued to decline with generalized weakness.  He was sent to [facility] and was later discharged to home, scheduled to meet with rheumatologist, June 09, 2016.  Presented on May 31, 2016, with progressive weakness of lower extremities as well as shortness of breath at baseline, was independent until few weeks ago, working full time.  Found to be hyponatremic 125, TSH 15.602, CK 5482, sedimentation rate 15, troponin 0.59.  CT of the chest without contrast showed moderately large bilateral pleural effusions.  Underwent right effusion tap, June 04, 2016, with 800 mL obtained and left effusion tap with 500 mL yield. Extensive coronary artery disease with heavy calcification, left main and left coronary artery.  Chest x-ray, reviewed showing decreased pleural effusion.  Echocardiogram with ejection fraction of 30%, old MI, placed on intravenous Lasix.  Cardiology Service is consulted, plan cardiac catheterization when the patient's fluid status improved. Remained on hormone  supplement.  In regard to bilateral lower extremity weakness with blood test of anti-Jo with ANA positive, elevated aldolase, all suggestive of polymyositis.  Rheumatology Services at [facility] contact, they advised intravenous Solu-Medrol, monitoring of CPK, slow prednisone taper, hold on muscle biopsy at this time until CK level stabilized due to high risk of muscle necrosis. Latest CK levels 1736, sodium levels improving.  Placed on subcutaneous Lovenox for DVT prophylaxis.  The patient was admitted for comprehensive rehab program.  PAST MEDICAL HISTORY:  See discharge diagnoses.  SOCIAL HISTORY:  Lives with wife independent prior to admission.  FUNCTIONAL STATUS:  Upon admission to [facility], was minimal guard 150 feet, rolling walker; moderate assist, sit to stand; min-to-mod assist, activities of daily living.  PHYSICAL EXAMINATION:  VITAL SIGNS:  Blood pressure 99/59, pulse 67, temperature 98, respirations 18. GENERAL:  Alert male, in no acute distress, oriented x3. HEENT:  EOMs intact. NECK:  Supple.  Nontender.  No JVD. CARDIAC:  Regular rate and rhythm without murmur. ABDOMEN:  Soft, nontender.  Good bowel sounds.  Motor muscle strength 4- /5.  REHABILITATION HOSPITAL COURSE:  The patient was admitted to Inpatient Rehab Services with therapies initiated on a 3-hour daily basis consisting of physical therapy, occupational therapy and rehabilitation nursing.  The following issues were addressed during the patient's rehabilitation stay.  Pertaining to Mr. Amos polymyositis, he had been on IV Solu-Medrol, transition to p.o.  steroids and close monitoring of CKs that fluctuated from [phone] with ultimate plan to follow up with [facility], Dr. Sharmon RevereZiolkowska at [phone] for ongoing medical management March 30. On day of discharge his prednisone was changed to 60 mg daily by mouth that he would continue on the time of discharge. The patient  participated fully with therapies with excellent overall progress.  Subcutaneous Lovenox for DVT prophylaxis.  No bleeding episodes.  He was using oxycodone on a limited basis for pain.  He exhibited no signs of fluid overload; however, plans were for Cardiology to follow up as an outpatient to consider possible need for future cardiac catheterization. No bouts of chest pain or shortness of breath.  He had some occasional constipation, resolved with laxative assistance.  The patient received weekly collaborative interdisciplinary team conferences to discuss estimated length of stay, family teaching, any barriers to discharge. The patient ambulating with a rolling walker supervision, working with energy conservation techniques, he could gather his belongings for activities of daily living and homemaking with dressing, grooming and homemaking.  Full family teaching was completed.  Discussed no driving. He was discharged to home.  DISCHARGE MEDICATIONS:  Included: 1. Prednisone 60 mg p.o. daily until follow-up with rheumatology 2. Aspirin 81 mg p.o. daily. 3. Colace 200 mg p.o. b.i.d. 4. Lasix 40 mg p.o. b.i.d. 5. Synthroid 175 mcg p.o. daily. 6. Potassium chloride 20 mEq p.o. daily. 7. Oxycodone 5 mg p.o. every 4 hours as needed for pain.  DIET:  Regular.  FOLLOWUP:  The patient would follow up with Dr. Maryla MorrowAnkit Patel at the Outpatient Rehab Service Office as advised; Dr. Nicki Guadalajarahomas Kelly, Cardiology Services to discuss future plans for cardiac catheterization; Dr. Sharmon RevereZiolkowska, followup, July 03, 2016, at [address], [id number]; phone number, [phone].     Mariam Dollaraniel Alexanderia Gorby, P.A.   ______________________________ Maryla MorrowAnkit Patel, MD    DA/MEDQ  D:  06/19/2016  T:  06/19/2016  Job:  [id number]  cc:   Francee GentileAldona Ziolkowska, MD Nicki Guadalajarahomas Kelly, M.D.

## 2016-06-19 NOTE — Progress Notes (Signed)
AVS completed for tomorrow's discharge, scripts provided. Will sign and give to patient. Thank you for all your help.   Debbora PrestoMAGICK-Gwenevere Goga, MD  [facility] Pager [phone]  If 7PM-7AM, please contact night-coverage [email or web address] Password [email or web address]

## 2016-06-19 NOTE — Progress Notes (Signed)
PROGRESS NOTE  Connor Vargas  ZOX:[id number] DOB: 1960-08-31 DOA: 06/10/2016   PCP: Kaleen MaskELKINS,WILSON OLIVER, MD   Brief Narrative:  56 y.o. male with recently diagnosed hypothyroidism (few months ago when TSH was reportedly > 90) and his PCP started him on synthroid and the dose increased from initially 50 mcg --> currently 175 mcg but pt continued to decline in terms of overall clinical state despite treatment. He was [facility] about two weeks prior to this admission and was discharged home with same diagnosis of hypothyroidism and ? Myxedema. His medications were not changed and pt was told to follow up with rheumatologist which pt is scheduled to see March 6th. Pt now comes here with progressively worsening weakness, LE and UE swelling, dyspnea exertional and at rest. 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 In addition, pt's says he has not eaten anything in the past two days.   Echo done in the hospital showed new onset sCHF ef 25%, cardiology team has been assisting with management. During this hospital stay, pt had blood work done suggestive of new diagnosis of polymyositis. Pt was however, recently diagnosed with acute hypothyroid flare and has been treated with synthroid. His TSH has trended down from > 95 down to 15 on this admission. After discussion with Dr. 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   Pt currently in Inpatient rehab and [facility] re consulted due to CK levels trending up again.   Assessment & Plan:   Generalized B/L LE Weakness - multifactorial and  secondary to hypothyroidism, rhabdomyolysis, polymyositis  - blood test for AntiJo Ab + with ANA positive, elevated aldolase, all suggestive of POLYMYOSITIS  - continue following steroid regimen  Prednisone 60 mg PO QD for 2 weeks followed by Prednisone 50 mf PO QD for 2 weeks Prednisone 40 mg PO QD for 2 weeks until sees rheumatologist  - pt will eventually need muscle biopsy to confirm the actual diagnosis in an effort to place on appropriate long term medical regimen but for now this plan has been on hold as rheumatologist recommended holding off on muscle biopsy until CK level stable due to high risk of muscle necrosis  - pt now has an appointment with rheumatologist Dr. 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 No infiltrates noted on the echo.  - cardiology plans for LHC and RHC once medically more optimized  - dose of Lasix cut down from 80 mg to 40 mg BID PO due to pt's dizziness and hypotension  - I will provide script for pt  Hyponatremia - likely from fluid overload and hypothyroidism - now WNL    Acquired hypothyroidism - TSH is trending down based on record review  - continue synthroid  -  pt has seen Dr. Mechele Dawley at [facility]  Rhabdomyolysis  - suspect from his thyroid condition at this time vs polymyositis  - anti Alvino Chapel - ab > 8 (high) and with elevated aldolase and CK levels this is highly suggestive of polymyositis  - pt has an appointment with rheumatologist as outlined above  - CK trending down - repeat CK in AM - steroids as noted above     Transaminitis - suspect this is related to the above rhabdo and hypothyroid - CMET in  AM    External hemorrhoids - surgery consulted for assistance and provided recommendations - this is better now  - keep on bowel regimen  - ok to use Miralax    B/L Moderate Pleural effusion - related to cardiac etiology and potentially PM - breathing overall better, no significant fluid on CXR this AM  DVT prophylaxis: Lovenox SQ Code Status: Full  Family Communication: Wife at bedside. Disposition Plan:  Inpatient rehab still in progress   Subjective: Patient reports improved strength in LE and UE.  Objective: Vitals:   06/18/16 0438 06/18/16 1452 06/18/16 2000 06/19/16 0546  BP: 103/61 105/67  103/66  Pulse: 61 72  70  Resp: 16 17  16   Temp: 97.9 F (36.6 C) 98.2 F (36.8 C)  98.3 F (36.8 C)  TempSrc: Oral Oral  Oral  SpO2: 97% 98% 97% 96%  Weight:    73 kg (160 lb 15 oz)  Height:       No intake or output data in the 24 hours ending 06/19/16 1337 Filed Weights   06/16/16 0428 06/17/16 0459 06/19/16 0546  Weight: 63 kg (138 lb 14.2 oz) 72.7 kg (160 lb 4.4 oz) 73 kg (160 lb 15 oz)    Examination:  General exam: Appears calm and comfortable  Respiratory system: improved air movement bilaterally  Cardiovascular system: S1 & S2 heard, RRR. No JVD, murmurs, rubs, gallops or clicks. Gastrointestinal system: Abdomen is nondistended, soft and nontender. No organomegaly or masses felt. Normal bowel sounds heard. Central nervous system: Alert and oriented. No focal neurological deficits.  Data Reviewed:   CBC:  Recent Labs Lab 06/15/16 0613 06/17/16 0431  WBC 12.4* 14.3*  NEUTROABS 9.6* 11.5*  HGB 12.4* 11.9*  HCT 38.5* 36.2*  MCV 88.9 89.8  PLT 227 231   Basic Metabolic Panel:  Recent Labs Lab 06/15/16 0613 06/17/16 0431 06/18/16 0513 06/19/16 0535  NA 133* 135 136 137  K 3.7 3.5 3.4* 3.7  CL 93* 95* 97* 95*  CO2 32 30 32 31  GLUCOSE 80 93 87 84  BUN 24* 25* 28* 24*  CREATININE 0.70 0.72 0.74 0.67  CALCIUM 8.3* 8.6* 8.3* 8.7*   Liver  Function Tests:  Recent Labs Lab 06/18/16 0513 06/19/16 0535  AST 87* 95*  ALT 110* 111*  ALKPHOS 42 48  BILITOT 0.9 0.9  PROT 5.1* 5.5*  ALBUMIN 2.9* 3.2*   Cardiac Enzymes:  Recent Labs Lab 06/15/16 1610 06/16/16 0541 06/17/16 0431 06/18/16 0513 06/19/16 0535  CKTOTAL 1,809* 2,017* 1,539* 1,261* 1,692*    Radiology Studies: No results found.  Scheduled Meds: . aspirin EC  81 mg Oral Daily  . docusate sodium  200 mg Oral BID  . enoxaparin (LOVENOX) injection  40 mg Subcutaneous Q24H  . feeding supplement (PRO-STAT SUGAR FREE 64)  30 mL Oral BID  . furosemide  40 mg Oral BID  . hydrocortisone  25 mg Rectal BID  . levothyroxine  175 mcg Oral  QAC breakfast  . methylPREDNISolone (SOLU-MEDROL) injection  60 mg Intravenous Daily  . milk and molasses  1 enema Rectal Once  . potassium chloride  20 mEq Oral Daily  . senna  2 tablet Oral Daily  . witch hazel-glycerin   Topical TID   Continuous Infusions:   LOS: 9 days   Time spent: 35 mins   Debbora Presto, MD [facility] Pager [phone]  If 7PM-7AM, please contact night-coverage [email or web address] Password [email or web address] 06/19/2016, 1:37 PM

## 2016-06-19 NOTE — Progress Notes (Signed)
[facility] PHYSICAL MEDICINE & REHABILITATION     PROGRESS NOTE  Subjective/Complaints:  Pt seen sitting up in bed this AM.  He slept fair overnight.  He complains of constipation this AM, but has been refusing his bowel meds.    ROS: +Constipation. Denies CP, SOB, N/V/D.  Objective: Vital Signs: Blood pressure 103/66, pulse 70, temperature 98.3 F (36.8 C), temperature source Oral, resp. rate 16, height 6\' 1"  (1.854 m), weight 73 kg (160 lb 15 oz), SpO2 96 %. No results found.  Recent Labs  06/17/16 0431  WBC 14.3*  HGB 11.9*  HCT 36.2*  PLT 231    Recent Labs  06/18/16 0513 06/19/16 0535  NA 136 137  K 3.4* 3.7  CL 97* 95*  GLUCOSE 87 84  BUN 28* 24*  CREATININE 0.74 0.67  CALCIUM 8.3* 8.7*   CBG (last 3)  No results for input(s): GLUCAP in the last 72 hours.  Wt Readings from Last 3 Encounters:  06/19/16 73 kg (160 lb 15 oz)  06/10/16 80.3 kg (177 lb)  04/13/15 79.2 kg (174 lb 9.7 oz)    Physical Exam:  BP 103/66 (BP Location: Right Arm)   Pulse 70   Temp 98.3 F (36.8 C) (Oral)   Resp 16   Ht 6\' 1"  (1.854 m)   Wt 73 kg (160 lb 15 oz)   SpO2 96%   BMI 21.23 kg/m  Constitutional: He appears well-developed and well-nourished.  HENT: Normocephalic and atraumatic.  Eyes: EOMI. No discharge.  Cardiovascular: RRR. No JVD.   Respiratory: Effort normal. Clear GI: Soft. Bowel sounds are normal.  Musculoskeletal: He exhibits no edema and no tenderness.  Neurological: He is alert and oriented.  Motor: B/l UE shoulder abduction 4+/5, elbow flex/ext 4+/5, wrist/hand 5/5  B/l LE: 4-/5 proximally, 4+/5 distally (stable) Skin: Skin is warm and dry. Intact.  Psychiatric: He has a normal mood and affect. His behavior is normal.    Assessment/Plan: 1. 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 Physiatrist is providing close team supervision and 24  hour management of active medical problems listed below. Physiatrist and rehab team continue to assess barriers to discharge/monitor patient progress toward functional and medical goals.  Function:  Bathing Bathing position   Position: Shower  Bathing parts Body parts bathed by patient: Right arm, Left arm, Chest, Abdomen, Front perineal area, Right upper leg, Left upper leg, Buttocks, Right lower leg, Left lower leg, Back (per pt report) Body parts bathed by helper: Buttocks, Right lower leg, Left lower leg, Back  Bathing assist Assist Level: Supervision or verbal cues      Upper Body Dressing/Undressing Upper body dressing   What is the patient wearing?: Pull over shirt/dress     Pull over shirt/dress - Perfomed by patient: Thread/unthread right sleeve, Thread/unthread left sleeve, Put head through opening, Pull shirt over trunk          Upper body assist Assist Level: No help, No cues   Set up : To obtain clothing/put away  Lower Body Dressing/Undressing Lower body dressing   What is the patient wearing?: Underwear, Pants, Non-skid slipper socks Underwear - Performed by patient: Thread/unthread right underwear leg, Thread/unthread left underwear leg, Pull underwear up/down Underwear - Performed by helper: Thread/unthread right underwear leg, Thread/unthread left underwear leg Pants- Performed by patient: Thread/unthread left pants leg, Thread/unthread right pants leg, Pull pants up/down   Non-skid slipper socks-  Performed by patient: Don/doff right sock, Don/doff left sock Non-skid slipper socks- Performed by helper: Don/doff right sock, Don/doff left sock               TED Hose - Performed by helper: Don/doff right TED hose, Don/doff left TED hose  Lower body assist Assist for lower body dressing: Supervision or verbal cues      Toileting Toileting   Toileting steps completed by patient: Adjust clothing prior to toileting, Performs perineal hygiene, Adjust clothing  after toileting Toileting steps completed by helper: Adjust clothing prior to toileting, Performs perineal hygiene, Adjust clothing after toileting    Toileting assist Assist level: More than reasonable time, Touching or steadying assistance (Pt.75%) (Family very involvws)   Transfers Chair/bed transfer Chair/bed transfer activity did not occur: Safety/medical concerns Chair/bed transfer method: Stand pivot Chair/bed transfer assist level: No Help, no cues, assistive device, takes more than a reasonable amount of time Chair/bed transfer assistive device: Bedrails, Patent attorneyWalker     Locomotion Ambulation     Max distance: 200 Assist level: No help, No cues, assistive device, takes more than a reasonable amount of time   Wheelchair       Assist Level: Dependent (Pt equals 0%) (for transport when fatigued)  Cognition Comprehension Comprehension assist level: Understands basic 90% of the time/cues < 10% of the time  Expression Expression assist level: Expresses complex 90% of the time/cues < 10% of the time  Social Interaction Social Interaction assist level: Interacts appropriately with others - No medications needed.  Problem Solving Problem solving assist level: Solves complex problems: Recognizes & self-corrects  Memory Memory assist level: Complete Independence: No helper    Medical Problem List and Plan: 1.  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 Plan muscle biopsy once CKs stabilized.   Cont CIR, d/c ?today, ?tomorrow, IM consulted, will speak with IM again, per noted cont IV meds until CK <1000, however, trending back up.  May require PICC line as well as adjustment to steroid dose  2.  DVT Prophylaxis/Anticoagulation: Subcutaneous Lovenox. Monitor platelet counts and any signs of bleeding. Patient is ambulatory 3. Pain Management: Oxycodone as needed 4. Mood: Support 5. Neuropsych: This patient is capable of making decisions  on his own behalf. 6. Skin/Wound Care: Routine skin checks 7. Fluids/Electrolytes/Nutrition: Routine I&Os 8. 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 Monitor for any signs of fluid overload. Lasix 80 mg twice a day  Follow-up cardiology services needed. Filed Weights   06/16/16 0428 06/17/16 0459 06/19/16 0546  Weight: 63 kg (138 lb 14.2 oz) 72.7 kg (160 lb 4.4 oz) 73 kg (160 lb 15 oz)   ?Reliability, but overall improving 3/14  CXR reviewed with patient, showing improvement on 3/14 9. CAD. Significant coronary calcification noted on CT imaging. Plan right and left heart catheterization once fluid status stabilized and optimized.   Follow-up per cardiology services 10. Hypothyroidism. Synthroid 175 mcg daily 11. Hyponatremia.   Na+ 137 on 3/16  Cont to monitor 12. Constipation with Hemorrhoids. Sitz baths and proctoscopy foam cream.   Reg increased 3/8 13. Leukocytosis  Likely Steroid induced  WBCs 14.3 on 3/14  Cont to monitor 14. Hypoalbuminemia  Supplement initiated 3/8 15. ABLA  Hb 11.9 on 3/14 16. Hypokalemia  K+ 3.7 on 3/16  Supplement initiated  LOS (Days) 9 A FACE TO FACE EVALUATION WAS PERFORMED  Connor Vargas 06/19/2016 9:43 AM

## 2016-06-19 NOTE — Progress Notes (Signed)
Physical Therapy Session Note  Patient Details  Name: Connor Vargas MRN: [id number] Date of Birth: April 17, 1960  Today's Date: 06/19/2016 PT Individual Time: [id number] PT Individual Time Calculation (min): 60 min   Short Term Goals: Week 1:  PT Short Term Goal 1 (Week 1): = LTGs due to anticipated LOS  Skilled Therapeutic Interventions/Progress Updates:    Pt sitting EOB upon arrival, willing to participate with PT but needing to take it easy due to fatigue. Pt ambulating to gym using rw. Performing Franklyn LorBerg Balace Test with rest breaks as needed. Pt able to then ambulate to small gym to complete car transfer (supervision level). Ambulation then performed from gym back to room with standing breaks as needed. Pt able to ambulate the last 45 ft without an assistive device and supervision assist for safety. Upon returning to room the pt confirmed feeling good about D/C and denied any questions or concerns. Pt in recliner with all needs within reach and family present.   Therapy Documentation Precautions:  Precautions Precautions: None Restrictions Weight Bearing Restrictions: No  Pain:  Pt denies pain, states his legs are sore.     Balance: Berg Balance Test Sit to Stand: Able to stand  independently using hands Standing Unsupported: Able to stand safely 2 minutes Sitting with Back Unsupported but Feet Supported on Floor or Stool: Able to sit safely and securely 2 minutes Stand to Sit: Controls descent by using hands Transfers: Able to transfer safely, minor use of hands Standing Unsupported with Eyes Closed: Able to stand 10 seconds safely Standing Ubsupported with Feet Together: Able to place feet together independently and stand 1 minute safely From Standing, Reach Forward with Outstretched Arm: Can reach confidently >25 cm (10") From Standing Position, Pick up Object from Floor: Able to pick up shoe, needs supervision From Standing Position, Turn to Look Behind Over each Shoulder: Looks  behind from both sides and weight shifts well Turn 360 Degrees: Able to turn 360 degrees safely in 4 seconds or less Standing Unsupported, Alternately Place Feet on Step/Stool: Able to complete 4 steps without aid or supervision Standing Unsupported, One Foot in Front: Able to plae foot ahead of the other independently and hold 30 seconds Standing on One Leg: Tries to lift leg/unable to hold 3 seconds but remains standing independently Total Score: 47   See Function Navigator for Current Functional Status.   Therapy/Group: Individual Therapy  Christiane HaBenjamin J. Riyanna Crutchley, PT, CSCS Pager [phone] Office [phone]  06/19/2016, 4:04 PM

## 2016-06-19 NOTE — Progress Notes (Signed)
Social Work  Discharge Note  The overall goal for the admission was met for:   Discharge location: Yes - home with wife and adult son who can provide intermittent assistance  Length of Stay: Yes - 10 days  Discharge activity level: Yes - modified independent  Home/community participation: Yes  Services provided included: MD, RD, PT, OT, RN, TR, Pharmacy and [place]: [place]  Follow-up services arranged: Home Health: RN, PT via [facility], DME: rolling walker, 3n1 commode and tub bench via [facility] and Patient/Family has no preference for HH/DME agencies  Comments (or additional information):  Patient/Family verbalized understanding of follow-up arrangements: Yes  Individual responsible for coordination of the follow-up plan: pt  Confirmed correct DME delivered: Ivanka Kirshner 06/19/2016    Jaedyn Marrufo

## 2016-06-19 NOTE — Discharge Summary (Signed)
Discharge summary job [id number]

## 2016-06-19 NOTE — Discharge Instructions (Signed)
Inpatient Rehab Discharge Instructions  Connor Vargas Discharge date and time: No discharge date for patient encounter.   Activities/Precautions/ Functional Status: Activity: activity as tolerated Diet: regular diet Wound Care: none needed Functional status:  ___ No restrictions     ___ Walk up steps independently ___ 24/7 supervision/assistance   ___ Walk up steps with assistance ___ Intermittent supervision/assistance  ___ Bathe/dress independently ___ Walk with walker     _x__ Bathe/dress with assistance ___ Walk Independently    ___ Shower independently ___ Walk with assistance    ___ Shower with assistance ___ No alcohol     ___ Return to work/school ________   COMMUNITY REFERRALS UPON DISCHARGE:    Home Health:   PT     RN                     Agency:  [facility] Phone: [phone]   Medical Equipment/Items Ordered: walker, commode and tub bench                                                     Agency/Supplier: [facility] [phone]      Special Instructions: Follow-up with rheumatology services Dr.ZIOKOLWSKA 07/03/2016. Address [phone] phone number [phone]   My questions have been answered and I understand these instructions. I will adhere to these goals and the provided educational materials after my discharge from the hospital.  Patient/Caregiver Signature _______________________________ Date __________  Clinician Signature _______________________________________ Date __________  Please bring this form and your medication list with you to all your follow-up doctor's appointments.

## 2016-06-19 NOTE — Discharge Summary (Signed)
Occupational Therapy Discharge Summary  Patient Details  Name: Connor Vargas MRN: [id number] Date of Birth: [birth year]  Patient has met 9 of 9 long term goals due to improved activity tolerance, improved balance, ability to compensate for deficits and improved awareness.  Patient to discharge at overall Modified Independent level.  Patient's care partner is independent to provide the necessary cognitive assistance at discharge.    All goals met  Recommendation:  Patient will benefit from ongoing skilled OT services in home health setting to continue to advance functional skills in the area of iADL.  Equipment: Dance movement psychotherapist and BSC  Reasons for discharge: treatment goals met  Patient/family agrees with progress made and goals achieved: Yes  Skilled Therapeutic Intervention:  Pt seen for scheduled therapy with c/o dizziness and nausea due to not having BM (per pt report) in 4 days. RN notified and provided medication. Pt reported that he wanted to try with tx with a few minutes of rest. During that time, discussed home DME, home therapies, and routine planning for ADLs (with spouse input).  Pt then verbalized that he still did not feel physically able to participate in tx. Plans made to return when pt feels able to engage in tx.   2nd Session 1:1 tx (58 min) Pt engaged in session focusing on ADL reevaluation, functional ambulation, endurance, and UB strengthening. Pt reported feeling better than he did when OT arrived earlier, agreeable to session. He ambulated with RW at Mod I level down to tub room and completed transfer with supervision and spouse present for educating on technique. Bathing/dressing completed at Mod I level with extra time on tub bench after IV was covered. Pt then ambulated back to room. Oral care/grooming tasks completed standing at sink without cues. He demonstrated carryover of UB exercises with yellow theraband, required min cues for technique carryover. Educated him  on incorporating exercises into daily routine. Pt left EOB at end of session to speak with MD.  19 minutes missed overall.   OT Discharge Precautions/Restrictions  Precautions Precautions: None Restrictions Weight Bearing Restrictions: No   Pain: in abdomen, pt medicated and provided rest breaks during sessions    ADL ADL ADL Comments: Please see functional navigator for ADL status Vision/Perception  Vision- History Baseline Vision/History: No visual deficits Patient Visual Report: No change from baseline Vision- Assessment Vision Assessment?: No apparent visual deficits  Cognition Overall Cognitive Status: Within Functional Limits for tasks assessed Arousal/Alertness: Awake/alert Orientation Level: Oriented X4 Memory: Appears intact Awareness: Appears intact Problem Solving: Appears intact Safety/Judgment: Appears intact Sensation Sensation Light Touch: Appears Intact Stereognosis: Not tested Hot/Cold: Appears Intact Proprioception: Appears Intact Coordination Gross Motor Movements are Fluid and Coordinated: Yes Fine Motor Movements are Fluid and Coordinated: Yes Motor  Motor Motor - Discharge Observations:  (improved strength and endurance since time of eval) Mobility  Transfers Transfers: Sit to Stand;Stand to Sit Sit to Stand: 6: Modified independent (Device/Increase time)  Trunk/Postural Assessment  Cervical Assessment Cervical Assessment: Within Functional Limits Thoracic Assessment Thoracic Assessment: Within Functional Limits Lumbar Assessment Lumbar Assessment: Within Functional Limits  Balance Balance Balance Assessed: Yes Dynamic Sitting Balance Dynamic Sitting - Level of Assistance: 6: Modified independent (Device/Increase time) Dynamic Standing Balance Dynamic Standing - Balance Support: No upper extremity supported Dynamic Standing - Level of Assistance: 6: Modified independent (Device/Increase time) Extremity/Trunk Assessment RUE  Assessment RUE Assessment: Within Functional Limits LUE Assessment LUE Assessment: Within Functional Limits (3+/5)   See Function Navigator for Current Functional Status.   A   06/19/2016, 7:19 PM

## 2016-06-19 NOTE — Progress Notes (Signed)
Physical Therapy Discharge Summary  Patient Details  Name: Connor Vargas MRN: [id number] Date of Birth: [birth year]  Today's Date: 06/19/2016 PT Individual Time: 0830-0923 PT Individual Time Calculation (min): 53 min   Pt participated in skilled PT session, complaining of stomach pain due to no bowel movement x 3 days, RN aware, pt able to participate with multiple prolonged rest breaks. Pt able to perform gait in home and controlled environments with mod I with RW.  Furniture transfers with mod I and increased time.  [place] exercise program performed with 2# wts.  Stair negotiation x 4 stairs x 2 with 1 handrail with prolonged rest in between due to LE fatigue.  Pt is frustrated about his medical condition but states that he feels safe to d/c from a mobility stand point.  Pt's wife and sister present for sessions and able to provide intermittent supervision to pt at home.   Patient has met 7 of 7 long term goals due to improved activity tolerance, improved balance, improved postural control, increased strength and ability to compensate for deficits.  Patient to discharge at an ambulatory level Modified Independent.   Patient's care partner is independent to provide the necessary intermittent supervision assistance at discharge.  Reasons goals not met: n/a  Recommendation:  Patient will benefit from ongoing skilled PT services in home health setting to continue to advance safe functional mobility, address ongoing impairments in balance, strength, gait, and minimize fall risk.  Equipment: RW  Reasons for discharge: treatment goals met and discharge from hospital  Patient/family agrees with progress made and goals achieved: Yes  PT Discharge Precautions/Restrictions Precautions Precautions: None Restrictions Weight Bearing Restrictions: No Pain Pain Assessment Pain Assessment: No/denies pain Pain Score: 0-No pain Faces Pain Scale: Hurts little more Pain Type: Acute pain Pain Location:   (stomach) Pain Descriptors / Indicators: Discomfort Pain Intervention(s): Repositioned;RN made aware  Cognition Overall Cognitive Status: Within Functional Limits for tasks assessed Orientation Level: Oriented X4 Sensation Sensation Light Touch: Appears Intact Proprioception: Appears Intact Coordination Gross Motor Movements are Fluid and Coordinated: Yes Fine Motor Movements are Fluid and Coordinated: Yes Motor  Motor Motor - Discharge Observations: improving weakness   Trunk/Postural Assessment  Cervical Assessment Cervical Assessment:  (fwd head) Thoracic Assessment Thoracic Assessment:  (mild kyphosis) Lumbar Assessment Lumbar Assessment:  (posterior pelvic tilt) Postural Control Righting Reactions: improving  Balance Dynamic Standing Balance Dynamic Standing - Comments: mod I Extremity Assessment      RLE Assessment RLE Assessment:  (grossly 3/5) LLE Assessment LLE Assessment:  (grossly 3/5)   See Function Navigator for Current Functional Status.  DONAWERTH,KAREN 06/19/2016, 9:21 AM

## 2016-06-20 LAB — CBC
HEMATOCRIT: 38.8 % — AB (ref 39.0–52.0)
HEMOGLOBIN: 12.4 g/dL — AB (ref 13.0–17.0)
MCH: 28.8 pg (ref 26.0–34.0)
MCHC: 32 g/dL (ref 30.0–36.0)
MCV: 90.2 fL (ref 78.0–100.0)
Platelets: 199 10*3/uL (ref 150–400)
RBC: 4.3 MIL/uL (ref 4.22–5.81)
RDW: 15.7 % — AB (ref 11.5–15.5)
WBC: 11.9 10*3/uL — AB (ref 4.0–10.5)

## 2016-06-20 LAB — COMPREHENSIVE METABOLIC PANEL
ALK PHOS: 47 U/L (ref 38–126)
ALT: 111 U/L — ABNORMAL HIGH (ref 17–63)
ANION GAP: 6 (ref 5–15)
AST: 101 U/L — ABNORMAL HIGH (ref 15–41)
Albumin: 3.1 g/dL — ABNORMAL LOW (ref 3.5–5.0)
BILIRUBIN TOTAL: 0.7 mg/dL (ref 0.3–1.2)
BUN: 23 mg/dL — ABNORMAL HIGH (ref 6–20)
CALCIUM: 8.7 mg/dL — AB (ref 8.9–10.3)
CO2: 33 mmol/L — ABNORMAL HIGH (ref 22–32)
Chloride: 98 mmol/L — ABNORMAL LOW (ref 101–111)
Creatinine, Ser: 0.67 mg/dL (ref 0.61–1.24)
GFR calc non Af Amer: >60 mL/min (ref >60)
Glucose, Bld: 84 mg/dL (ref 65–99)
Potassium: 4.3 mmol/L (ref 3.5–5.1)
SODIUM: 137 mmol/L (ref 135–145)
Total Protein: 5.5 g/dL — ABNORMAL LOW (ref 6.5–8.1)

## 2016-06-20 LAB — CK: Total CK: 1543 U/L — ABNORMAL HIGH (ref 49–397)

## 2016-06-20 MED ORDER — PANTOPRAZOLE SODIUM 40 MG PO TBEC: 40.0000 mg | DELAYED_RELEASE_TABLET | Freq: Every day | ORAL | 1 refills | Status: AC

## 2016-06-20 MED ORDER — TRAZODONE HCL 50 MG PO TABS: 50.0000 mg | ORAL_TABLET | Freq: Every evening | ORAL | 1 refills | Status: AC | PRN

## 2016-06-20 MED ORDER — PREDNISONE 20 MG PO TABS: 60.0000 mg | ORAL_TABLET | Freq: Every day | ORAL | 0 refills | Status: AC

## 2016-06-20 MED ORDER — WITCH HAZEL-GLYCERIN EX PADS: MEDICATED_PAD | Freq: Three times a day (TID) | CUTANEOUS | 12 refills | Status: AC

## 2016-06-20 MED ORDER — POTASSIUM CHLORIDE CRYS ER 20 MEQ PO TBCR: 20.0000 meq | EXTENDED_RELEASE_TABLET | Freq: Every day | ORAL | 1 refills | Status: AC

## 2016-06-20 MED ORDER — OXYCODONE HCL 5 MG PO TABS: 5.0000 mg | ORAL_TABLET | ORAL | 0 refills | Status: AC | PRN

## 2016-06-20 NOTE — Progress Notes (Signed)
Patient discharged. Accompanied by two family members. Prescriptions/discharge instructions in his possession. Escorted via wheelchair to lobby by nurse tech.

## 2016-06-20 NOTE — Progress Notes (Signed)
Connor BarrenRadovan Vargas is a 56 y.o. male May 01, 1960 [id number]  Subjective: No new problems. Slept well. Feeling OK. C/o hemorrhoids  Objective: Vital signs in last 24 hours: Temp:  [97.9 F (36.6 C)] 97.9 F (36.6 C) (03/16 1455) Pulse Rate:  [74] 74 (03/16 1455) Resp:  [17] 17 (03/16 1455) BP: (107)/(65) 107/65 (03/16 1455) SpO2:  [99 %] 99 % (03/16 1455) Weight change:  Last BM Date: 06/17/16  Intake/Output from previous day: 03/16 0701 - 03/17 0700 In: 480 [P.O.:480] Out: 1600 [Urine:1600] Last cbgs: CBG (last 3)  No results for input(s): GLUCAP in the last 72 hours.   Physical Exam General: No apparent distress   HEENT: not dry Lungs: Normal effort. Lungs clear to auscultation, no crackles or wheezes. Cardiovascular: Regular rate and rhythm, no edema Abdomen: S/NT/ND; BS(+) Musculoskeletal:  unchanged Neurological: No new neurological deficits Wounds: N/A    Skin: clear   Mental state: Alert, oriented, cooperative    Lab Results: BMET    Component Value Date/Time   NA 137 06/20/2016 0808   K 4.3 06/20/2016 0808   CL 98 (L) 06/20/2016 0808   CO2 33 (H) 06/20/2016 0808   GLUCOSE 84 06/20/2016 0808   BUN 23 (H) 06/20/2016 0808   CREATININE 0.67 06/20/2016 0808   CALCIUM 8.7 (L) 06/20/2016 0808   GFRNONAA >60 06/20/2016 0808   GFRAA >60 06/20/2016 0808   CBC    Component Value Date/Time   WBC 11.9 (H) 06/20/2016 0808   RBC 4.30 06/20/2016 0808   HGB 12.4 (L) 06/20/2016 0808   HCT 38.8 (L) 06/20/2016 0808   PLT 199 06/20/2016 0808   MCV 90.2 06/20/2016 0808   MCH 28.8 06/20/2016 0808   MCHC 32.0 06/20/2016 0808   RDW 15.7 (H) 06/20/2016 0808   LYMPHSABS 1.7 06/17/2016 0431   MONOABS 1.1 (H) 06/17/2016 0431   EOSABS 0.0 06/17/2016 0431   BASOSABS 0.0 06/17/2016 0431    Studies/Results: No results found.  Medications: I have reviewed the patient's current medications.  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 CHF. Lasix.  Cardiology f/u 3. CAD. Heart cath is planned 4. Hypothyroidism - on Synthroid 5. Hemorrhoids - Anusol     Length of stay, days: 10  Sonda PrimesAlex Plotnikov , MD 06/20/2016, 9:53 AM

## 2016-07-05 DEATH — deceased

## 2016-07-08 ENCOUNTER — Encounter
Payer: BLUE CROSS/BLUE SHIELD | Attending: Physical Medicine & Rehabilitation | Admitting: Physical Medicine & Rehabilitation

## 2016-07-09 DIAGNOSIS — M6281 Muscle weakness (generalized): Secondary | ICD-10-CM

## 2016-07-09 DIAGNOSIS — D649 Anemia, unspecified: Secondary | ICD-10-CM

## 2016-07-09 DIAGNOSIS — J189 Pneumonia, unspecified organism: Secondary | ICD-10-CM

## 2016-07-09 DIAGNOSIS — Z7982 Long term (current) use of aspirin: Secondary | ICD-10-CM

## 2016-07-09 DIAGNOSIS — M6282 Rhabdomyolysis: Secondary | ICD-10-CM

## 2016-07-09 DIAGNOSIS — I2510 Atherosclerotic heart disease of native coronary artery without angina pectoris: Secondary | ICD-10-CM

## 2016-07-09 DIAGNOSIS — M3329 Polymyositis with other organ involvement: Secondary | ICD-10-CM

## 2016-07-09 DIAGNOSIS — E039 Hypothyroidism, unspecified: Secondary | ICD-10-CM

## 2016-07-09 DIAGNOSIS — I5021 Acute systolic (congestive) heart failure: Secondary | ICD-10-CM

## 2018-01-15 IMAGING — DX DG CHEST 1V
1 series · 1 of 1 positions shown · non-contrast
Comparison: CT chest of 06/03/2016

CLINICAL DATA: Right-sided thoracentesis

EXAM:
CHEST 1 VIEW

[x chest ap]
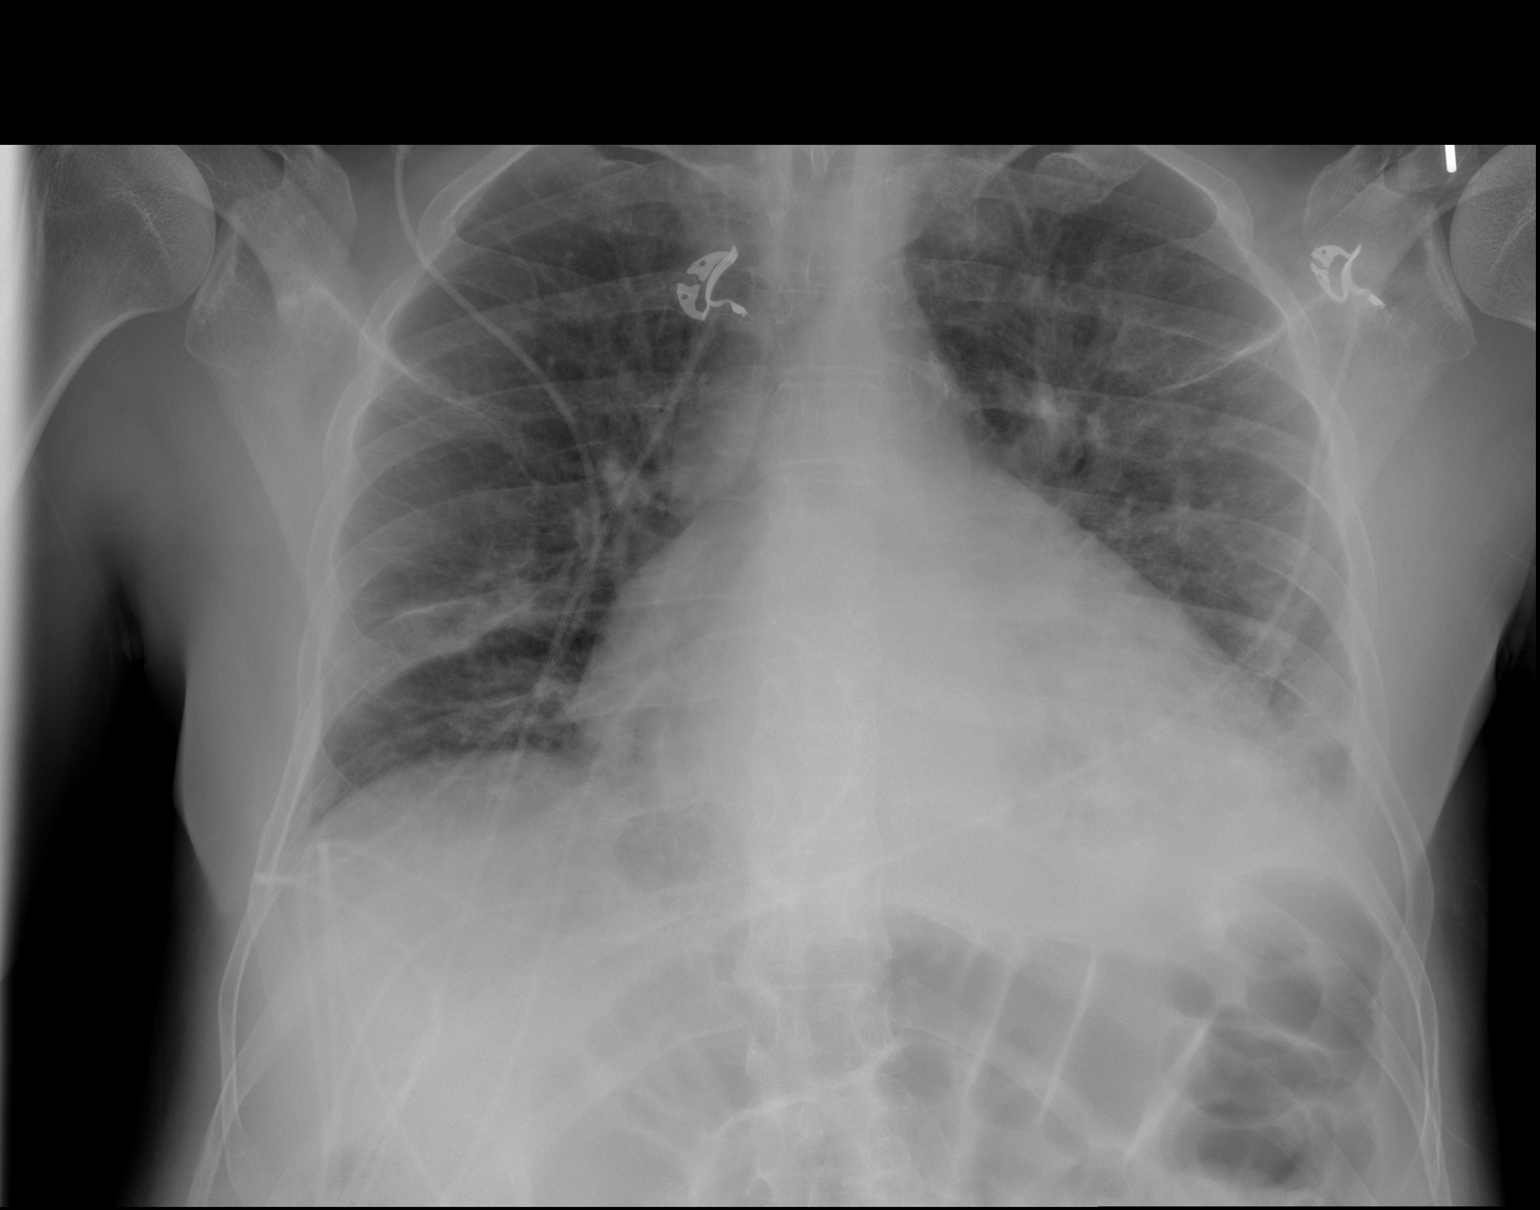

[1 of 1 positions shown; findings below may reference images not displayed]

FINDINGS: Much of the right pleural effusion has been evacuated by right
thoracentesis. No pneumothorax is seen. A small left effusion is
present with bibasilar atelectasis left-greater-than-right.
Cardiomegaly is stable.
IMPRESSION: Much of the right pleural effusion has been evacuated by right
thoracentesis. No pneumothorax is seen.

## 2018-01-16 IMAGING — DX DG CHEST 1V
1 series · 1 of 1 positions shown · non-contrast
Comparison: 06/04/2016

CLINICAL DATA: Followup left thoracentesis

EXAM:
CHEST 1 VIEW

[chest ap]
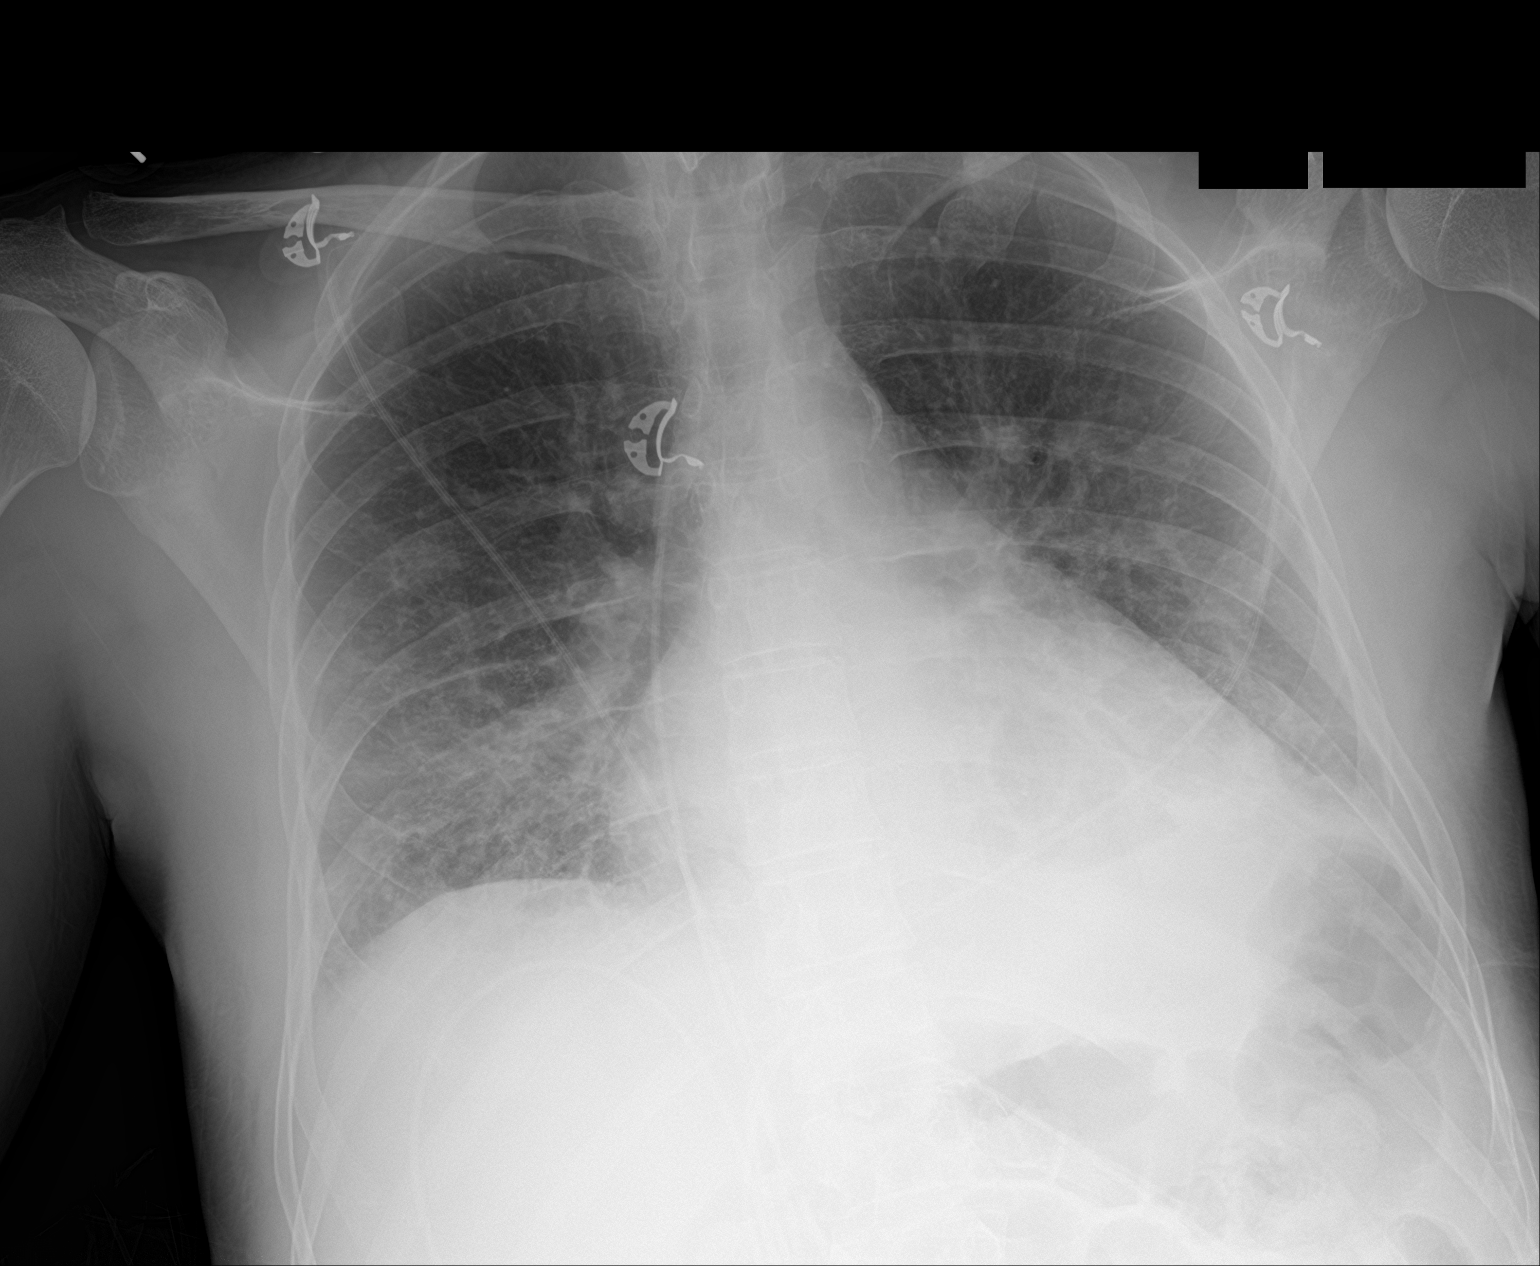

[1 of 1 positions shown; findings below may reference images not displayed]

FINDINGS: No pneumothorax. There is probably less pleural fluid on the left.
Small effusions persist with basilar atelectasis. There is venous
hypertension with interstitial edema.
IMPRESSION: Less pleural fluid on the left. No pneumothorax following
thoracentesis. Persistent basilar atelectasis and mild edema.

## 2018-01-28 IMAGING — DX DG CHEST 2V
2 series · 2 of 2 positions shown · non-contrast
Comparison: Portable chest x-ray June 05, 2016

CLINICAL DATA: Weakness, CHF, increased symptoms over the past 2
months with some clinical improvement recently.

EXAM:
CHEST  2 VIEW

[chest pa]
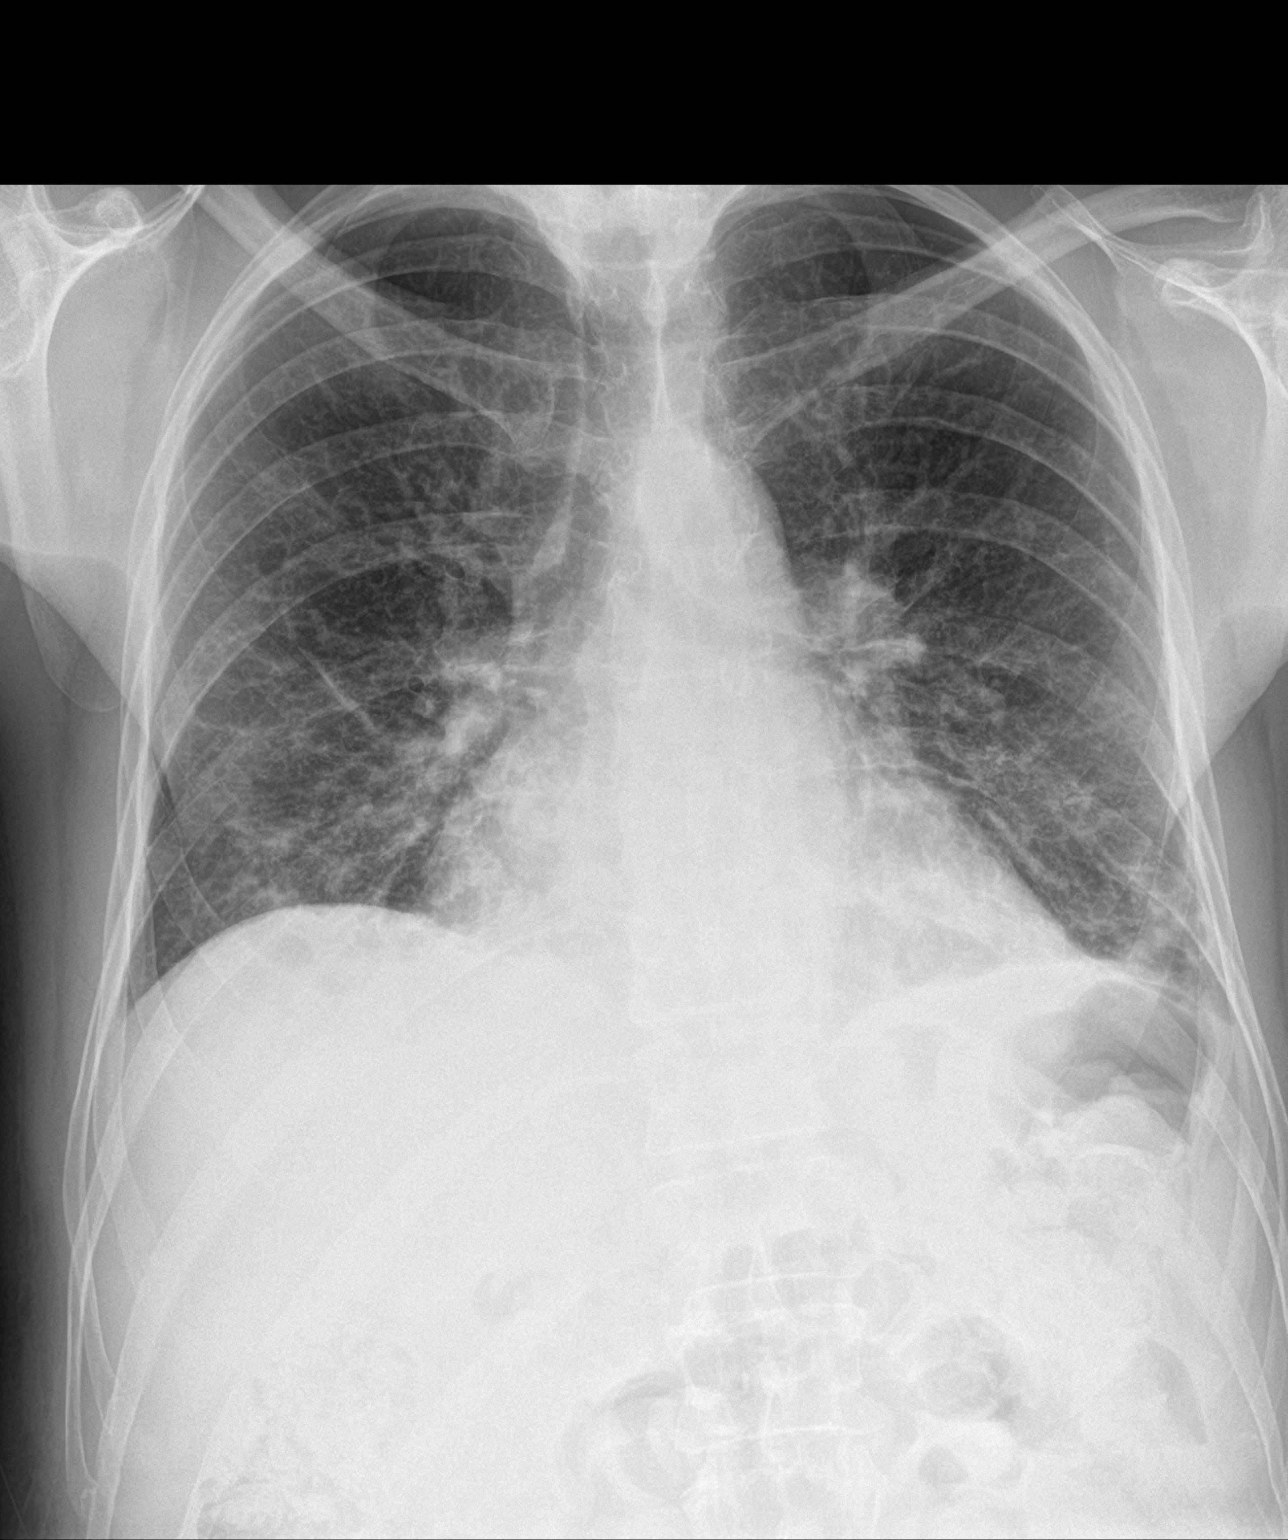

[chest lat]
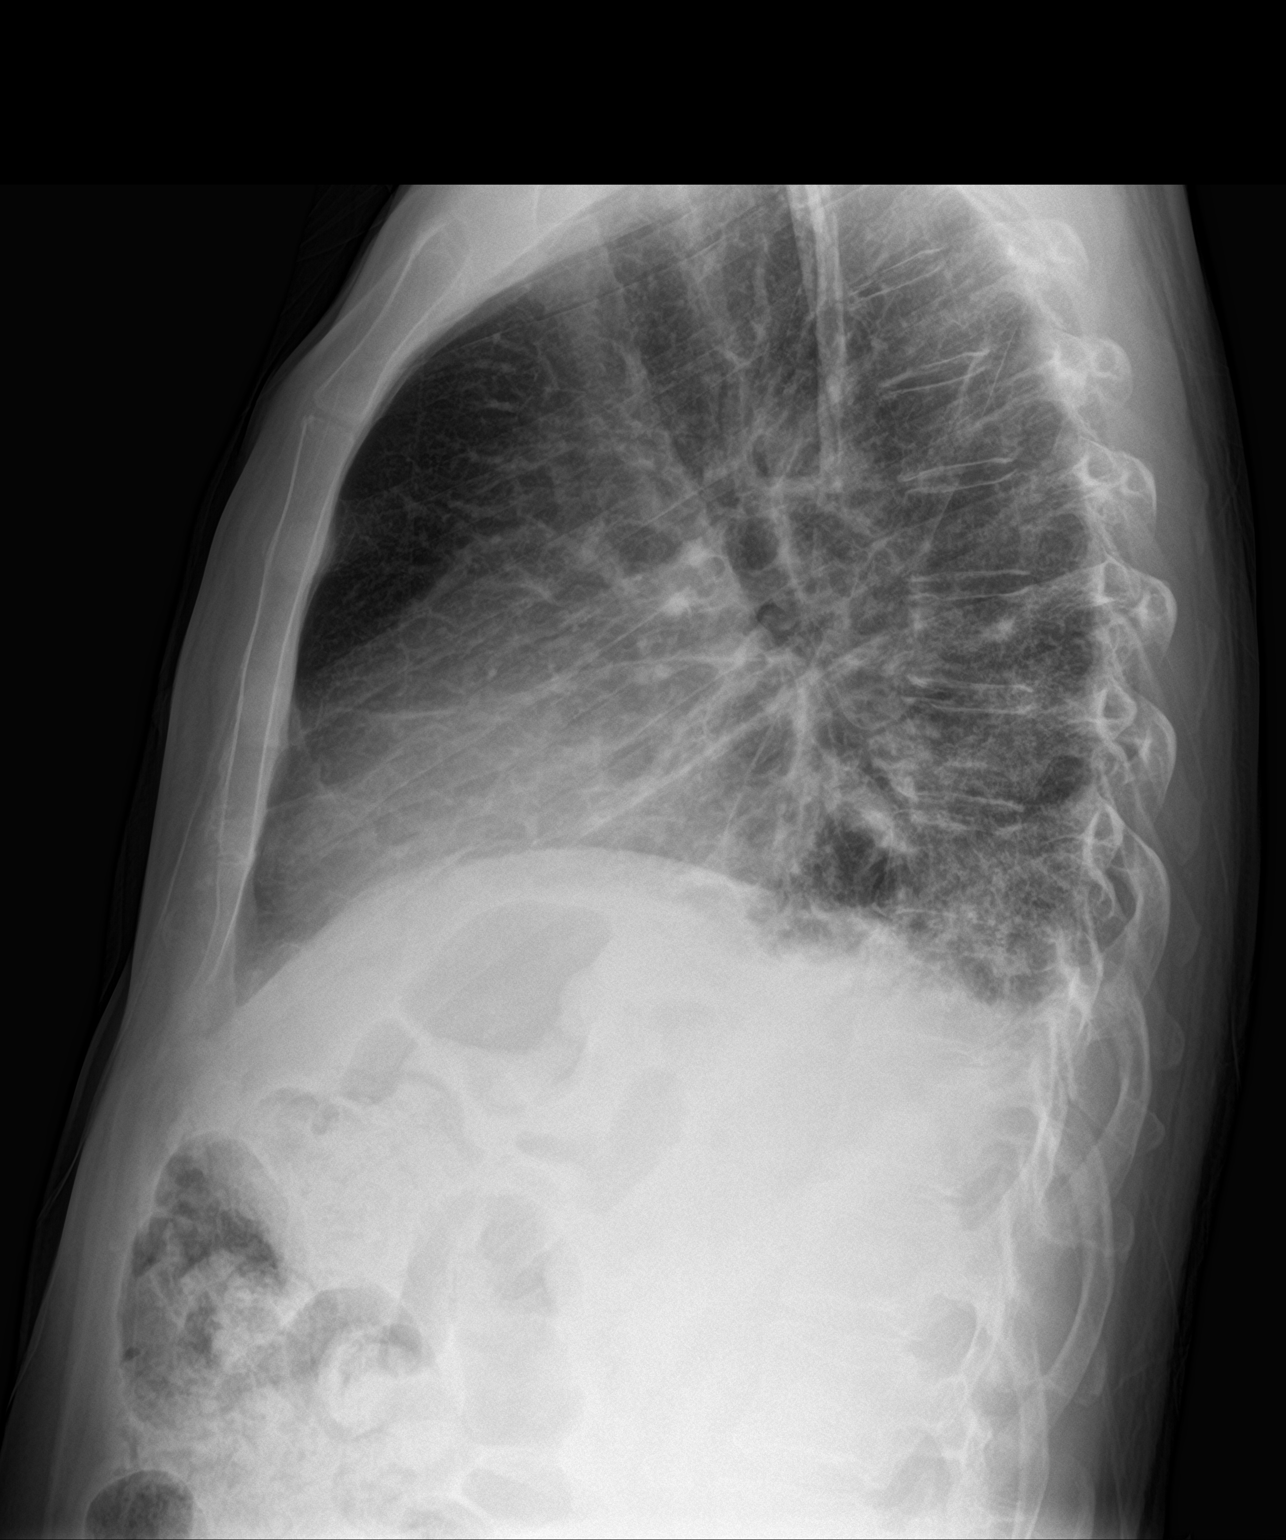

[2 of 2 positions shown; findings below may reference images not displayed]

FINDINGS: The lungs are adequately inflated. The interstitial markings remain
increased. There are patchy areas of airspace opacity at the lung
bases. The cardiac silhouette is enlarged. The pulmonary vascularity
is not engorged. There is calcification in the wall of the aortic
arch. A trace of pleural fluid blunts the posterior costophrenic
angles. The bony structures exhibit no acute abnormalities.
IMPRESSION: Chronic bronchitic changes and likely basilar bronchiectasis or
atelectasis. No discrete alveolar pneumonia. The significant pleural
effusions seen on May 2016 CT scans are not evident today. Only
a small amount of pleural fluid blunts the posterior costophrenic
angles.

Mild cardiomegaly without significant pulmonary vascular congestion.

Thoracic aortic atherosclerosis.

## 2018-08-05 IMAGING — US US THORACENTESIS ASP PLEURAL SPACE W/IMG GUIDE
1 series · 5 of 5 positions shown · non-contrast
Comparison: none

INDICATION: New onset decompensated systolic congestive heart with an ejection
fraction of 25% and new bilateral pleural effusions. Request is made
for diagnostic and therapeutic thoracentesis.

[Series 1: us thoracentesis asp pleural space w/img guide · 0.28mm/px · 5 of 5 slices shown]
[im 1/5]
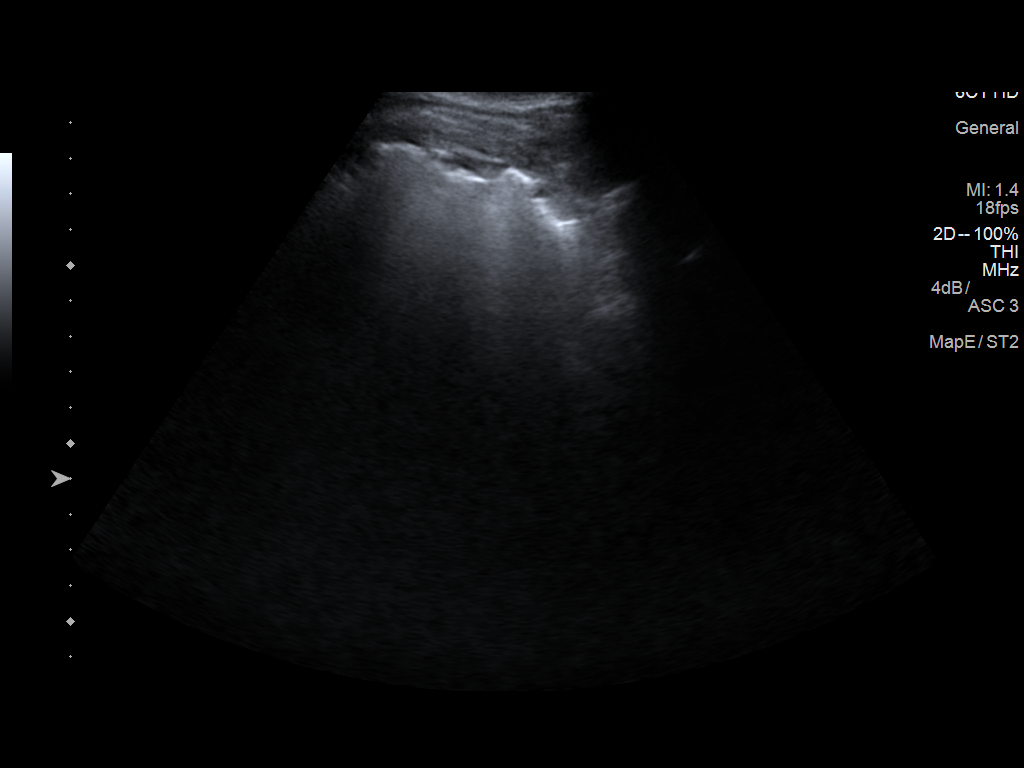
[im 2/5]
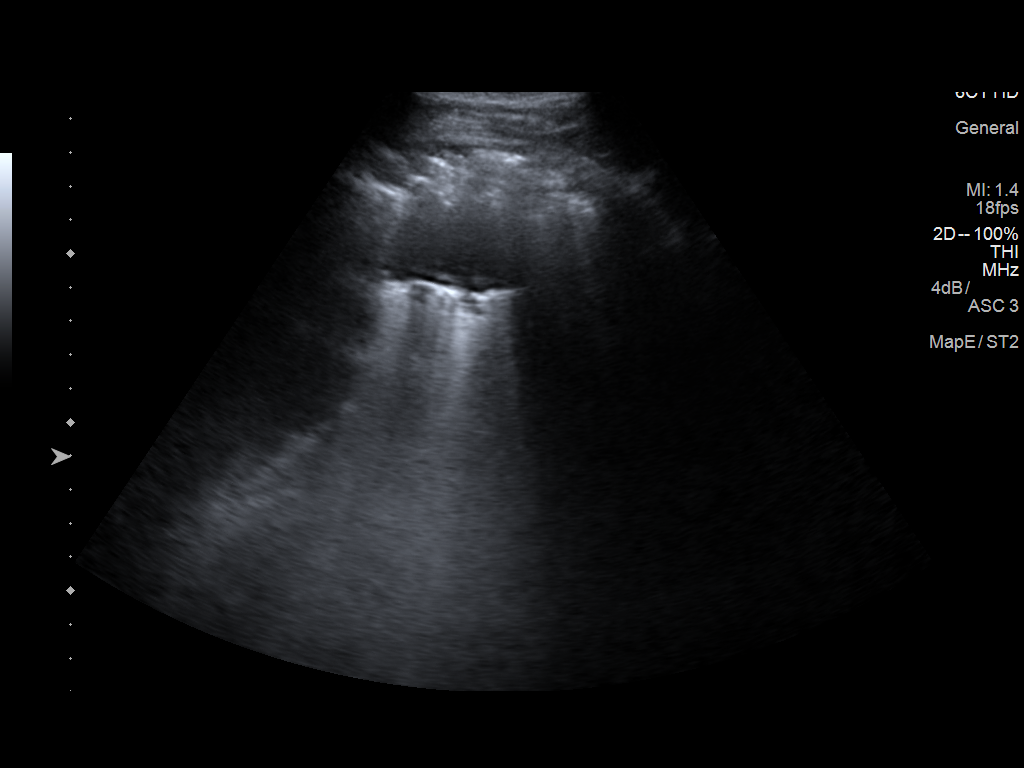
[im 3/5]
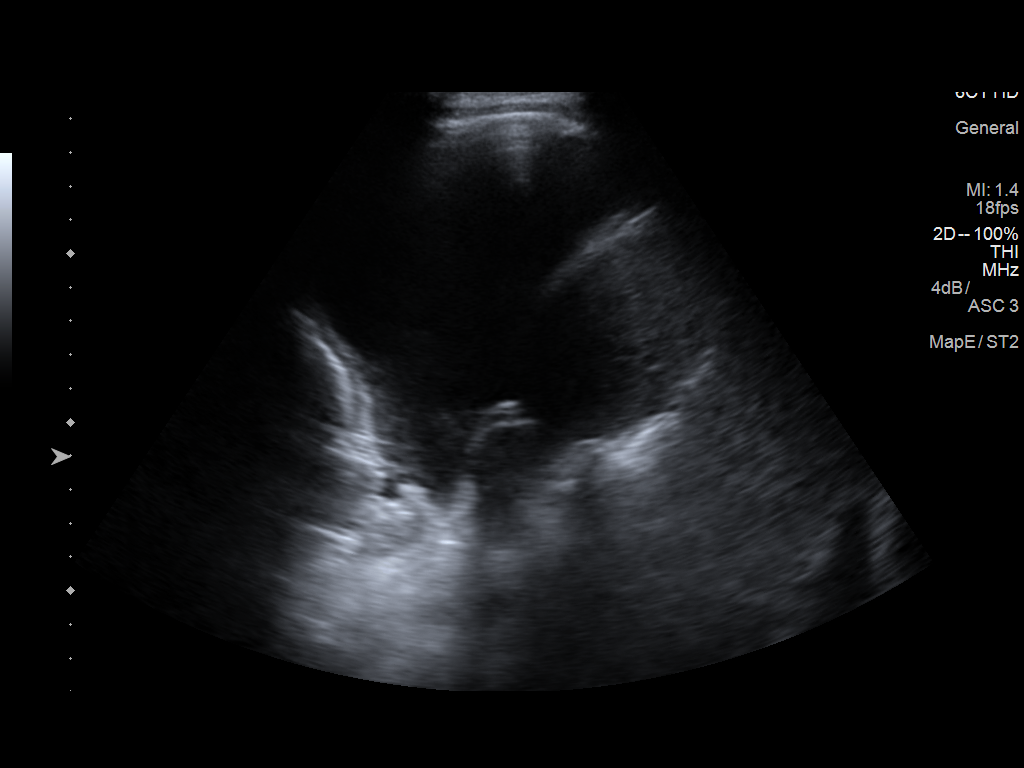
[im 4/5]
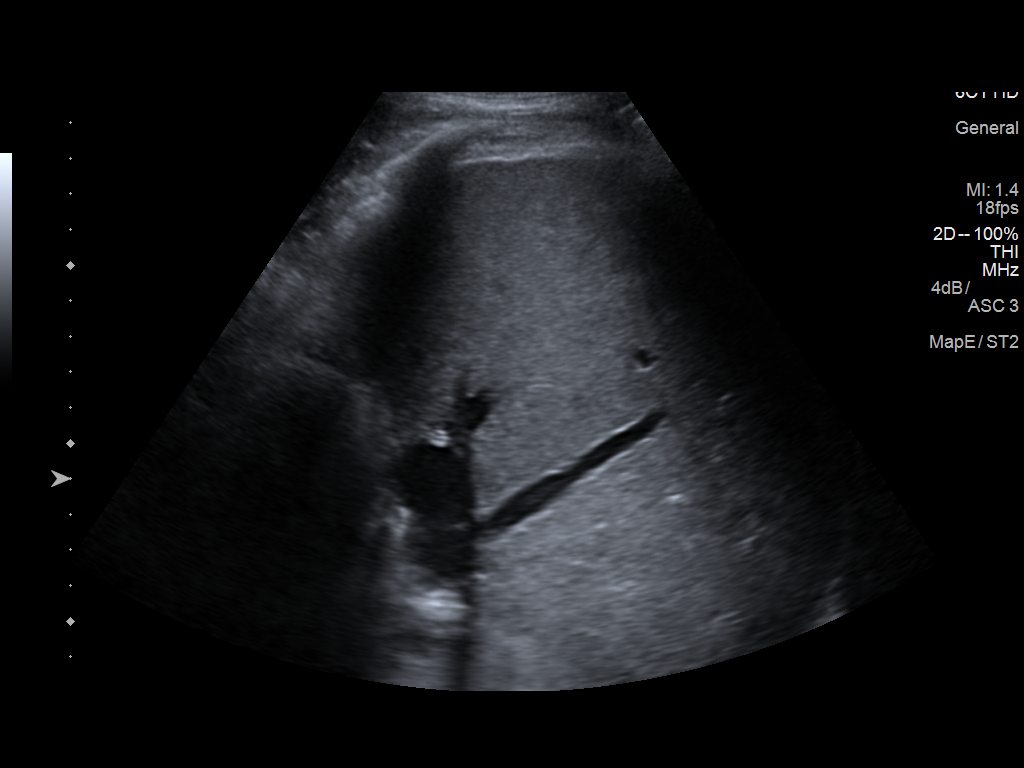
[im 5/5]
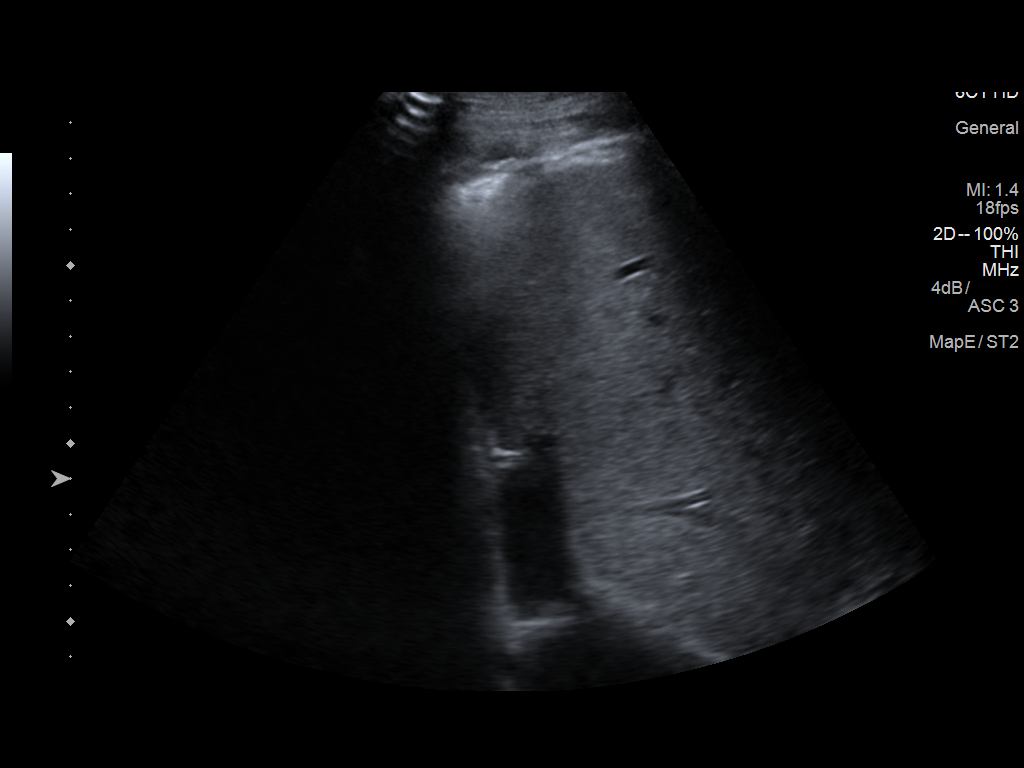

[5 of 5 positions shown; findings below may reference images not displayed]

EXAM:
ULTRASOUND GUIDED DIAGNOSTIC AND THERAPEUTIC THORACENTESIS

MEDICATIONS:
1% lidocaine.

COMPLICATIONS:
None immediate.

PROCEDURE:
An ultrasound guided thoracentesis was thoroughly discussed with the
patient and questions answered. The benefits, risks, alternatives
and complications were also discussed. The patient understands and
wishes to proceed with the procedure. Written consent was obtained.

Ultrasound was performed to localize and mark an adequate pocket of
fluid in the right chest. The area was then prepped and draped in
the normal sterile fashion. 1% Lidocaine was used for local
anesthesia. Under ultrasound guidance a Safe-T-Centesis catheter was
introduced. Thoracentesis was performed. The catheter was removed
and a dressing applied.
FINDINGS: A total of approximately 0.8 L of yellow fluid was removed. Samples
were sent to the laboratory as requested by the clinical team.
IMPRESSION: Successful ultrasound guided right thoracentesis yielding 0.8 L of
pleural fluid.

## 2018-08-06 IMAGING — US US THORACENTESIS ASP PLEURAL SPACE W/IMG GUIDE
1 series · 1 of 1 positions shown · non-contrast
Comparison: None.

MEDICATIONS:
10 cc 1% lidocaine

COMPLICATIONS:
None immediate.

INDICATION: Symptomatic left sided pleural effusion

EXAM:
US THORACENTESIS ASP PLEURAL SPACE W/IMG GUIDE
TECHNIQUE: Informed written consent was obtained from the patient after a
discussion of the risks, benefits and alternatives to treatment. A
timeout was performed prior to the initiation of the procedure.

[Series 1: us thoracentesis asp pleural space w/img guide · 0.25mm/px · 1 of 1 slices shown]
[im 1/1]
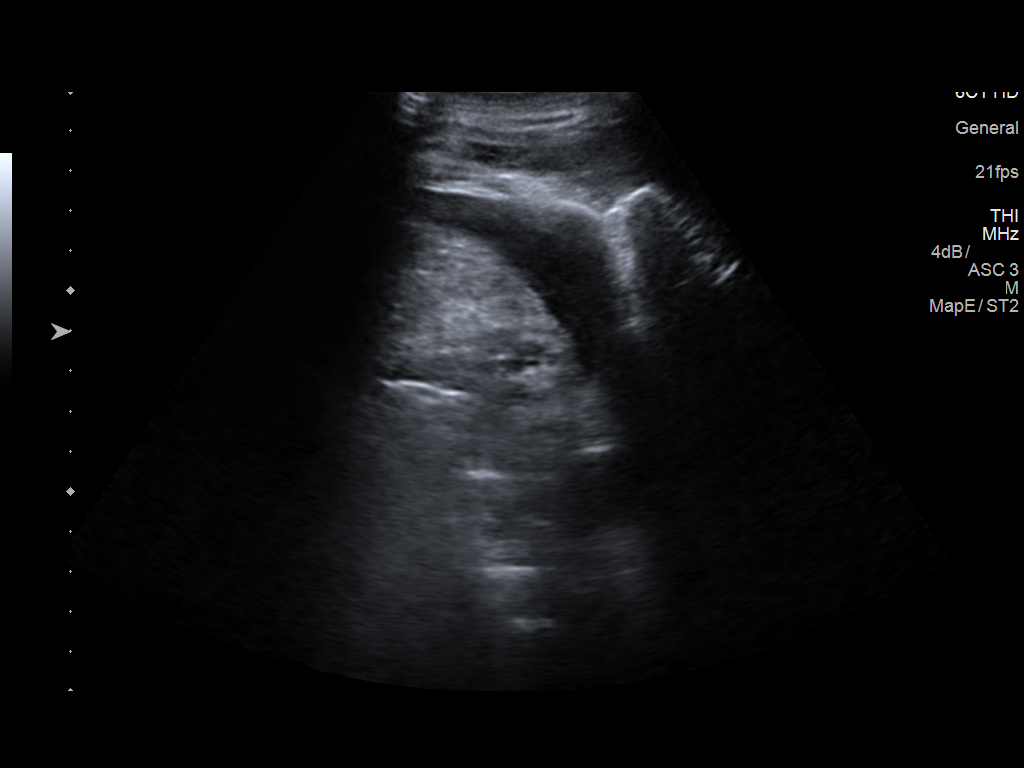

[1 of 1 positions shown; findings below may reference images not displayed]

Initial ultrasound scanning demonstrates a left pleural effusion.
The lower chest was prepped and draped in the usual sterile fashion.
1% lidocaine was used for local anesthesia.

Under direct ultrasound guidance, a 19 gauge, 7-cm, Yueh catheter
was introduced. An ultrasound image was saved for documentation
purposes. The thoracentesis was performed. The catheter was removed
and a dressing was applied. The patient tolerated the procedure well
without immediate post procedural complication. The patient was
escorted to have an upright chest radiograph.
FINDINGS: A total of approximately 500 cc of clear yellow fluid was removed.
IMPRESSION: Successful ultrasound-guided left sided thoracentesis yielding
liters of pleural fluid.

Read by

Nazareth Jumper
